# Patient Record
Sex: Male | Born: 1955 | Race: White | Hispanic: No | Marital: Married | Smoking: Former smoker
Health system: Southern US, Community
[De-identification: ages and names within clinical notes are randomized; demographics above are authoritative.]

## PROBLEM LIST (undated history)

## (undated) DIAGNOSIS — B9681 Helicobacter pylori [H. pylori] as the cause of diseases classified elsewhere: Secondary | ICD-10-CM

## (undated) DIAGNOSIS — T8859XA Other complications of anesthesia, initial encounter: Secondary | ICD-10-CM

## (undated) DIAGNOSIS — E785 Hyperlipidemia, unspecified: Secondary | ICD-10-CM

## (undated) DIAGNOSIS — M5417 Radiculopathy, lumbosacral region: Secondary | ICD-10-CM

## (undated) DIAGNOSIS — K260 Acute duodenal ulcer with hemorrhage: Secondary | ICD-10-CM

## (undated) DIAGNOSIS — J189 Pneumonia, unspecified organism: Secondary | ICD-10-CM

## (undated) DIAGNOSIS — D62 Acute posthemorrhagic anemia: Secondary | ICD-10-CM

## (undated) DIAGNOSIS — Z79891 Long term (current) use of opiate analgesic: Secondary | ICD-10-CM

## (undated) DIAGNOSIS — F1190 Opioid use, unspecified, uncomplicated: Secondary | ICD-10-CM

## (undated) DIAGNOSIS — B9689 Other specified bacterial agents as the cause of diseases classified elsewhere: Secondary | ICD-10-CM

## (undated) DIAGNOSIS — J45909 Unspecified asthma, uncomplicated: Secondary | ICD-10-CM

## (undated) DIAGNOSIS — Z1211 Encounter for screening for malignant neoplasm of colon: Secondary | ICD-10-CM

## (undated) DIAGNOSIS — I10 Essential (primary) hypertension: Secondary | ICD-10-CM

## (undated) DIAGNOSIS — K269 Duodenal ulcer, unspecified as acute or chronic, without hemorrhage or perforation: Secondary | ICD-10-CM

## (undated) DIAGNOSIS — G4730 Sleep apnea, unspecified: Secondary | ICD-10-CM

## (undated) DIAGNOSIS — M1990 Unspecified osteoarthritis, unspecified site: Secondary | ICD-10-CM

## (undated) DIAGNOSIS — N6489 Other specified disorders of breast: Secondary | ICD-10-CM

## (undated) DIAGNOSIS — A048 Other specified bacterial intestinal infections: Secondary | ICD-10-CM

## (undated) DIAGNOSIS — K2970 Gastritis, unspecified, without bleeding: Secondary | ICD-10-CM

## (undated) HISTORY — DX: Duodenal ulcer, unspecified as acute or chronic, without hemorrhage or perforation: K26.9

## (undated) HISTORY — DX: Acute duodenal ulcer with hemorrhage: K26.0

## (undated) HISTORY — DX: Essential (primary) hypertension: I10

## (undated) HISTORY — DX: Other specified bacterial agents as the cause of diseases classified elsewhere: B96.89

## (undated) HISTORY — PX: COLONOSCOPY: SHX174

## (undated) HISTORY — DX: Hyperlipidemia, unspecified: E78.5

## (undated) HISTORY — DX: Radiculopathy, lumbosacral region: M54.17

## (undated) HISTORY — DX: Other specified bacterial intestinal infections: A04.8

## (undated) HISTORY — DX: Opioid use, unspecified, uncomplicated: F11.90

## (undated) HISTORY — DX: Encounter for screening for malignant neoplasm of colon: Z12.11

## (undated) HISTORY — DX: Unspecified osteoarthritis, unspecified site: M19.90

## (undated) HISTORY — DX: Helicobacter pylori (H. pylori) as the cause of diseases classified elsewhere: K29.70

## (undated) HISTORY — DX: Helicobacter pylori (H. pylori) as the cause of diseases classified elsewhere: B96.81

## (undated) HISTORY — DX: Long term (current) use of opiate analgesic: Z79.891

---

## 2005-08-08 ENCOUNTER — Emergency Department: Payer: Self-pay | Admitting: Emergency Medicine

## 2005-08-10 ENCOUNTER — Emergency Department: Payer: Self-pay | Admitting: Emergency Medicine

## 2008-05-12 ENCOUNTER — Ambulatory Visit: Payer: Self-pay | Admitting: Orthopedic Surgery

## 2008-05-19 ENCOUNTER — Ambulatory Visit: Payer: Self-pay | Admitting: Orthopedic Surgery

## 2008-09-23 ENCOUNTER — Ambulatory Visit: Payer: Self-pay | Admitting: Neurology

## 2008-09-30 ENCOUNTER — Ambulatory Visit: Payer: Self-pay | Admitting: Pain Medicine

## 2008-10-21 ENCOUNTER — Ambulatory Visit: Payer: Self-pay | Admitting: Pain Medicine

## 2008-10-29 ENCOUNTER — Ambulatory Visit: Payer: Self-pay | Admitting: Pain Medicine

## 2008-11-11 ENCOUNTER — Ambulatory Visit: Payer: Self-pay | Admitting: Pain Medicine

## 2008-11-26 ENCOUNTER — Ambulatory Visit: Payer: Self-pay | Admitting: Pain Medicine

## 2009-04-08 ENCOUNTER — Ambulatory Visit: Payer: Self-pay | Admitting: Physician Assistant

## 2009-12-12 HISTORY — PX: ROTATOR CUFF REPAIR: SHX139

## 2010-04-13 ENCOUNTER — Ambulatory Visit: Payer: Self-pay | Admitting: Family Medicine

## 2010-04-18 ENCOUNTER — Ambulatory Visit: Payer: Self-pay | Admitting: Neurology

## 2010-05-12 ENCOUNTER — Ambulatory Visit: Payer: Self-pay | Admitting: Gastroenterology

## 2010-08-11 ENCOUNTER — Emergency Department: Payer: Self-pay | Admitting: Emergency Medicine

## 2011-12-13 HISTORY — PX: ROTATOR CUFF REPAIR: SHX139

## 2013-07-19 ENCOUNTER — Ambulatory Visit: Payer: Self-pay | Admitting: Orthopedic Surgery

## 2014-01-15 ENCOUNTER — Ambulatory Visit: Payer: Self-pay | Admitting: Orthopedic Surgery

## 2014-01-16 ENCOUNTER — Ambulatory Visit: Payer: Self-pay | Admitting: Orthopedic Surgery

## 2015-07-01 ENCOUNTER — Other Ambulatory Visit: Payer: Self-pay | Admitting: Family Medicine

## 2015-08-29 ENCOUNTER — Other Ambulatory Visit: Payer: Self-pay | Admitting: Family Medicine

## 2015-09-07 ENCOUNTER — Other Ambulatory Visit: Payer: Self-pay | Admitting: Family Medicine

## 2015-12-02 ENCOUNTER — Other Ambulatory Visit: Payer: Self-pay | Admitting: Family Medicine

## 2016-02-03 ENCOUNTER — Other Ambulatory Visit: Payer: Self-pay | Admitting: Orthopedic Surgery

## 2016-02-03 DIAGNOSIS — M5412 Radiculopathy, cervical region: Secondary | ICD-10-CM

## 2016-04-12 ENCOUNTER — Ambulatory Visit: Payer: 59 | Attending: Pain Medicine | Admitting: Pain Medicine

## 2016-04-12 ENCOUNTER — Encounter: Payer: Self-pay | Admitting: Pain Medicine

## 2016-04-12 VITALS — BP 131/71 | HR 60 | Temp 98.1°F | Resp 16 | Ht 72.0 in | Wt 240.0 lb

## 2016-04-12 DIAGNOSIS — M5382 Other specified dorsopathies, cervical region: Secondary | ICD-10-CM

## 2016-04-12 DIAGNOSIS — M48061 Spinal stenosis, lumbar region without neurogenic claudication: Secondary | ICD-10-CM

## 2016-04-12 DIAGNOSIS — I10 Essential (primary) hypertension: Secondary | ICD-10-CM | POA: Diagnosis not present

## 2016-04-12 DIAGNOSIS — M542 Cervicalgia: Secondary | ICD-10-CM | POA: Diagnosis present

## 2016-04-12 DIAGNOSIS — Q762 Congenital spondylolisthesis: Secondary | ICD-10-CM

## 2016-04-12 DIAGNOSIS — F17210 Nicotine dependence, cigarettes, uncomplicated: Secondary | ICD-10-CM | POA: Diagnosis not present

## 2016-04-12 DIAGNOSIS — M47812 Spondylosis without myelopathy or radiculopathy, cervical region: Secondary | ICD-10-CM | POA: Diagnosis not present

## 2016-04-12 DIAGNOSIS — M4802 Spinal stenosis, cervical region: Secondary | ICD-10-CM

## 2016-04-12 DIAGNOSIS — Z79899 Other long term (current) drug therapy: Secondary | ICD-10-CM | POA: Diagnosis not present

## 2016-04-12 DIAGNOSIS — Z5181 Encounter for therapeutic drug level monitoring: Secondary | ICD-10-CM

## 2016-04-12 DIAGNOSIS — R1030 Lower abdominal pain, unspecified: Secondary | ICD-10-CM

## 2016-04-12 DIAGNOSIS — M25552 Pain in left hip: Secondary | ICD-10-CM | POA: Diagnosis not present

## 2016-04-12 DIAGNOSIS — M5416 Radiculopathy, lumbar region: Secondary | ICD-10-CM

## 2016-04-12 DIAGNOSIS — R531 Weakness: Secondary | ICD-10-CM | POA: Insufficient documentation

## 2016-04-12 DIAGNOSIS — M25559 Pain in unspecified hip: Secondary | ICD-10-CM

## 2016-04-12 DIAGNOSIS — M47816 Spondylosis without myelopathy or radiculopathy, lumbar region: Secondary | ICD-10-CM

## 2016-04-12 DIAGNOSIS — M5481 Occipital neuralgia: Secondary | ICD-10-CM

## 2016-04-12 DIAGNOSIS — Z79891 Long term (current) use of opiate analgesic: Secondary | ICD-10-CM

## 2016-04-12 DIAGNOSIS — Z0189 Encounter for other specified special examinations: Secondary | ICD-10-CM

## 2016-04-12 DIAGNOSIS — M79606 Pain in leg, unspecified: Secondary | ICD-10-CM

## 2016-04-12 DIAGNOSIS — M4807 Spinal stenosis, lumbosacral region: Secondary | ICD-10-CM | POA: Insufficient documentation

## 2016-04-12 DIAGNOSIS — M549 Dorsalgia, unspecified: Secondary | ICD-10-CM | POA: Diagnosis present

## 2016-04-12 DIAGNOSIS — M62830 Muscle spasm of back: Secondary | ICD-10-CM | POA: Diagnosis not present

## 2016-04-12 DIAGNOSIS — M5417 Radiculopathy, lumbosacral region: Secondary | ICD-10-CM

## 2016-04-12 DIAGNOSIS — M79602 Pain in left arm: Secondary | ICD-10-CM

## 2016-04-12 DIAGNOSIS — E785 Hyperlipidemia, unspecified: Secondary | ICD-10-CM

## 2016-04-12 DIAGNOSIS — M25551 Pain in right hip: Secondary | ICD-10-CM | POA: Insufficient documentation

## 2016-04-12 DIAGNOSIS — R1032 Left lower quadrant pain: Secondary | ICD-10-CM | POA: Insufficient documentation

## 2016-04-12 DIAGNOSIS — R1031 Right lower quadrant pain: Secondary | ICD-10-CM | POA: Insufficient documentation

## 2016-04-12 DIAGNOSIS — M47896 Other spondylosis, lumbar region: Secondary | ICD-10-CM | POA: Insufficient documentation

## 2016-04-12 DIAGNOSIS — M533 Sacrococcygeal disorders, not elsewhere classified: Secondary | ICD-10-CM

## 2016-04-12 DIAGNOSIS — M546 Pain in thoracic spine: Secondary | ICD-10-CM

## 2016-04-12 DIAGNOSIS — M4316 Spondylolisthesis, lumbar region: Secondary | ICD-10-CM | POA: Diagnosis not present

## 2016-04-12 DIAGNOSIS — M7918 Myalgia, other site: Secondary | ICD-10-CM

## 2016-04-12 DIAGNOSIS — M4310 Spondylolisthesis, site unspecified: Secondary | ICD-10-CM

## 2016-04-12 DIAGNOSIS — F1190 Opioid use, unspecified, uncomplicated: Secondary | ICD-10-CM | POA: Diagnosis not present

## 2016-04-12 DIAGNOSIS — M79605 Pain in left leg: Secondary | ICD-10-CM | POA: Insufficient documentation

## 2016-04-12 DIAGNOSIS — M79604 Pain in right leg: Secondary | ICD-10-CM | POA: Diagnosis not present

## 2016-04-12 DIAGNOSIS — E669 Obesity, unspecified: Secondary | ICD-10-CM | POA: Diagnosis not present

## 2016-04-12 DIAGNOSIS — R200 Anesthesia of skin: Secondary | ICD-10-CM | POA: Diagnosis not present

## 2016-04-12 DIAGNOSIS — M791 Myalgia: Secondary | ICD-10-CM | POA: Diagnosis not present

## 2016-04-12 DIAGNOSIS — M545 Low back pain: Secondary | ICD-10-CM

## 2016-04-12 DIAGNOSIS — M160 Bilateral primary osteoarthritis of hip: Secondary | ICD-10-CM

## 2016-04-12 DIAGNOSIS — M4806 Spinal stenosis, lumbar region: Secondary | ICD-10-CM

## 2016-04-12 DIAGNOSIS — G4486 Cervicogenic headache: Secondary | ICD-10-CM

## 2016-04-12 DIAGNOSIS — T50905S Adverse effect of unspecified drugs, medicaments and biological substances, sequela: Secondary | ICD-10-CM

## 2016-04-12 DIAGNOSIS — R51 Headache: Secondary | ICD-10-CM

## 2016-04-12 DIAGNOSIS — M1288 Other specific arthropathies, not elsewhere classified, other specified site: Secondary | ICD-10-CM | POA: Diagnosis not present

## 2016-04-12 DIAGNOSIS — G8929 Other chronic pain: Secondary | ICD-10-CM

## 2016-04-12 DIAGNOSIS — M25511 Pain in right shoulder: Secondary | ICD-10-CM | POA: Diagnosis not present

## 2016-04-12 DIAGNOSIS — M792 Neuralgia and neuritis, unspecified: Secondary | ICD-10-CM

## 2016-04-12 DIAGNOSIS — R937 Abnormal findings on diagnostic imaging of other parts of musculoskeletal system: Secondary | ICD-10-CM

## 2016-04-12 HISTORY — DX: Radiculopathy, lumbosacral region: M54.17

## 2016-04-12 HISTORY — DX: Long term (current) use of opiate analgesic: Z79.891

## 2016-04-12 HISTORY — DX: Opioid use, unspecified, uncomplicated: F11.90

## 2016-04-12 NOTE — Assessment & Plan Note (Signed)
This is currently being treated by Dr. Normajean Glasgow, we'll apparently has done some diagnostic cervical facet blocks. The patient is clear as to what the next step is, but he indicated to me that he has been told that they would "burned the nerve". This makes sense and it would suggest that they are thinking about the possibility of radiofrequency ablation. The patient was asked if he had obtained good relief of the pain for the duration of the local anesthetics when the diagnostic cervical facet blocks were done and to myself apprise he said that he didn't. It is my guess that he simply doesn't remember since it is not likely that they would approve for any radiofrequency ablation if there is no evidence of benefit to her in the diagnostic procedure. In any case, this is being handled by Dr. Normajean Glasgow at [place].

## 2016-04-12 NOTE — Progress Notes (Signed)
Patient is here for medication management, patient states he has plans for surgery to be able to stop pain medicines.  [facility] has released him from their practice because he was seeking other medical advice for pain relief.  Safety precautions to be maintained throughout the outpatient stay will include: orient to surroundings, keep bed in low position, maintain call bell within reach at all times, provide assistance with transfer out of bed and ambulation.

## 2016-04-12 NOTE — Assessment & Plan Note (Signed)
The patient has been taking opioids since before 2011. Back then he was using fentanyl patches 12 mcg/h every 72 hours. This is the equivalent of 30 MME/Day. Now the patient is using fentanyl patches 100 mcg/h + oxycodone/APAP 10/325 one every 6 hours (40 mg/day of oxycodone). This is the equivalent of 300 MME/Day. Over the past 6 years his opioid requirements have increased tenfold. Over the years they have used the concept of "Opioid Rotation" in attempt to control his tolerance. Clearly this is an ineffective way of managing opioid tolerance. I subscribe to the concept of " Drug Holidays" to manage tolerance. Unfortunately, even though I have explained to the patient the mechanism, he disagrees with the concept and therefore has indicated that he is not about to proceed with any type of drug holidays. I have explained to the patient that this is not any negotiation and therefore I'm simply not willing to take over his medication management if we disagree with this basic concept.

## 2016-04-12 NOTE — Assessment & Plan Note (Signed)
Today I went over all of his chronic pain problems and what he is currently doing for them. The patient indicates that he was referred to [place] by Dr. Phylliss Bob ([place]) for the purpose of taking over the patient's chronic opioid medication management while he continues to have his cervical facet blocks and possibly radiofrequency ablation done by Dr. Normajean Glasgow. The patient indicates that they have also planned on doing some lumbar epidural steroid injections and possibly some sacroiliac joint injections for his lower back. At this point, I stopped the patient and I informed him that there had been a regrettable misunderstanding and that perhaps he needed to go back to Dr. Normajean Glasgow for his medication management. I explained to the patient that my specialty is interventional pain management and as a board-certified interventional pain Specialist, I was not about to manage any pain medication while somebody else is doing what I actually specialize in. I went on to complete my full evaluation and to explained to him what I would recommend for each one of the problems that he has. I informed him that Dr. Normajean Glasgow is on the right track and that he should continue with him for the management not only of the cervical spine but also the lower back. One of the things that this patient had complained about when he came into the practice today was that his care was spreaded at all over the area and he was rather difficult to get all the things that he needed done. At the end of today's appointment he indicated that he wanted me to manage his lower back and I informed him that I rather not do that precisely because he needed to keep all of his care in one place. He indicated to me that he was not sure whether or not he would continue with Dr. Normajean Glasgow since it was taking quite a bit of time to getting to have his neck treated. Once again I reminded him that because of our disagreement with regards to the "Drug  Holidays", I was not about to take over any of his medications. He indicated to me that he would be okay with that but that he still wanted to see how son I could bring him back to treat his lower back. I informed him that I had some opening cyst next Thursday and depending on whether or not his insurance company would approve it, we could end up bringing him on Thursday for a diagnostic bilateral lumbar facet block and right-sided SI joint injection.

## 2016-04-12 NOTE — Progress Notes (Signed)
Patient's Name: Carl Burke  Patient type: New patient  MRN: [id number]  Service setting: Ambulatory outpatient  DOB: Nov 09, 1956  Location: [facility]  DOS: 04/12/2016  Primary Care Physician: Ashok Norris, MD  Note by: Kathlen Brunswick. Dossie Arbour, M.D, DABA, DABAPM, DABPM, DABIPP, FIPP  Referring Physician: Phylliss Bob, MD  Specialty: Board-Certified Interventional Pain Management     Primary Reason(s) for Visit: Initial Patient Evaluation CC: Back Pain; Neck Pain; and Leg Pain   HPI  Carl Burke is a 60 y.o. year old, male patient, who comes today for an initial evaluation. 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); Lumbar facet syndrome (Bilateral) (L>R); Lumbar spondylosis; Grade 1 Anterolisthesis of L4 over L5; Lumbar facet arthropathy; Chronic sacroiliac joint pain (Right); Chronic hip pain (Bilateral); Muscle spasm of back; Cervicogenic headache (Location of Primary Source of Pain) (Bilateral) (R>L); Chronic occipital neuralgia (Location of Primary Source of Pain) (Bilateral) (R>L); Chronic upper back pain (Location of Secondary source of pain) (Bilateral) (midline); Chronic neck pain (Location of Tertiary source of pain) (Bilateral) (R>L); Cervical facet syndrome (Location of Tertiary source of pain) (Bilateral) (R>L); Chronic low back pain (Bilateral) (L>R); Chronic groin pain (Bilateral) (L>R); 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 His primarily concern today is the Back Pain; Neck Pain; and Leg Pain   Pain Assessment: Self-Reported Pain Score: 5  Reported level of pain is compatible with clinical observations Pain Type: Chronic pain Pain Location: Back (neck, legs) Pain Orientation: Lower, Upper (right and bilateral) Pain Descriptors / Indicators: Constant, Burning, Numbness, Sharp Pain Frequency: Constant  Onset and Duration: Gradual, Date of onset: More than 7 years ago and Present longer than 3 months Cause of pain: Fell from the ladder Severity: Getting worse, NAS-11 at its worse: 9/10, NAS-11 at its best: 4/10, NAS-11 now: 6-7/10 and NAS-11 on the average: 4-5/10 Timing: Not influenced by the time of the day and During activity or exercise Aggravating Factors: Bending, Climbing, Kneeling, Lifiting, Motion, Prolonged sitting, Prolonged standing, Squatting, Stooping , Twisting, Walking, Walking uphill and Working Alleviating Factors: Cold packs, Hot packs and Medications Associated Problems: Inability to concentrate, Numbness, Personality changes, Spasms, Sweating, Swelling, Temperature changes, Tingling, Weakness, Pain that wakes patient up and Pain that does not allow patient to sleep Quality of Pain: Aching, Agonizing, Annoying, Burning, Cramping, Dull, Exhausting, Pulsating, Sharp, Shooting, Sickening, Splitting, Stabbing, Tender, Toothache-like and Uncomfortable Previous Examinations or Tests: EMG/PNCV, MRI scan, Nerve conduction test, Neurological evaluation and Orthoperdic evaluation Previous Treatments: Epidural steroid injections, Facet blocks, Narcotic medications, Physical Therapy, Pool exercises, Stretching exercises, TENS, Traction and Trigger point injections  The patient comes to  the clinics today for the first time referred to [place] in consultation by Dr. Phylliss Bob. He comes in accompanied by his wife. The patient indicates his primary source of pain to be that of the headaches primarily in the right side and occipital region followed by the upper back pain in the midline, between the  shoulder blades. Next he describes the neck pain to be the worst, also bilateral but with the right being worse than the left. This is followed by the low back pain which is also bilateral and worse on the left side. Next is the bilateral groin pain with the left being worse than the right. This is followed by his bilateral lower extremity pain with the left being worst on the right. In terms of the leg pain this goes all the way down to his feet and following what appears to be a dermatomal distribution. The pain goes all the way down to the top and lateral aspect of the foot in what seems to be an L5/S1 dermatomal distribution. The distribution on the right leg is a identical to the one on the left but to a lesser degree. Next is his left arm pain which he describes as intermittent and it stops before he gets into the wrist. The patient indicates that the management of his chronic pain has been compartmentalized and he is not very happy with this. He indicates that he was initially getting his management at [facility], but because nothing was happening in a timely manner, he went over to the [facility] where he met Dr. Lynann Bologna and Dr. Mina Marble. At this point he appears to be getting his pain medications that one practice and his interventional therapy at another. He described this as "a mess".  Pharmacological pain management: The patient's pain medications are currently being managed by Dr. Cannon Kettle (a board certified DO in [place], working for [employer], [place] in [place], [place]). The patient is currently  managing his pain with Fentanyl patch 100 g per hour every 72 hours + oxycodone/APAP 10/325 one every 6 hours (40 mg/day). This represents a 300 MME/Day equivalent. Review of the patient's PMP reveals that he has been on opioids since before 2011. His opioid use has increased tenfold since then. This would suggest the development of significant opioid tolerance. During today's appointment I went over this issue with the patient and informed him that I use the concept of "Drug Holidays" to treat tolerance. He understands how this works and has clearly expressed his disagreement with this type of management. I informed the patient that this was not an association and that I was simply informing him on what I would be doing if he decided to come on board are medication management program. He disagreed with this and therefore we have mutually decided not to include this option in his possible treatment plan. To state this in a more clear manner, I will not be taking over this patient's medication management.  Interventional pain management evaluation: The patient indicates that in the past he has had some SI joint injections done by the orthopedic group in [place], [facility] [employer] [facility]). In addition he has revealed that he is currently receiving cervical facet injection treatments by Dr. Normajean Glasgow, at the [facility]. Since Dr. Mina Marble works with Dr. Lynann Bologna, I thought it would be strange that he will be referring the patient to me so I asked the patient what he had been told when he was sent over to see me. The patient indicated that Dr. Lynann Bologna wanted me to take over the medication management while they continued to do the injection therapy since they did not write for chronic pain medications. I was a little surprised about this and so I explained to the patient that  this was not going to happen. I informed the patient that I am a board-certified interventional pain Specialist and that  under no circumstances I would be writing for pain medications and taking up this liability while somebody else was doing what I actually specialize in. At this point I offered to terminate the consult but they indicated that they were interested in having a full evaluation and getting my opinion as to what his alternatives were. I reviewed all of the available data and I recommended that he continue with Dr. Mina Marble with the previously specified treatment plan. I also evaluated the patient's lower back problems and I explained to him that his problems are multifactorial. I informed him that his radicular symptoms are likely to be secondary to constriction within the spinal canal and/or neural foramina. Based on my evaluation I informed him that his low back pain was likely to be secondary to a combination of lumbar facet and sacroiliac joint arthropathy. I also informed him that based on the Patrick's maneuver, he also had some strong evidence of having problems with both of his hip joints. For the low back pain I suggested a similar approach to that of the cervical facets with diagnostic lumbar facet medial branch blocks, which if effective, may lead to radiofrequency ablation. In addition I recommended a possible series of lumbar epidural steroid injections and/or transforaminal epidural steroid injections for the lower extremity pain. Finally, I suggested the possibility of some x-rays of the hip joints followed by possible diagnostic intra-articular hip joint injections.  Medical management: Because the patient has a BMI of 32.54, I suggested that he start working with loosing weight and to use a BMI of 29, as his target goal. Currently he is a class I obese patient with a 68% higher incidence of low back pain secondary to his weight alone.  To my surprise and despite the fact that I had informed the patient that I would not be taking over his medication management, he has requested that I start treating his  lumbar region. I informed him that I did not think that this was a good idea. I told him that the interventional management should be conducted by only 1 physician and since he is already seen Dr. Mina Marble, he should stay with him. He was very assertive in indicating that he wanted me to take care of his lower back. I again explained in more detail why it was not a good idea to have more than one interventional pain physician since it was likely that multiple interventions with steroids could lead to an adrenal suppression if done too close together or with too high doses. Despite the fact that he indicated having understood, he insisted that I scale him for the lumbar diagnostic injections here and that he would talk to Dr. Mina Marble about his cervical choices and he would then decide who he wanted to stay with. I do not particularly like doing things this way, but when he asked me how someone I could schedule him I couldn't just ignore him or light to him. I informed him that the soonest I could get him in would be this next Thursday and at this point he asked me to proceed with scheduling him for that date.  Historic Controlled Substance Pharmacotherapy Review  Previously Prescribed Opioids: Fentanyl patches, Nucynta, oxycodone, OxyContin, and hydromorphone. He has been on opioids on a regular basis since before 04/09/2010. This is as far back as we get with the PMP. Currently Prescribed  Analgesic: Fentanyl patch 100 g per hour every 72 hours + oxycodone/APAP 10/325 one every 6 hours (40 mg/day). Medications: Patient brought medications to be checked, as requested MME/day: 300 mg/day Pharmacodynamics: Analgesic Effect: More than 50% Activity Facilitation: Medication(s) allow patient to sit, stand, walk, and do the basic ADLs Perceived Effectiveness: Described as relatively effective, allowing for increase in activities of daily living (ADL) Side-effects or Adverse reactions: None reported Historical  Background Evaluation: [place] PDMP: Five (5) year initial data search conducted. No abnormal patterns identified [place] [employer] Offender Public Information: Non-contributory Historical Hospital-associated UDS Results:  No results found for: THCU, COCAINSCRNUR, PCPSCRNUR, MDMA, AMPHETMU, METHADONE, ETOH UDS Results: No UDS results available at this time UDS Interpretation: N/A Medication Assessment Form: Not applicable. Initial evaluation. The patient has not received any medications from our practice Treatment compliance: Not applicable. Initial evaluation Risk Assessment: Aberrant Behavior: inability to consider abstinence Opioid Fatal Overdose Risk Factors: Male gender, Caucasian and High daily dosage Non-fatal overdose hazard ratio (HR): 2.88 for doses equal to, or higher than 200 MME/day Fatal overdose hazard ratio (HR): 2.04 for doses equal to, or higher than 100 MME/day Substance Use Disorder (SUD) Risk Level: We will not be ordering of medical psychology elevation for substance use disorder since we have agreed that we will not be taking over this patient's medication regimen. Opioid Risk Tool (ORT) Score: Total Score: 4 Moderate Risk for SUD (Score between 4-7) Depression Scale Score: PHQ-2: PHQ-2 Total Score: 0 No depression (0) PHQ-9: PHQ-9 Total Score: 0 No depression (0-4)  Pharmacologic Plan: The patient has indicated not agreeing with my management of tolerance and therefore we have mutually decided not to participate in the pharmacological management of his condition.  Meds  The patient has a current medication list which includes the following prescription(s): amlodipine-benazepril, fenofibrate, fentanyl, oxycodone-acetaminophen, simvastatin, tizanidine, and zolpidem.  ROS  Cardiovascular History: Hypertension and Chest pain Pulmonary or Respiratory History: Snoring  Neurological History: Negative for epilepsy, stroke, urinary or fecal inontinence, spina bifida  or tethered cord syndrome Review of Past Neurological Studies:  No results found for this or any previous visit. Psychological-Psychiatric History: Negative for anxiety, depression, schizophrenia, bipolar disorders or suicidal ideations or attempts Gastrointestinal History: Negative for peptic ulcer disease, hiatal hernia, GERD, IBS, hepatitis, cirrhosis or pancreatitis Genitourinary History: Negative for nephrolithiasis, hematuria, renal failure or chronic kidney disease Hematological History: Negative for anticoagulant therapy, anemia, bruising or bleeding easily, hemophilia, sickle cell disease or trait, thrombocytopenia or coagulupathies Endocrine History: Negative for diabetes or thyroid disease Rheumatologic History: Negative for lupus, osteoarthritis, rheumatoid arthritis, myositis, polymyositis or fibromyagia Musculoskeletal History: Negative for myasthenia gravis, muscular dystrophy, multiple sclerosis or malignant hyperthermia Work History: Disabled for the past 5 years.  Allergies  Carl Burke is allergic to naproxen sodium.  [place]  Medical:  Carl Burke  has a past medical history of Arthritis; Hyperlipidemia; Hypertension; Opiate use (04/12/2016); and Lumbosacral radiculopathy at S1 (Left) (04/12/2016). Family: family history includes Cancer in his sister and sister; Heart disease in his mother. Surgical:  has past surgical history that includes Rotator cuff repair (Left, 2011) and Rotator cuff repair (Right, 2013). Tobacco:  reports that he has been smoking Cigarettes.  He has been smoking about 0.20 packs per day. He does not have any smokeless tobacco history on file. Alcohol:  reports that he drinks alcohol. Drug:  reports that he does not use illicit drugs. Active Ambulatory Problems    Diagnosis Date Noted  . Chronic pain 04/12/2016  .  Long term current use of opiate analgesic 04/12/2016  . Long term prescription opiate use 04/12/2016  . Opiate use (300 MME/Day) 04/12/2016   . Encounter for therapeutic drug level monitoring 04/12/2016  . Encounter for pain management planning 04/12/2016  . Neurogenic pain 04/12/2016  . Musculoskeletal pain 04/12/2016  . Chronic shoulder pain (Right) 04/12/2016  . Cervical spondylosis 04/12/2016  . Cervical foraminal stenosis (C6-7) (Right) 04/12/2016  . 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, L3-4 and L5-S1) (Left) 04/12/2016  . Lumbar facet syndrome (Bilateral) (L>R) 04/12/2016  . Lumbar spondylosis 04/12/2016  . Grade 1 Anterolisthesis of L4 over L5 04/12/2016  . Lumbar facet arthropathy 04/12/2016  . Chronic sacroiliac joint pain (Right) 04/12/2016  . Chronic hip pain (Bilateral) 04/12/2016  . Muscle spasm of back 04/12/2016  . Cervicogenic headache (Location of Primary Source of Pain) (Bilateral) (R>L) 04/12/2016  . Chronic occipital neuralgia (Location of Primary Source of Pain) (Bilateral) (R>L) 04/12/2016  . Chronic upper back pain (Location of Secondary source of pain) (Bilateral) (midline) 04/12/2016  . Chronic neck pain (Location of Tertiary source of pain) (Bilateral) (R>L) 04/12/2016  . Cervical facet syndrome (Location of Tertiary source of pain) (Bilateral) (R>L) 04/12/2016  . Chronic low back pain (Bilateral) (L>R) 04/12/2016  . Chronic groin pain (Bilateral) (L>R) 04/12/2016  . 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 Chronic lower extremity pain (Bilateral) (L>R) 04/12/2016  . Chronic lumbar radicular pain (L5/S1 dermatomal distribution) (Bilateral) (L>R) 04/12/2016  . Chronic upper extremity pain (intermittent) (Left) 04/12/2016  . Essential hypertension 04/12/2016  . Hyperlipidemia 04/12/2016  . High opioid Drug tolerance 04/12/2016  . Abnormal MRI, cervical spine (02/05/2016) 04/12/2016  . Abnormal MRI, lumbar spine (01/15/2016) 04/12/2016  . Lumbosacral radiculopathy at S1 (Left) 04/12/2016   Resolved Ambulatory Problems    Diagnosis Date Noted  . No Resolved Ambulatory Problems   Past  Medical History  Diagnosis Date  . Arthritis   . Hypertension     Constitutional Exam  Vitals: Blood pressure 131/71, pulse 60, temperature 98.1 F (36.7 C), temperature source Oral, resp. rate 16, height 6' (1.829 m), weight 240 lb (108.863 kg), SpO2 97 %. General appearance: Well nourished, well developed, and well hydrated. In no acute distress Calculated BMI/Body habitus: Body mass index is 32.54 kg/(m^2). (30-34.9 kg/m2) Obese (Class I) - 68% higher incidence of chronic pain Psych/Mental status: Alert and oriented x 3 (person, place, & time) Eyes: PERLA Respiratory: No evidence of acute respiratory distress  Cervical Spine Exam  Inspection: No masses, redness, or swelling Alignment: Symmetrical ROM: Functional: Limited ROM Active: Decreased ROM Stability: No instability detected Muscle strength & Tone: Functionally intact Sensory: Unimpaired Palpation: Tender  Upper Extremity (UE) Exam    Side: Right upper extremity  Side: Left upper extremity  Inspection: No masses, redness, swelling, or asymmetry  Inspection: No masses, redness, swelling, or asymmetry  ROM:  ROM:  Functional: Limited ROM for the shoulder   Functional: Limited ROM for the shoulder   Active: Decreased ROM for the shoulder   Active: Decreased ROM for the shoulder   Muscle strength & Tone: Functionally intact  Muscle strength & Tone: Functionally intact  Sensory: Unimpaired  Sensory: Unimpaired  Palpation: Non-contributory  Palpation: Tender   Thoracic Spine Exam  Inspection: No masses, redness, or swelling Alignment: Symmetrical ROM: Functional: Unrestricted ROM Active: Adequate ROM Stability: No instability detected Sensory: Unimpaired Muscle strength & Tone: Functionally intact Palpation: Tender between the shoulder blades  Lumbar Spine Exam  Inspection: No masses, redness, or swelling  Alignment: Symmetrical ROM: Functional: Limited ROM Active: Decreased ROM Stability: No instability  detected Muscle strength & Tone: Functionally intact but with increased tone Sensory: Unimpaired Palpation: Tender Provocative Tests: Lumbar Hyperextension and rotation test: Positive bilaterally for pain coming from the facet joints. Patrick's Maneuver: Positive bilaterally for hip joint pain and positive on the right side for sacroiliac joint pain.  Gait & Posture Assessment  Gait: Antalgic gait (limping) Posture: Antalgic  Lower Extremity Exam    Side: Right lower extremity  Side: Left lower extremity  Inspection: No masses, redness, swelling, or asymmetry ROM:  Inspection: No masses, redness, swelling, or asymmetry ROM:  Functional: Limited ROM for the hip joint   Functional: Limited ROM for the hip joint   Active: Decreased ROM for the hip joint   Active: Decreased ROM for the hip joint   Muscle strength & Tone: Functionally intact  Muscle strength & Tone: Functionally intact  Sensory: Unimpaired  Sensory: Unimpaired  Palpation: Non-contributory  The patient was able to heel walk and toe walk without any problems on the right side.   Palpation: Non-contributory  The patient was unable to toe walk with the left leg suggesting an S1 radiculopathy.    Assessment  Primary Diagnosis & Pertinent Problem List: The primary encounter diagnosis was Chronic pain. 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) (Left), Lumbar facet syndrome (Bilateral) (L>R), Lumbar spondylosis, unspecified spinal osteoarthritis, Grade 1 Anterolisthesis of L4 over L5, Lumbar facet arthropathy, Chronic sacroiliac joint pain (Right), Chronic hip pain, unspecified laterality, Muscle spasm of  back, Cervicogenic headache (Location of Primary Source of Pain) (Bilateral) (R>L), Chronic occipital neuralgia (Location of Primary Source of Pain) (Bilateral) (R>L), Chronic upper back pain (Location of Secondary source of pain) (Bilateral) (midline), Chronic neck pain (Location of Tertiary source of pain) (Bilateral) (R>L), Cervical facet syndrome (Location of Tertiary source of pain) (Bilateral) (R>L), Chronic low back pain (Bilateral) (L>R), Chronic groin pain, unspecified laterality, Primary osteoarthritis of both hips, Chronic pain of lower extremity, unspecified laterality, Chronic lumbar radicular pain (L5/S1 dermatomal distribution) (Bilateral) (L>R), Chronic upper extremity pain (intermittent) (Left), Essential hypertension, Hyperlipidemia, Drug tolerance, sequela, Abnormal MRI, cervical spine (02/05/2016), Abnormal MRI, lumbar spine (01/15/2016), and Lumbosacral radiculopathy at S1 (Left) were also pertinent to this visit.  Visit Diagnosis: 1. Chronic pain   2. Long term current use of opiate analgesic   3. Long term prescription opiate use   4. Opiate use   5. Encounter for therapeutic drug level monitoring   6. Encounter for pain management planning   7. Neurogenic pain   8. Musculoskeletal pain   9. Chronic shoulder pain (Right)   10. Cervical spondylosis   11. Cervical foraminal stenosis (C6-7) (Right)   12. 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, L3-4 and L5-S1) (Left)   13. Lumbar facet syndrome (Bilateral) (L>R)   14. Lumbar spondylosis, unspecified spinal osteoarthritis   15. Grade 1 Anterolisthesis of L4 over L5   16. Lumbar facet arthropathy   17. Chronic sacroiliac joint pain (Right)   18. Chronic hip pain, unspecified laterality   19. Muscle spasm of back   20. Cervicogenic headache (Location of Primary Source of Pain) (Bilateral) (R>L)   21. Chronic occipital neuralgia (Location of Primary Source of Pain) (Bilateral) (R>L)  22. Chronic upper back pain (Location of Secondary  source of pain) (Bilateral) (midline)   23. Chronic neck pain (Location of Tertiary source of pain) (Bilateral) (R>L)   24. Cervical facet syndrome (Location of Tertiary source of pain) (Bilateral) (R>L)   25. Chronic low back pain (Bilateral) (L>R)   26. Chronic groin pain, unspecified laterality   27. Primary osteoarthritis of both hips   28. Chronic pain of lower extremity, unspecified laterality   29. Chronic lumbar radicular pain (L5/S1 dermatomal distribution) (Bilateral) (L>R)   30. Chronic upper extremity pain (intermittent) (Left)   31. Essential hypertension   32. Hyperlipidemia   33. Drug tolerance, sequela   34. Abnormal MRI, cervical spine (02/05/2016)   35. Abnormal MRI, lumbar spine (01/15/2016)   36. Lumbosacral radiculopathy at S1 (Left)     Assessment: High opioid Drug tolerance The patient has been taking opioids since before 2011. Back then he was using fentanyl patches 12 mcg/h every 72 hours. This is the equivalent of 30 MME/Day. Now the patient is using fentanyl patches 100 mcg/h + oxycodone/APAP 10/325 one every 6 hours (40 mg/day of oxycodone). This is the equivalent of 300 MME/Day. Over the past 6 years his opioid requirements have increased tenfold. Over the years they have used the concept of "Opioid Rotation" in attempt to control his tolerance. Clearly this is an ineffective way of managing opioid tolerance. I subscribe to the concept of " Drug Holidays" to manage tolerance. Unfortunately, even though I have explained to the patient the mechanism, he disagrees with the concept and therefore has indicated that he is not about to proceed with any type of drug holidays. I have explained to the patient that this is not any negotiation and therefore I'm simply not willing to take over his medication management if we disagree with this basic concept.  Encounter for pain management planning Today I went over all of his chronic pain problems and what he is currently doing  for them. The patient indicates that he was referred to [place] by Dr. Phylliss Bob ([place]) for the purpose of taking over the patient's chronic opioid medication management while he continues to have his cervical facet blocks and possibly radiofrequency ablation done by Dr. Normajean Glasgow. The patient indicates that they have also planned on doing some lumbar epidural steroid injections and possibly some sacroiliac joint injections for his lower back. At this point, I stopped the patient and I informed him that there had been a regrettable misunderstanding and that perhaps he needed to go back to Dr. Normajean Glasgow for his medication management. I explained to the patient that my specialty is interventional pain management and as a board-certified interventional pain Specialist, I was not about to manage any pain medication while somebody else is doing what I actually specialize in. I went on to complete my full evaluation and to explained to him what I would recommend for each one of the problems that he has. I informed him that Dr. Normajean Glasgow is on the right track and that he should continue with him for the management not only of the cervical spine but also the lower back. One of the things that this patient had complained about when he came into the practice today was that his care was spreaded at all over the area and he was rather difficult to get all the things that he needed done. At the end of today's appointment he indicated that he wanted me to manage his lower back  and I informed him that I rather not do that precisely because he needed to keep all of his care in one place. He indicated to me that he was not sure whether or not he would continue with Dr. Normajean Glasgow since it was taking quite a bit of time to getting to have his neck treated. Once again I reminded him that because of our disagreement with regards to the "Drug Holidays", I was not about to take over any of his medications. He indicated to me  that he would be okay with that but that he still wanted to see how son I could bring him back to treat his lower back. I informed him that I had some opening cyst next Thursday and depending on whether or not his insurance company would approve it, we could end up bringing him on Thursday for a diagnostic bilateral lumbar facet block and right-sided SI joint injection.  Cervical facet syndrome (Location of Tertiary source of pain) (Bilateral) (R>L) This is currently being treated by Dr. Normajean Glasgow, we'll apparently has done some diagnostic cervical facet blocks. The patient is clear as to what the next step is, but he indicated to me that he has been told that they would "burned the nerve". This makes sense and it would suggest that they are thinking about the possibility of radiofrequency ablation. The patient was asked if he had obtained good relief of the pain for the duration of the local anesthetics when the diagnostic cervical facet blocks were done and to myself apprise he said that he didn't. It is my guess that he simply doesn't remember since it is not likely that they would approve for any radiofrequency ablation if there is no evidence of benefit to her in the diagnostic procedure. In any case, this is being handled by Dr. Normajean Glasgow at [place].  Abnormal MRI, cervical spine (02/05/2016) MRI cervical spine without contrast: FINDINGS: Vertebral bodies: Normal height and alignment.  Marrow signal: No significant abnormality. Cervical spinal cord: No mass or abnormal signal intensity.  No spinal cord compression. Craniovertebral junction: Normal.  C2-3: Mild facet joint arthritis on the left. C3-4: Moderate facet joint arthritis on the left. C4-5: Mild degenerative disc disease. Mild facet joint arthritis on the left. C5-6: Mild degenerative disc disease. C6-7: Moderate degenerative disc disease with posterior spurring. Moderate bilateral foraminal  stenosis. C7-T1: Moderate facet joint arthritis on the right. 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  Abnormal MRI, lumbar spine (01/15/2016) EXAM: MR-Lumbar Spine without contrast FINDINGS: Segmentation: Numbering in this report assumes five standard vertebral segments. Conus Medullaris: Grossly normal size and contour of the conus. Vertebral Column: No marrow abnormality of acute significance. Disc desiccation primarily at the L1-L2 and L5-S1 levels. Mild Modic I discogenic marrow changes subjacent to the L2 superior endplate anteriorly. Modic II discogenic marrow changes on either side of the L5-S1 disc space to the right of midline. Modic II discogenic marrow changes adjacent to the superior endplate of L3. Right-sided L2 vertebral body hemangioma is incidentally noted. There may be trace anterolisthesis at L4-5. L1-2: Mild circumferential disc bulge with negligible impression on the thecal sac. No central canal or foraminal stenosis. L2-3: No significant disc bulge or focal disc herniation. No central canal or foraminal stenosis. L3-4: No significant disc bulge or focal disc herniation. No central canal or foraminal stenosis. L4-5: Minimal disc bulge without significant mass effect on the thecal sac. Chronic facet arthropathy  produces dorsal encroachment on the medial aspect of each L4 foramen, possibly with mild compression of the L4 nerve roots as they enter the foramina. No central canal stenosis. L5-S1: Chronic circumferential disc bulge and osteophyte without significant mass effect on the thecal sac. Chronic facet DJD is also present. However, there is no central canal or foraminal stenosis at this level. 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 There is narrowing of the medial aspect of each L4 foramen, primarily related to facet arthropathy.    Plan of Care  Initial Treatment Plan:  Please  be advised that as per protocol, today's visit has been an evaluation only. We have not taken over the patient's controlled substance management. In addition, due to our disagreement with regards to the management of opiate tolerance, the patient has opted not to participate in our medication management program. We will assume that he will continue getting his medications from the same physician that has been writing them up to now.  Problem List Items Addressed This Visit      High   Abnormal MRI, cervical spine (02/05/2016)    MRI cervical spine without contrast: FINDINGS: Vertebral bodies: Normal height and alignment.  Marrow signal: No significant abnormality. Cervical spinal cord: No mass or abnormal signal intensity.  No spinal cord compression. Craniovertebral junction: Normal.  C2-3: Mild facet joint arthritis on the left. C3-4: Moderate facet joint arthritis on the left. C4-5: Mild degenerative disc disease. Mild facet joint arthritis on the left. C5-6: Mild degenerative disc disease. C6-7: Moderate degenerative disc disease with posterior spurring. Moderate bilateral foraminal stenosis. C7-T1: Moderate facet joint arthritis on the right. 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      Abnormal MRI, lumbar spine (01/15/2016)    EXAM: MR-Lumbar Spine without contrast FINDINGS: Segmentation: Numbering in this report assumes five standard vertebral segments. Conus Medullaris: Grossly normal size and contour of the conus. Vertebral Column: No marrow abnormality of acute significance. Disc desiccation primarily at the L1-L2 and L5-S1 levels. Mild Modic I discogenic marrow changes subjacent to the L2 superior endplate anteriorly. Modic II discogenic marrow changes on either side of the L5-S1 disc space to the right of midline. Modic II discogenic marrow changes adjacent to the superior endplate of L3. Right-sided L2 vertebral body  hemangioma is incidentally noted. There may be trace anterolisthesis at L4-5. L1-2: Mild circumferential disc bulge with negligible impression on the thecal sac. No central canal or foraminal stenosis. L2-3: No significant disc bulge or focal disc herniation. No central canal or foraminal stenosis. L3-4: No significant disc bulge or focal disc herniation. No central canal or foraminal stenosis. L4-5: Minimal disc bulge without significant mass effect on the thecal sac. Chronic facet arthropathy produces dorsal encroachment on the medial aspect of each L4 foramen, possibly with mild compression of the L4 nerve roots as they enter the foramina. No central canal stenosis. L5-S1: Chronic circumferential disc bulge and osteophyte without significant mass effect on the thecal sac. Chronic facet DJD is also present. However, there is no central canal or foraminal stenosis at this level. 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 There is narrowing of the medial aspect of each L4 foramen, primarily related to facet arthropathy.      Cervical facet syndrome (Location of Tertiary source of pain) (Bilateral) (R>L) (Chronic)    This is currently being treated by Dr. Normajean Glasgow, we'll apparently has done some diagnostic cervical facet  blocks. The patient is clear as to what the next step is, but he indicated to me that he has been told that they would "burned the nerve". This makes sense and it would suggest that they are thinking about the possibility of radiofrequency ablation. The patient was asked if he had obtained good relief of the pain for the duration of the local anesthetics when the diagnostic cervical facet blocks were done and to myself apprise he said that he didn't. It is my guess that he simply doesn't remember since it is not likely that they would approve for any radiofrequency ablation if there is no evidence of benefit to her in the diagnostic procedure. In any  case, this is being handled by Dr. Normajean Glasgow at [place].      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)   Cervical spondylosis (Chronic)   Relevant Medications   tiZANidine (ZANAFLEX) 4 MG tablet   oxyCODONE-acetaminophen (PERCOCET) 10-325 MG tablet   fentaNYL (DURAGESIC - DOSED MCG/HR) 100 MCG/HR   Cervicogenic headache (Location of Primary Source of Pain) (Bilateral) (R>L) (Chronic)   Relevant Medications   tiZANidine (ZANAFLEX) 4 MG tablet   oxyCODONE-acetaminophen (PERCOCET) 10-325 MG tablet   fentaNYL (DURAGESIC - DOSED MCG/HR) 100 MCG/HR   Chronic groin pain (Bilateral) (L>R) (Chronic)   Relevant Medications   tiZANidine (ZANAFLEX) 4 MG tablet   oxyCODONE-acetaminophen (PERCOCET) 10-325 MG tablet   fentaNYL (DURAGESIC - DOSED MCG/HR) 100 MCG/HR   Chronic hip pain (Bilateral) (Chronic)   Relevant Medications   tiZANidine (ZANAFLEX) 4 MG tablet   oxyCODONE-acetaminophen (PERCOCET) 10-325 MG tablet   fentaNYL (DURAGESIC - DOSED MCG/HR) 100 MCG/HR   Chronic low back pain (Bilateral) (L>R) (Chronic)   Relevant Medications   tiZANidine (ZANAFLEX) 4 MG tablet   oxyCODONE-acetaminophen (PERCOCET) 10-325 MG tablet   fentaNYL (DURAGESIC - DOSED MCG/HR) 100 MCG/HR   Chronic lower extremity pain (Bilateral) (L>R) (Chronic)   Chronic lumbar radicular pain (L5/S1 dermatomal distribution) (Bilateral) (L>R) (Chronic)   Chronic neck pain (Location of Tertiary source of pain) (Bilateral) (R>L) (Chronic)   Relevant Medications   tiZANidine (ZANAFLEX) 4 MG tablet   oxyCODONE-acetaminophen (PERCOCET) 10-325 MG tablet   fentaNYL (DURAGESIC - DOSED MCG/HR) 100 MCG/HR   Chronic occipital neuralgia (Location of Primary Source of Pain) (Bilateral) (R>L) (Chronic)   Relevant Medications   tiZANidine (ZANAFLEX) 4 MG tablet   oxyCODONE-acetaminophen (PERCOCET) 10-325 MG tablet   fentaNYL (DURAGESIC - DOSED MCG/HR) 100 MCG/HR   Chronic pain - Primary  (Chronic)   Relevant Medications   tiZANidine (ZANAFLEX) 4 MG tablet   oxyCODONE-acetaminophen (PERCOCET) 10-325 MG tablet   fentaNYL (DURAGESIC - DOSED MCG/HR) 100 MCG/HR   Other Relevant Orders   Comprehensive metabolic panel   C-reactive protein   Magnesium   Sedimentation rate   Vitamin B12   Vitamin D 1,25 dihydroxy   Chronic sacroiliac joint pain (Right) (Chronic)   Relevant Medications   tiZANidine (ZANAFLEX) 4 MG tablet   oxyCODONE-acetaminophen (PERCOCET) 10-325 MG tablet   fentaNYL (DURAGESIC - DOSED MCG/HR) 100 MCG/HR   Other Relevant Orders   SACROILIAC JOINT INJECTINS   Chronic shoulder pain (Right) (Chronic)   Chronic upper back pain (Location of Secondary source of pain) (Bilateral) (midline) (Chronic)   Relevant Medications   tiZANidine (ZANAFLEX) 4 MG tablet   oxyCODONE-acetaminophen (PERCOCET) 10-325 MG tablet   fentaNYL (DURAGESIC - DOSED MCG/HR) 100 MCG/HR   Chronic upper extremity pain (intermittent) (Left) (Chronic)   Relevant Medications  tiZANidine (ZANAFLEX) 4 MG tablet   oxyCODONE-acetaminophen (PERCOCET) 10-325 MG tablet   fentaNYL (DURAGESIC - DOSED MCG/HR) 100 MCG/HR   Grade 1 Anterolisthesis of L4 over L5 (Chronic)   Lumbar facet arthropathy (Chronic)   Relevant Orders   LUMBAR FACET(MEDIAL BRANCH NERVE BLOCK) MBNB   Lumbar facet syndrome (Bilateral) (L>R) (Chronic)   Relevant Medications   tiZANidine (ZANAFLEX) 4 MG tablet   oxyCODONE-acetaminophen (PERCOCET) 10-325 MG tablet   fentaNYL (DURAGESIC - DOSED MCG/HR) 100 MCG/HR   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, 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)   Lumbar spondylosis (Chronic)   Relevant Medications   tiZANidine (ZANAFLEX) 4 MG tablet   oxyCODONE-acetaminophen (PERCOCET) 10-325 MG tablet   fentaNYL (DURAGESIC - DOSED MCG/HR) 100 MCG/HR   Lumbosacral radiculopathy at S1 (Left) (Chronic)   Relevant Medications   tiZANidine (ZANAFLEX) 4 MG tablet   zolpidem (AMBIEN CR) 6.25 MG CR tablet   Muscle  spasm of back (Chronic)   Musculoskeletal pain (Chronic)   Neurogenic pain (Chronic)   Osteoarthritis of hips (Bilateral) (L>R) (Chronic)   Relevant Medications   tiZANidine (ZANAFLEX) 4 MG tablet   oxyCODONE-acetaminophen (PERCOCET) 10-325 MG tablet   fentaNYL (DURAGESIC - DOSED MCG/HR) 100 MCG/HR     Medium   Encounter for pain management planning    Today I went over all of his chronic pain problems and what he is currently doing for them. The patient indicates that he was referred to [place] by Dr. Phylliss Bob ([place]) for the purpose of taking over the patient's chronic opioid medication management while he continues to have his cervical facet blocks and possibly radiofrequency ablation done by Dr. Normajean Glasgow. The patient indicates that they have also planned on doing some lumbar epidural steroid injections and possibly some sacroiliac joint injections for his lower back. At this point, I stopped the patient and I informed him that there had been a regrettable misunderstanding and that perhaps he needed to go back to Dr. Normajean Glasgow for his medication management. I explained to the patient that my specialty is interventional pain management and as a board-certified interventional pain Specialist, I was not about to manage any pain medication while somebody else is doing what I actually specialize in. I went on to complete my full evaluation and to explained to him what I would recommend for each one of the problems that he has. I informed him that Dr. Normajean Glasgow is on the right track and that he should continue with him for the management not only of the cervical spine but also the lower back. One of the things that this patient had complained about when he came into the practice today was that his care was spreaded at all over the area and he was rather difficult to get all the things that he needed done. At the end of today's appointment he indicated that he wanted me to manage his lower back  and I informed him that I rather not do that precisely because he needed to keep all of his care in one place. He indicated to me that he was not sure whether or not he would continue with Dr. Normajean Glasgow since it was taking quite a bit of time to getting to have his neck treated. Once again I reminded him that because of our disagreement with regards to the "Drug Holidays", I was not about to take over any of his medications. He indicated to me that he would be okay with that but that  he still wanted to see how son I could bring him back to treat his lower back. I informed him that I had some opening cyst next Thursday and depending on whether or not his insurance company would approve it, we could end up bringing him on Thursday for a diagnostic bilateral lumbar facet block and right-sided SI joint injection.      Encounter for therapeutic drug level monitoring   High opioid Drug tolerance    The patient has been taking opioids since before 2011. Back then he was using fentanyl patches 12 mcg/h every 72 hours. This is the equivalent of 30 MME/Day. Now the patient is using fentanyl patches 100 mcg/h + oxycodone/APAP 10/325 one every 6 hours (40 mg/day of oxycodone). This is the equivalent of 300 MME/Day. Over the past 6 years his opioid requirements have increased tenfold. Over the years they have used the concept of "Opioid Rotation" in attempt to control his tolerance. Clearly this is an ineffective way of managing opioid tolerance. I subscribe to the concept of " Drug Holidays" to manage tolerance. Unfortunately, even though I have explained to the patient the mechanism, he disagrees with the concept and therefore has indicated that he is not about to proceed with any type of drug holidays. I have explained to the patient that this is not any negotiation and therefore I'm simply not willing to take over his medication management if we disagree with this basic concept.      Long term current use of opiate  analgesic (Chronic)   Relevant Orders   ToxASSURE Select 13 (MW), Urine   Long term prescription opiate use (Chronic)   Opiate use (300 MME/Day) (Chronic)     Low   Essential hypertension (Chronic)   Hyperlipidemia      Pharmacotherapy (Medications Ordered): No orders of the defined types were placed in this encounter.    Lab-work & Procedure Ordered: Orders Placed This Encounter  Procedures  . LUMBAR FACET(MEDIAL BRANCH NERVE BLOCK) MBNB  . SACROILIAC JOINT INJECTINS  . ToxASSURE Select 13 (MW), Urine  . Comprehensive metabolic panel  . C-reactive protein  . Magnesium  . Sedimentation rate  . Vitamin B12  . Vitamin D 1,25 dihydroxy    Imaging Ordered: None  Interventional Therapies: Scheduled: Diagnostic bilateral lumbar facet block + diagnostic right sacroiliac joint block under fluoroscopic guidance and IV sedation. Considering: Diagnostic L4-5 interlaminar lumbar epidural steroid injections versus transforaminal epidural steroid injections at the L4-5 level, bilaterally. PRN Procedures: None at this time.   Referral(s) or Consult(s): No medical psychology evaluation ordered since we will not be taking over this patient's medication management.  Medications administered during this visit: Mr. Requena does not currently have medications on file.  Prescriptions ordered during this visit: New Prescriptions   No medications on file    Requested PM Follow-up: Return for Procedure (Scheduled).  Future Appointments Date Time Provider [place]  04/13/2016 2:00 PM Roselee Nova, MD [place] None  04/14/2016 7:45 AM Milinda Pointer, MD [facility] None     Primary Care Physician: Ashok Norris, MD Location: [facility] Note by: Kathlen Brunswick. Dossie Arbour, M.D, DABA, DABAPM, DABPM, DABIPP, FIPP  Pain Score Disclaimer: We use the NRS-11 scale. This is a self-reported, subjective measurement of pain severity with only modest  accuracy. It is used primarily to identify changes within a particular patient. It must be understood that outpatient pain scales are significantly less accurate that those used for research, where they can  be applied under ideal controlled circumstances with minimal exposure to variables. In reality, the score is likely to be a combination of pain intensity and pain affect, where pain affect describes the degree of emotional arousal or changes in action readiness caused by the sensory experience of pain. Factors such as social and work situation, setting, emotional state, anxiety levels, expectation, and prior pain experience may influence pain perception and show large inter-individual differences that may also be affected by time variables.

## 2016-04-12 NOTE — Patient Instructions (Signed)
Facet Blocks Patient Information  Description: The facets are joints in the spine between the vertebrae.  Like any joints in the body, facets can become irritated and painful.  Arthritis can also effect the facets.  By injecting steroids and local anesthetic in and around these joints, we can temporarily block the nerve supply to them.  Steroids act directly on irritated nerves and tissues to reduce selling and inflammation which often leads to decreased pain.  Facet blocks may be done anywhere along the spine from the neck to the low back depending upon the location of your pain.   After numbing the skin with local anesthetic (like Novocaine), a small needle is passed onto the facet joints under x-ray guidance.  You may experience a sensation of pressure while this is being done.  The entire block usually lasts about 15-25 minutes.   Conditions which may be treated by facet blocks:   Low back/buttock pain  Neck/shoulder pain  Certain types of headaches  Preparation for the injection:  1. Do not eat any solid food or dairy products within 8 hours of your appointment. 2. You may drink clear liquid up to 3 hours before appointment.  Clear liquids include water, black coffee, juice or soda.  No milk or cream please. 3. You may take your regular medication, including pain medications, with a sip of water before your appointment.  Diabetics should hold regular insulin (if taken separately) and take 1/2 normal NPH dose the morning of the procedure.  Carry some sugar containing items with you to your appointment. 4. A driver must accompany you and be prepared to drive you home after your procedure. 5. Bring all your current medications with you. 6. An IV may be inserted and sedation may be given at the discretion of the physician. 7. A blood pressure cuff, EKG and other monitors will often be applied during the procedure.  Some patients may need to have extra oxygen administered for a short  period. 8. You will be asked to provide medical information, including your allergies and medications, prior to the procedure.  We must know immediately if you are taking blood thinners (like Coumadin/Warfarin) or if you are allergic to IV iodine contrast (dye).  We must know if you could possible be pregnant.  Possible side-effects:   Bleeding from needle site  Infection (rare, may require surgery)  Nerve injury (rare)  Numbness & tingling (temporary)  Difficulty urinating (rare, temporary)  Spinal headache (a headache worse with upright posture)  Light-headedness (temporary)  Pain at injection site (serveral days)  Decreased blood pressure (rare, temporary)  Weakness in arm/leg (temporary)  Pressure sensation in back/neck (temporary)   Call if you experience:   Fever/chills associated with headache or increased back/neck pain  Headache worsened by an upright position  New onset, weakness or numbness of an extremity below the injection site  Hives or difficulty breathing (go to the emergency room)  Inflammation or drainage at the injection site(s)  Severe back/neck pain greater than usual  New symptoms which are concerning to you  Please note:  Although the local anesthetic injected can often make your back or neck feel good for several hours after the injection, the pain will likely return. It takes 3-7 days for steroids to work.  You may not notice any pain relief for at least one week.  If effective, we will often do a series of 2-3 injections spaced 3-6 weeks apart to maximally decrease your pain.  After the initial   series, you may be a candidate for a more permanent nerve block of the facets.  If you have any questions, please [phone] [facility] 

## 2016-04-12 NOTE — Assessment & Plan Note (Addendum)
EXAM: MR-Lumbar Spine without contrast FINDINGS: Segmentation: Numbering in this report assumes five standard vertebral segments. Conus Medullaris: Grossly normal size and contour of the conus. Vertebral Column: No marrow abnormality of acute significance. Disc desiccation primarily at the L1-L2 and L5-S1 levels. Mild Modic I discogenic marrow changes subjacent to the L2 superior endplate anteriorly. Modic II discogenic marrow changes on either side of the L5-S1 disc space to the right of midline. Modic II discogenic marrow changes adjacent to the superior endplate of L3. Right-sided L2 vertebral body hemangioma is incidentally noted. There may be trace anterolisthesis at L4-5. L1-2: Mild circumferential disc bulge with negligible impression on the thecal sac. No central canal or foraminal stenosis. L2-3: No significant disc bulge or focal disc herniation. No central canal or foraminal stenosis. L3-4: No significant disc bulge or focal disc herniation. No central canal or foraminal stenosis. L4-5: Minimal disc bulge without significant mass effect on the thecal sac. Chronic facet arthropathy produces dorsal encroachment on the medial aspect of each L4 foramen, possibly with mild compression of the L4 nerve roots as they enter the foramina. No central canal stenosis. L5-S1: Chronic circumferential disc bulge and osteophyte without significant mass effect on the thecal sac. Chronic facet DJD is also present. However, there is no central canal or foraminal stenosis at this level. 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 There is narrowing of the medial aspect of each L4 foramen, primarily related to facet arthropathy.

## 2016-04-12 NOTE — Assessment & Plan Note (Addendum)
MRI cervical spine without contrast: FINDINGS: Vertebral bodies: Normal height and alignment.  Marrow signal: No significant abnormality. Cervical spinal cord: No mass or abnormal signal intensity.  No spinal cord compression. Craniovertebral junction: Normal.  C2-3: Mild facet joint arthritis on the left. C3-4: Moderate facet joint arthritis on the left. C4-5: Mild degenerative disc disease. Mild facet joint arthritis on the left. C5-6: Mild degenerative disc disease. C6-7: Moderate degenerative disc disease with posterior spurring. Moderate bilateral foraminal stenosis. C7-T1: Moderate facet joint arthritis on the right. 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

## 2016-04-13 ENCOUNTER — Encounter: Payer: Self-pay | Admitting: Family Medicine

## 2016-04-13 ENCOUNTER — Ambulatory Visit (INDEPENDENT_AMBULATORY_CARE_PROVIDER_SITE_OTHER): Payer: 59 | Admitting: Family Medicine

## 2016-04-13 VITALS — BP 140/80 | HR 75 | Temp 98.3°F | Resp 20 | Ht 71.0 in | Wt 242.1 lb

## 2016-04-13 DIAGNOSIS — Z0000 Encounter for general adult medical examination without abnormal findings: Secondary | ICD-10-CM | POA: Diagnosis not present

## 2016-04-13 MED ORDER — SIMVASTATIN 40 MG PO TABS: ORAL_TABLET | ORAL | Status: DC

## 2016-04-13 MED ORDER — ZOLPIDEM TARTRATE ER 6.25 MG PO TBCR: 6.2500 mg | EXTENDED_RELEASE_TABLET | Freq: Every evening | ORAL | Status: DC | PRN

## 2016-04-13 MED ORDER — FENOFIBRATE 145 MG PO TABS: ORAL_TABLET | ORAL | Status: DC

## 2016-04-13 MED ORDER — AMLODIPINE BESY-BENAZEPRIL HCL 5-20 MG PO CAPS: ORAL_CAPSULE | ORAL | Status: DC

## 2016-04-13 NOTE — Progress Notes (Signed)
Name: Carl Burke   MRN: [id number]    DOB: 1956-01-05   Date:04/13/2016       Progress Note  Subjective  Chief Complaint  Chief Complaint  Patient presents with  . Annual Exam    HPI  Pt. Is here for a Complete Physical Exam. He is doing well.    Past Medical History  Diagnosis Date  . Arthritis   . Hyperlipidemia   . Hypertension   . Opiate use 04/12/2016  . Lumbosacral radiculopathy at S1 (Left) 04/12/2016    Past Surgical History  Procedure Laterality Date  . Rotator cuff repair Left 2011  . Rotator cuff repair Right 2013    Family History  Problem Relation Age of Onset  . Heart disease Mother   . Cancer Sister   . Cancer Sister     Social History   Social History  . Marital Status: Married    Spouse Name: N/A  . Number of Children: N/A  . Years of Education: N/A   Occupational History  . Not on file.   Social History Main Topics  . Smoking status: Current Some Day Smoker -- 0.20 packs/day    Types: Cigarettes  . Smokeless tobacco: Not on file  . Alcohol Use: 0.0 oz/week    0 Standard drinks or equivalent per week  . Drug Use: No  . Sexual Activity: Not on file   Other Topics Concern  . Not on file   Social History Narrative     Current outpatient prescriptions:  .  amLODipine-benazepril (LOTREL) 5-20 MG capsule, Take 1 capsule by mouth  every day, Disp: 90 capsule, Rfl: 1 .  fenofibrate (TRICOR) 145 MG tablet, Take 1 tablet by mouth  daily, Disp: 90 tablet, Rfl: 3 .  fentaNYL (DURAGESIC - DOSED MCG/HR) 100 MCG/HR, Place 100 mcg onto the skin every 3 (three) days., Disp: , Rfl:  .  oxyCODONE-acetaminophen (PERCOCET) 10-325 MG tablet, Take 1 tablet by mouth every 6 (six) hours as needed for pain., Disp: , Rfl:  .  simvastatin (ZOCOR) 40 MG tablet, Take 1 tablet by mouth  every day at bedtime, Disp: 90 tablet, Rfl: 1 .  tiZANidine (ZANAFLEX) 4 MG tablet, Take 4 mg by mouth 3 (three) times daily., Disp: , Rfl:  .  zolpidem (AMBIEN CR) 6.25 MG CR  tablet, Take 6.25 mg by mouth at bedtime as needed for sleep., Disp: , Rfl:   Allergies  Allergen Reactions  . Naproxen Sodium Rash    800 mg tablets that the MD prescribed for pain.     Review of Systems  Constitutional: Negative for fever, chills, weight loss and malaise/fatigue.  HENT: Negative for congestion and sore throat.   Eyes: Negative for blurred vision. Double vision: occasional blurred/double vision, transient and accompanied by Headaches.  Respiratory: Negative for cough, shortness of breath and wheezing.   Cardiovascular: Negative for chest pain.  Gastrointestinal: Negative for abdominal pain and blood in stool.  Genitourinary: Negative for dysuria and frequency.  Musculoskeletal: Positive for back pain and neck pain.  Neurological: Positive for headaches. Negative for dizziness.  Psychiatric/Behavioral: Negative for depression. The patient is not nervous/anxious and does not have insomnia.     Objective  Filed Vitals:   04/13/16 1356  BP: 140/80  Pulse: 75  Temp: 98.3 F (36.8 C)  Resp: 20  Height: 5\' 11"  (1.803 m)  Weight: 242 lb 2 oz (109.827 kg)  SpO2: 97%    Physical Exam  Constitutional: He is oriented to  person, place, and time and well-developed, well-nourished, and in no distress.  HENT:  Head: Normocephalic and atraumatic.  Right Ear: Tympanic membrane and ear canal normal.  Left Ear: Tympanic membrane and ear canal normal.  Mouth/Throat: Posterior oropharyngeal erythema present.  Cardiovascular: Normal rate and regular rhythm.   Pulmonary/Chest: Effort normal and breath sounds normal. He has no wheezes.  Genitourinary:  Deferred  Neurological: He is alert and oriented to person, place, and time.  Skin: Skin is warm and dry.  Psychiatric: Memory, affect and judgment normal.  Nursing note and vitals reviewed.     Assessment & Plan  1. Annual physical exam Age appropriate laboratory and screening studies will be obtained. Patient  advised to contact gastroenterologist who performed colonoscopy to determine screening colonoscopy schedule. - Lipid Profile - CBC with Differential - Comprehensive Metabolic Panel (CMET) - TSH - Vitamin D (25 hydroxy) - PSA   Derran Sear [facility] 04/13/2016 2:12 PM

## 2016-04-14 ENCOUNTER — Encounter: Payer: Self-pay | Admitting: Pain Medicine

## 2016-04-14 ENCOUNTER — Ambulatory Visit: Payer: 59 | Attending: Pain Medicine | Admitting: Pain Medicine

## 2016-04-14 ENCOUNTER — Other Ambulatory Visit: Payer: Self-pay

## 2016-04-14 VITALS — BP 127/62 | HR 46 | Temp 98.0°F | Resp 16 | Ht 72.0 in | Wt 242.0 lb

## 2016-04-14 DIAGNOSIS — M533 Sacrococcygeal disorders, not elsewhere classified: Secondary | ICD-10-CM | POA: Insufficient documentation

## 2016-04-14 DIAGNOSIS — I10 Essential (primary) hypertension: Secondary | ICD-10-CM | POA: Insufficient documentation

## 2016-04-14 DIAGNOSIS — M791 Myalgia: Secondary | ICD-10-CM | POA: Diagnosis not present

## 2016-04-14 DIAGNOSIS — R51 Headache: Secondary | ICD-10-CM | POA: Diagnosis not present

## 2016-04-14 DIAGNOSIS — M545 Low back pain: Secondary | ICD-10-CM | POA: Diagnosis not present

## 2016-04-14 DIAGNOSIS — M1288 Other specific arthropathies, not elsewhere classified, other specified site: Secondary | ICD-10-CM | POA: Insufficient documentation

## 2016-04-14 DIAGNOSIS — R1030 Lower abdominal pain, unspecified: Secondary | ICD-10-CM | POA: Diagnosis not present

## 2016-04-14 DIAGNOSIS — M5417 Radiculopathy, lumbosacral region: Secondary | ICD-10-CM | POA: Insufficient documentation

## 2016-04-14 DIAGNOSIS — M4802 Spinal stenosis, cervical region: Secondary | ICD-10-CM | POA: Diagnosis not present

## 2016-04-14 DIAGNOSIS — Z79891 Long term (current) use of opiate analgesic: Secondary | ICD-10-CM | POA: Insufficient documentation

## 2016-04-14 DIAGNOSIS — M25552 Pain in left hip: Secondary | ICD-10-CM | POA: Diagnosis not present

## 2016-04-14 DIAGNOSIS — M160 Bilateral primary osteoarthritis of hip: Secondary | ICD-10-CM | POA: Insufficient documentation

## 2016-04-14 DIAGNOSIS — M546 Pain in thoracic spine: Secondary | ICD-10-CM | POA: Diagnosis not present

## 2016-04-14 DIAGNOSIS — M4726 Other spondylosis with radiculopathy, lumbar region: Secondary | ICD-10-CM | POA: Diagnosis not present

## 2016-04-14 DIAGNOSIS — M79606 Pain in leg, unspecified: Secondary | ICD-10-CM | POA: Diagnosis present

## 2016-04-14 DIAGNOSIS — M25511 Pain in right shoulder: Secondary | ICD-10-CM | POA: Insufficient documentation

## 2016-04-14 DIAGNOSIS — M79605 Pain in left leg: Secondary | ICD-10-CM | POA: Diagnosis not present

## 2016-04-14 DIAGNOSIS — M4806 Spinal stenosis, lumbar region: Secondary | ICD-10-CM | POA: Diagnosis not present

## 2016-04-14 DIAGNOSIS — M4316 Spondylolisthesis, lumbar region: Secondary | ICD-10-CM | POA: Diagnosis not present

## 2016-04-14 DIAGNOSIS — M25551 Pain in right hip: Secondary | ICD-10-CM | POA: Diagnosis not present

## 2016-04-14 DIAGNOSIS — M62830 Muscle spasm of back: Secondary | ICD-10-CM | POA: Diagnosis not present

## 2016-04-14 DIAGNOSIS — M79604 Pain in right leg: Secondary | ICD-10-CM | POA: Diagnosis not present

## 2016-04-14 DIAGNOSIS — R200 Anesthesia of skin: Secondary | ICD-10-CM | POA: Diagnosis not present

## 2016-04-14 DIAGNOSIS — M47812 Spondylosis without myelopathy or radiculopathy, cervical region: Secondary | ICD-10-CM | POA: Diagnosis not present

## 2016-04-14 DIAGNOSIS — E785 Hyperlipidemia, unspecified: Secondary | ICD-10-CM | POA: Insufficient documentation

## 2016-04-14 DIAGNOSIS — G8929 Other chronic pain: Secondary | ICD-10-CM | POA: Insufficient documentation

## 2016-04-14 DIAGNOSIS — M5481 Occipital neuralgia: Secondary | ICD-10-CM | POA: Diagnosis not present

## 2016-04-14 DIAGNOSIS — M549 Dorsalgia, unspecified: Secondary | ICD-10-CM | POA: Diagnosis present

## 2016-04-14 DIAGNOSIS — M47816 Spondylosis without myelopathy or radiculopathy, lumbar region: Secondary | ICD-10-CM

## 2016-04-14 DIAGNOSIS — M542 Cervicalgia: Secondary | ICD-10-CM | POA: Diagnosis not present

## 2016-04-14 LAB — CBC WITH DIFFERENTIAL/PLATELET
BASOS: 0 %
Basophils Absolute: 0 10*3/uL (ref 0.0–0.2)
EOS (ABSOLUTE): 0.2 10*3/uL (ref 0.0–0.4)
EOS: 1 %
HEMATOCRIT: 47.3 % (ref 37.5–51.0)
Hemoglobin: 15.7 g/dL (ref 12.6–17.7)
IMMATURE GRANS (ABS): 0 10*3/uL (ref 0.0–0.1)
IMMATURE GRANULOCYTES: 0 %
LYMPHS: 27 %
Lymphocytes Absolute: 3.7 10*3/uL — ABNORMAL HIGH (ref 0.7–3.1)
MCH: 29.8 pg (ref 26.6–33.0)
MCHC: 33.2 g/dL (ref 31.5–35.7)
MCV: 90 fL (ref 79–97)
MONOS ABS: 0.6 10*3/uL (ref 0.1–0.9)
Monocytes: 5 %
NEUTROS PCT: 67 %
Neutrophils Absolute: 8.8 10*3/uL — ABNORMAL HIGH (ref 1.4–7.0)
PLATELETS: 316 10*3/uL (ref 150–379)
RBC: 5.26 x10E6/uL (ref 4.14–5.80)
RDW: 15.3 % (ref 12.3–15.4)
WBC: 13.3 10*3/uL — ABNORMAL HIGH (ref 3.4–10.8)

## 2016-04-14 LAB — COMPREHENSIVE METABOLIC PANEL
ALT: 30 IU/L (ref 0–44)
AST: 22 IU/L (ref 0–40)
Albumin/Globulin Ratio: 2.1 (ref 1.2–2.2)
Albumin: 4.6 g/dL (ref 3.5–5.5)
Alkaline Phosphatase: 45 IU/L (ref 39–117)
BUN/Creatinine Ratio: 15 (ref 9–20)
BUN: 13 mg/dL (ref 6–24)
Bilirubin Total: 0.3 mg/dL (ref 0.0–1.2)
CALCIUM: 9.5 mg/dL (ref 8.7–10.2)
CHLORIDE: 101 mmol/L (ref 96–106)
CO2: 21 mmol/L (ref 18–29)
Creatinine, Ser: 0.86 mg/dL (ref 0.76–1.27)
GFR, EST AFRICAN AMERICAN: 111 mL/min/{1.73_m2} (ref >59)
GFR, EST NON AFRICAN AMERICAN: 96 mL/min/{1.73_m2} (ref >59)
GLUCOSE: 98 mg/dL (ref 65–99)
Globulin, Total: 2.2 g/dL (ref 1.5–4.5)
Potassium: 4.6 mmol/L (ref 3.5–5.2)
Sodium: 140 mmol/L (ref 134–144)
TOTAL PROTEIN: 6.8 g/dL (ref 6.0–8.5)

## 2016-04-14 LAB — LIPID PANEL
CHOLESTEROL TOTAL: 166 mg/dL (ref 100–199)
Chol/HDL Ratio: 4 ratio units (ref 0.0–5.0)
HDL: 42 mg/dL (ref >39)
LDL Calculated: 102 mg/dL — ABNORMAL HIGH (ref 0–99)
TRIGLYCERIDES: 110 mg/dL (ref 0–149)
VLDL CHOLESTEROL CAL: 22 mg/dL (ref 5–40)

## 2016-04-14 LAB — VITAMIN D 25 HYDROXY (VIT D DEFICIENCY, FRACTURES): Vit D, 25-Hydroxy: 23.8 ng/mL — ABNORMAL LOW (ref 30.0–100.0)

## 2016-04-14 LAB — TSH: TSH: 1.22 u[IU]/mL (ref 0.450–4.500)

## 2016-04-14 LAB — PSA: Prostate Specific Ag, Serum: 1 ng/mL (ref 0.0–4.0)

## 2016-04-14 MED ORDER — MIDAZOLAM HCL 5 MG/5ML IJ SOLN: 1.0000 mg | INTRAMUSCULAR | Status: DC | PRN

## 2016-04-14 MED ORDER — ROPIVACAINE HCL 2 MG/ML IJ SOLN
INTRAMUSCULAR | Status: AC
  Administered 2016-04-14: 08:00:00
  Filled 2016-04-14: qty 20

## 2016-04-14 MED ORDER — TRIAMCINOLONE ACETONIDE 40 MG/ML IJ SUSP: 40.0000 mg | Freq: Once | INTRAMUSCULAR | Status: DC

## 2016-04-14 MED ORDER — METHYLPREDNISOLONE ACETATE 80 MG/ML IJ SUSP: 80.0000 mg | Freq: Once | INTRAMUSCULAR | Status: DC

## 2016-04-14 MED ORDER — FENTANYL CITRATE (PF) 100 MCG/2ML IJ SOLN: 25.0000 ug | INTRAMUSCULAR | Status: DC | PRN

## 2016-04-14 MED ORDER — LIDOCAINE HCL (PF) 1 % IJ SOLN
INTRAMUSCULAR | Status: AC
Start: 2016-04-14 — End: 2016-04-14
  Administered 2016-04-14: 09:00:00
  Filled 2016-04-14: qty 5

## 2016-04-14 MED ORDER — METHYLPREDNISOLONE ACETATE 40 MG/ML IJ SUSP
INTRAMUSCULAR | Status: AC
  Filled 2016-04-14: qty 1

## 2016-04-14 MED ORDER — ROPIVACAINE HCL 2 MG/ML IJ SOLN
INTRAMUSCULAR | Status: AC
  Filled 2016-04-14: qty 10

## 2016-04-14 MED ORDER — MIDAZOLAM HCL 5 MG/5ML IJ SOLN
INTRAMUSCULAR | Status: AC
  Administered 2016-04-14: 3 mg via INTRAVENOUS
  Filled 2016-04-14: qty 5

## 2016-04-14 MED ORDER — FENTANYL CITRATE (PF) 100 MCG/2ML IJ SOLN
INTRAMUSCULAR | Status: AC
  Administered 2016-04-14: 50 ug via INTRAVENOUS
  Filled 2016-04-14: qty 2

## 2016-04-14 MED ORDER — METHYLPREDNISOLONE ACETATE 80 MG/ML IJ SUSP
INTRAMUSCULAR | Status: AC
  Filled 2016-04-14: qty 1

## 2016-04-14 MED ORDER — ROPIVACAINE HCL 2 MG/ML IJ SOLN: 9.0000 mL | Freq: Once | INTRAMUSCULAR | Status: DC

## 2016-04-14 MED ORDER — LACTATED RINGERS IV SOLN: 1000.0000 mL | Freq: Once | INTRAVENOUS | Status: DC

## 2016-04-14 MED ORDER — TRIAMCINOLONE ACETONIDE 40 MG/ML IJ SUSP
INTRAMUSCULAR | Status: AC
  Administered 2016-04-14: 08:00:00
  Filled 2016-04-14: qty 2

## 2016-04-14 MED ORDER — LIDOCAINE HCL (PF) 1 % IJ SOLN: 10.0000 mL | Freq: Once | INTRAMUSCULAR | Status: DC

## 2016-04-14 MED ORDER — ROPIVACAINE HCL 2 MG/ML IJ SOLN: 4.0000 mL | Freq: Once | INTRAMUSCULAR | Status: DC

## 2016-04-14 NOTE — Patient Instructions (Addendum)
Smoking Cessation, Tips for Success If you are ready to quit smoking, congratulations! You have chosen to help yourself be healthier. Cigarettes bring nicotine, tar, carbon monoxide, and other irritants into your body. Your lungs, heart, and blood vessels will be able to work better without these poisons. There are many different ways to quit smoking. Nicotine gum, nicotine patches, a nicotine inhaler, or nicotine nasal spray can help with physical craving. Hypnosis, support groups, and medicines help break the habit of smoking. WHAT THINGS CAN I DO TO MAKE QUITTING EASIER?  Here are some tips to help you quit for good: 1. Pick a date when you will quit smoking completely. Tell all of your friends and family about your plan to quit on that date. 2. Do not try to slowly cut down on the number of cigarettes you are smoking. Pick a quit date and quit smoking completely starting on that day. 3. Throw away all cigarettes.  4. Clean and remove all ashtrays from your home, work, and car. 5. On a card, write down your reasons for quitting. Carry the card with you and read it when you get the urge to smoke. 6. Cleanse your body of nicotine. Drink enough water and fluids to keep your urine clear or pale yellow. Do this after quitting to flush the nicotine from your body. 7. Learn to predict your moods. Do not let a bad situation be your excuse to have a cigarette. Some situations in your life might tempt you into wanting a cigarette. 8. Never have "just one" cigarette. It leads to wanting another and another. Remind yourself of your decision to quit. 9. Change habits associated with smoking. If you smoked while driving or when feeling stressed, try other activities to replace smoking. Stand up when drinking your coffee. Brush your teeth after eating. Sit in a different chair when you read the paper. Avoid alcohol while trying to quit, and try to drink fewer caffeinated beverages. Alcohol and caffeine may urge you  to smoke. 10. Avoid foods and drinks that can trigger a desire to smoke, such as sugary or spicy foods and alcohol. 11. Ask people who smoke not to smoke around you. 12. Have something planned to do right after eating or having a cup of coffee. For example, plan to take a walk or exercise. 13. Try a relaxation exercise to calm you down and decrease your stress. Remember, you may be tense and nervous for the first 2 weeks after you quit, but this will pass. 14. Find new activities to keep your hands busy. Play with a pen, coin, or rubber band. Doodle or draw things on paper. 15. Brush your teeth right after eating. This will help cut down on the craving for the taste of tobacco after meals. You can also try mouthwash.  16. Use oral substitutes in place of cigarettes. Try using lemon drops, carrots, cinnamon sticks, or chewing gum. Keep them handy so they are available when you have the urge to smoke. 17. When you have the urge to smoke, try deep breathing. 18. Designate your home as a nonsmoking area. 19. If you are a heavy smoker, ask your health care provider about a prescription for nicotine chewing gum. It can ease your withdrawal from nicotine. 20. Reward yourself. Set aside the cigarette money you save and buy yourself something nice. 21. Look for support from others. Join a support group or smoking cessation program. Ask someone at home or at work to help you with your plan   to quit smoking. 22. Always ask yourself, "Do I need this cigarette or is this just a reflex?" Tell yourself, "Today, I choose not to smoke," or "I do not want to smoke." You are reminding yourself of your decision to quit. 23. Do not replace cigarette smoking with electronic cigarettes (commonly called e-cigarettes). The safety of e-cigarettes is unknown, and some may contain harmful chemicals. 24. If you relapse, do not give up! Plan ahead and think about what you will do the next time you get the urge to smoke. HOW WILL  I FEEL WHEN I QUIT SMOKING? You may have symptoms of withdrawal because your body is used to nicotine (the addictive substance in cigarettes). You may crave cigarettes, be irritable, feel very hungry, cough often, get headaches, or have difficulty concentrating. The withdrawal symptoms are only temporary. They are strongest when you first quit but will go away within 10-14 days. When withdrawal symptoms occur, stay in control. Think about your reasons for quitting. Remind yourself that these are signs that your body is healing and getting used to being without cigarettes. Remember that withdrawal symptoms are easier to treat than the major diseases that smoking can cause.  Even after the withdrawal is over, expect periodic urges to smoke. However, these cravings are generally short lived and will go away whether you smoke or not. Do not smoke! WHAT RESOURCES ARE AVAILABLE TO HELP ME QUIT SMOKING? Your health care provider can direct you to community resources or hospitals for support, which may include: 1. Group support. 2. Education. 3. Hypnosis. 4. Therapy.   This information is not intended to replace advice given to you by your health care provider. Make sure you discuss any questions you have with your health care provider.   Document Released: 08/26/2004 Document Revised: 12/19/2014 Document Reviewed: 05/16/2013 Elsevier Interactive Patient Education 2016 [employer] Sacroiliac (SI) Joint Injection Patient Information  Description: The sacroiliac joint connects the scrum (very low back and tailbone) to the ilium (a pelvic bone which also forms half of the hip joint).  Normally this joint experiences very little motion.  When this joint becomes inflamed or unstable low back and or hip and pelvis pain may result.  Injection of this joint with local anesthetics (numbing medicines) and steroids can provide diagnostic information and reduce pain.  This injection is performed with the aid of  x-ray guidance into the tailbone area while you are lying on your stomach.   You may experience an electrical sensation down the leg while this is being done.  You may also experience numbness.  We also may ask if we are reproducing your normal pain during the injection.  Conditions which may be treated SI injection:   Low back, buttock, hip or leg pain  Preparation for the Injection:  5. Do not eat any solid food or dairy products within 8 hours of your appointment.  6. You may drink clear liquids up to 3 hours before appointment.  Clear liquids include water, black coffee, juice or soda.  No milk or cream please. 7. You may take your regular medications, including pain medications with a sip of water before your appointment.  Diabetics should hold regular insulin (if take separately) and take 1/2 normal NPH dose the morning of the procedure.  Carry some sugar containing items with you to your appointment. 8. A driver must accompany you and be prepared to drive you home after your procedure. 9. Bring all of your current medications with you. 10. An  IV may be inserted and sedation may be given at the discretion of the physician. 11. A blood pressure cuff, EKG and other monitors will often be applied during the procedure.  Some patients may need to have extra oxygen administered for a short period.  12. You will be asked to provide medical information, including your allergies, prior to the procedure.  We must know immediately if you are taking blood thinners (like Coumadin/Warfarin) or if you are allergic to IV iodine contrast (dye).  We must know if you could possible be pregnant.  Possible side effects:   Bleeding from needle site  Infection (rare, may require surgery)  Nerve injury (rare)  Numbness & tingling (temporary)  A brief convulsion or seizure  Light-headedness (temporary)  Pain at injection site (several days)  Decreased blood pressure (temporary)  Weakness in the  leg (temporary)   Call if you experience:   New onset weakness or numbness of an extremity below the injection site that last more than 8 hours.  Hives or difficulty breathing ( go to the emergency room)  Inflammation or drainage at the injection site  Any new symptoms which are concerning to you  Please note:  Although the local anesthetic injected can often make your back/ hip/ buttock/ leg feel good for several hours after the injections, the pain will likely return.  It takes 3-7 days for steroids to work in the sacroiliac area.  You may not notice any pain relief for at least that one week.  If effective, we will often do a series of three injections spaced 3-6 weeks apart to maximally decrease your pain.  After the initial series, we generally will wait some months before a repeat injection of the same type.  If you have any questions, please call [phone] [facility]  Facet Joint Block The facet joints connect the bones of the spine (vertebrae). They make it possible for you to bend, twist, and make other movements with your spine. They also prevent you from overbending, overtwisting, and making other excessive movements.  A facet joint block is a procedure where a numbing medicine (anesthetic) is injected into a facet joint. Often, a type of anti-inflammatory medicine called a steroid is also injected. A facet joint block may be done for two reasons:  2. Diagnosis. A facet joint block may be done as a test to see whether neck or back pain is caused by a worn-down or infected facet joint. If the pain gets better after a facet joint block, it means the pain is probably coming from the facet joint. If the pain does not get better, it means the pain is probably not coming from the facet joint.  3. Therapy. A facet joint block may be done to relieve neck or back pain caused by a facet joint. A facet joint block is only done as a therapy if the  pain does not improve with medicine, exercise programs, physical therapy, and other forms of pain management. LET [facility] CARE PROVIDER KNOW ABOUT:  13. Any allergies you have.  14. All medicines you are taking, including vitamins, herbs, eyedrops, and over-the-counter medicines and creams.  15. Previous problems you or members of your family have had with the use of anesthetics.  16. Any blood disorders you have had.  17. Other health problems you have. RISKS AND COMPLICATIONS Generally, having a facet joint block is safe. However, as with any procedure, complications can occur. Possible complications associated  with having a facet joint block include:  10. Bleeding.  11. Injury to a nerve near the injection site.  12. Pain at the injection site.  13. Weakness or numbness in areas controlled by nerves near the injection site.  14. Infection.  15. Temporary fluid retention.  16. Allergic reaction to anesthetics or medicines used during the procedure. BEFORE THE PROCEDURE  5. Follow your health care provider's instructions if you are taking dietary supplements or medicines. You may need to stop taking them or reduce your dosage.  6. Do not take any new dietary supplements or medicines without asking your health care provider first.  7. Follow your health care provider's instructions about eating and drinking before the procedure. You may need to stop eating and drinking several hours before the procedure.  8. Arrange to have an adult drive you home after the procedure. PROCEDURE  You may need to remove your clothing and dress in an open-back gown so that your health care provider can access your spine.   The procedure will be done while you are lying on an X-ray table. Most of the time you will be asked to lie on your stomach, but you may be asked to lie in a different position if an injection will be made in your neck.   Special machines will be used to monitor your  oxygen levels, heart rate, and blood pressure.   If an injection will be made in your neck, an intravenous (IV) tube will be inserted into one of your veins. Fluids and medicine will flow directly into your body through the IV tube.   The area over the facet joint where the injection will be made will be cleaned with an antiseptic soap. The surrounding skin will be covered with sterile drapes.   An anesthetic will be applied to your skin to make the injection area numb. You may feel a temporary stinging or burning sensation.   A video X-ray machine will be used to locate the joint. A contrast dye may be injected into the facet joint area to help with locating the joint.   When the joint is located, an anesthetic medicine will be injected into the joint through the needle.   Your health care provider will ask you whether you feel pain relief. If you do feel relief, a steroid may be injected to provide pain relief for a longer period of time. If you do not feel relief or feel only partial relief, additional injections of an anesthetic may be made in other facet joints.   The needle will be removed, the skin will be cleansed, and bandages will be applied.  AFTER THE PROCEDURE   You will be observed for 15-30 minutes before being allowed to go home. Do not drive. Have an adult drive you or take a taxi or public transportation instead.   If you feel pain relief, the pain will return in several hours or days when the anesthetic wears off.   You may feel pain relief 2-14 days after the procedure. The amount of time this relief lasts varies from person to person.   It is normal to feel some tenderness over the injected area(s) for 2 days following the procedure.   If you have diabetes, you may have a temporary increase in blood sugar.   This information is not intended to replace advice given to you by your health care provider. Make sure you discuss any questions you have with your  health care  provider.   Document Released: 04/19/2007 Document Revised: 12/19/2014 Document Reviewed: 09/17/2012 Elsevier Interactive Patient Education [employer].

## 2016-04-14 NOTE — Progress Notes (Signed)
Safety precautions to be maintained throughout the outpatient stay will include: orient to surroundings, keep bed in low position, maintain call bell within reach at all times, provide assistance with transfer out of bed and ambulation.  

## 2016-04-14 NOTE — Progress Notes (Signed)
Patient's Name: Carl Burke  Patient type: Established  MRN: [id number]  Service setting: Ambulatory outpatient  DOB: 1956-05-26  Location: [facility]  DOS: 04/14/2016  Primary Care Physician: Carl Norris, MD  Note by: Kathlen Brunswick. Dossie Arbour, M.D, DABA, DABAPM, DABPM, DABIPP, FIPP  Referring Physician: Ashok Norris, MD  Specialty: Board-Certified Interventional Pain Management  Last Visit to Pain Management: 04/12/2016   Primary Reason(s) for Visit: Interventional Pain Management Treatment. CC: Back Pain; Leg Pain; and Headache  Primary Diagnosis: Facet syndrome, lumbar [M54.5]   Procedure:  Anesthesia, Analgesia, Anxiolysis:  Procedure #1: Type: Diagnostic Medial Branch Facet Block Region: Lumbar Level: L2, L3, L4, L5, & S1 Medial Branch Level(s) Laterality: Bilateral  Procedure #2: Type: Diagnostic Sacroiliac Joint Block Region: Posterior Lumbosacral Level: PSIS (Posterior Superior Iliac Spine) Sacroiliac Joint Laterality: Right  Indications: 1. Lumbar facet syndrome (Bilateral) (L>R)   2. Chronic low back pain (Bilateral) (L>R)   3. Chronic sacroiliac joint pain (Right)   4. Lumbar spondylosis, unspecified spinal osteoarthritis     Pre-procedure Pain Score: 5/10 Reported level of pain is compatible with clinical observations Post-procedure Pain Score: 4   Type: Moderate (Conscious) Sedation & Local Anesthesia Local Anesthetic: Lidocaine 1% Route: Intravenous (IV) IV Access: Secured Sedation: Meaningful verbal contact was maintained at all times during the procedure  Indication(s): Analgesia & Anxiolysis   Pre-Procedure Assessment  Carl Burke is a 59 y.o. year old, male patient, seen today for interventional treatment. 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); Lumbar facet syndrome (Bilateral) (L>R); Lumbar spondylosis; Grade 1 Anterolisthesis of L4 over L5; Lumbar facet arthropathy; Chronic sacroiliac joint pain (Right); Chronic hip pain (Bilateral); Muscle spasm of back; Cervicogenic headache (Location of Primary Source of Pain) (Bilateral) (R>L); Chronic occipital neuralgia (Location of Primary Source of Pain) (Bilateral) (R>L); Chronic upper back pain (Location of Secondary source of pain) (Bilateral) (midline); Chronic neck pain (Location of Tertiary source of pain) (Bilateral) (R>L); Cervical facet syndrome (Location of Tertiary source of pain) (Bilateral) (R>L); Chronic low back pain (Bilateral) (L>R); Chronic groin pain (Bilateral) (L>R); 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 His primarily concern today is the Back Pain; Leg Pain; and Headache   Pain Type: Chronic pain Pain Location: Back Pain Orientation: Lower Pain Descriptors / Indicators: Constant, Burning, Numbness, Sharp Pain Frequency: Constant  Date of Last Visit: 04/12/16 Service Provided on Last Visit: Evaluation (new patient)  Verification of the correct person, correct site (including marking of site), and correct procedure were performed and confirmed by the patient.  Consent: Secured. Under the influence of no sedatives a written informed consent was obtained, after having  provided information on the risks and possible complications. To fulfill our ethical and legal obligations, as recommended by the American Medical Association's Code of Ethics, we have provided information to the patient about our clinical impression; the nature and purpose of the treatment or procedure; the risks, benefits, and possible complications of  the intervention; alternatives; the risk(s) and benefit(s) of the alternative treatment(s) or procedure(s); and the risk(s) and benefit(s) of doing nothing. The patient was provided information about the risks and possible complications associated with the procedure. These include, but are not limited to, failure to achieve desired goals, infection, bleeding, organ or nerve damage, allergic reactions, paralysis, and death. In the case of spinal procedures these may include, but are not limited to, failure to achieve desired goals, infection, bleeding, organ or nerve damage, allergic reactions, paralysis, and death. In the case of intra- or periarticular procedures these may include, but are not limited to, failure to achieve desired goals, infection, bleeding (hemarthrosis), organ or nerve damage, allergic reactions, and death. In addition, the patient was informed that Medicine is not an exact science; therefore, there is also the possibility of unforeseen risks and possible complications that may result in a catastrophic outcome. The patient indicated having understood very clearly. We have given the patient no guarantees and we have made no promises. Enough time was given to the patient to ask questions, all of which were answered to the patient's satisfaction.  Consent Attestation: I, the ordering provider, attest that I have discussed with the patient the benefits, risks, side-effects, alternatives, likelihood of achieving goals, and potential problems during recovery for the procedure that I have provided informed consent.  Pre-Procedure  Preparation: Safety Precautions: Allergies reviewed. Appropriate site, procedure, and patient were confirmed by following the Joint Commission's Universal Protocol (UP.01.01.01), in the form of a "Time Out". The patient was asked to confirm marked site and procedure, before commencing. The patient was asked about blood thinners, or active infections, both of which were denied. Patient was assessed for positional comfort and all pressure points were checked before starting procedure. Allergies: He is allergic to naproxen sodium.. Infection Control Precautions: Sterile technique used. Standard Universal Precautions were taken as recommended by the Department of [facility] for Disease Control and Prevention (CDC). Standard pre-surgical skin prep was conducted. Respiratory hygiene and cough etiquette was practiced. Hand hygiene observed. Safe injection practices and needle disposal techniques followed. SDV (single dose vial) medications used. Medications properly checked for expiration dates and contaminants. Personal protective equipment (PPE) used: Sterile Radiation-resistant gloves. Monitoring:  As per clinic protocol. Filed Vitals:   04/14/16 0854 04/14/16 0900 04/14/16 0910 04/14/16 0920  BP: 129/78 154/71 153/68 127/62  Pulse:      Temp:      TempSrc:    Temporal  Resp: '18 16 16 16  ' Height:      Weight:      SpO2: 95% 99% 98% 99%  Calculated BMI: Body mass index is 32.81 kg/(m^2).  Description of Procedure #1 Process:   Time-out: "Time-out" completed before starting procedure, as per protocol. Position: Prone Target Area: For Lumbar Facet blocks, the target is the groove formed by the junction of the transverse process and superior articular process. For the L5 dorsal ramus, the target is the notch between superior articular process and sacral ala. For the S1 dorsal ramus, the target is the superior and lateral edge of the posterior S1 Sacral foramen. Approach: Paramedial  approach. Area Prepped: Entire Posterior Lumbosacral Region Prepping solution: ChloraPrep (2% chlorhexidine gluconate and 70% isopropyl alcohol) Safety Precautions: Aspiration looking for blood return was conducted prior to all injections. At no point did we inject any substances, as a needle was being advanced. No attempts were made at seeking any paresthesias. Safe injection practices and needle disposal techniques used. Medications properly checked  for expiration dates. SDV (single dose vial) medications used.     Description of the Procedure: Protocol guidelines were followed. The patient was placed in position over the fluoroscopy table. The target area was identified and the area prepped in the usual manner. Skin desensitized using vapocoolant spray. Skin & deeper tissues infiltrated with local anesthetic. Appropriate amount of time allowed to pass for local anesthetics to take effect. The procedure needle was introduced through the skin, ipsilateral to the reported pain, and advanced to the target area. Employing the "Medial Branch Technique", the needles were advanced to the angle made by the superior and medial portion of the transverse process, and the lateral and inferior portion of the superior articulating process of the targeted vertebral bodies. This area is known as "Bur[place]'s Eye" or the "Eye of the [place] Dog". A procedure needle was introduced through the skin, and this time advanced to the angle made by the superior and medial border of the sacral ala, and the lateral border of the S1 vertebral body. This last needle was later repositioned at the superior and lateral border of the posterior S1 foramen. Negative aspiration confirmed. Solution injected in intermittent fashion, asking for systemic symptoms every 0.5cc of injectate. The needles were then removed and the area cleansed, making sure to leave some of the prepping solution back to take advantage of its long term bactericidal  properties. Materials & Medications Used:  Needle(s) Used: 22g - 3.5" Spinal Needle(s)  Description of Procedure # 2 Process:   Time-out: "Time-out" completed before starting procedure, as per protocol. Position: Prone Target Area: For upper sacroiliac joint block(s), the target is the superior and posterior margin of the sacroiliac joint. Approach: Ipsilateral approach. Area Prepped: Entire Posterior Lumbosacral Region Prepping solution: Duraprep (Iodine Povacrylex [0.7% available Iodine] and Isopropyl Alcohol, 74% w/w) Safety Precautions: Aspiration looking for blood return was conducted prior to all injections. At no point did we inject any substances, as a needle was being advanced. No attempts were made at seeking any paresthesias. Safe injection practices and needle disposal techniques used. Medications properly checked for expiration dates. SDV (single dose vial) medications used. Description of the Procedure: Protocol guidelines were followed. The patient was placed in position over the fluoroscopy table. The target area was identified and the area prepped in the usual manner. Skin desensitized using vapocoolant spray. Skin & deeper tissues infiltrated with local anesthetic. Appropriate amount of time allowed to pass for local anesthetics to take effect. The procedure needle was advanced under fluoroscopic guidance into the sacroiliac joint until a firm endpoint was obtained. Proper needle placement secured. Negative aspiration confirmed. Solution injected in intermittent fashion, asking for systemic symptoms every 0.5cc of injectate. The needles were then removed and the area cleansed, making sure to leave some of the prepping solution back to take advantage of its long term bactericidal properties. Materials & Medications Used:  Needle(s) Used: 22g - 3.5" Spinal Needle(s)  EBL: None Medications Administered today: We administered ropivacaine (PF) 2 mg/ml (0.2%), triamcinolone acetonide,  fentaNYL, midazolam, and lidocaine (PF).Please see chart orders for dosing details.  Imaging Guidance:   Type of Imaging Technique: Fluoroscopy Guidance (Spinal) Indication(s): Assistance in needle guidance and placement for procedures requiring needle placement in or near specific anatomical locations not easily accessible without such assistance. Exposure Time: Please see nurses notes. Contrast: None required. Fluoroscopic Guidance: I was personally present in the fluoroscopy suite, where the patient was placed in position for the procedure, over the fluoroscopy-compatible table. Fluoroscopy was  manipulated, using "Tunnel Vision Technique", to obtain the best possible view of the target area, on the affected side. Parallax error was corrected before commencing the procedure. A "direction-depth-direction" technique was used to introduce the needle under continuous pulsed fluoroscopic guidance. Once the target was reached, antero-posterior, oblique, and lateral fluoroscopic projection views were taken to confirm needle placement in all planes. Permanently recorded images stored by scanning into EMR. Interpretation: Intraoperative imaging interpretation by performing Physician. Adequate needle placement confirmed. Adequate needle placement confirmed in AP, lateral, & Oblique Views. No contrast injected.  Antibiotic Prophylaxis:  Indication(s): No indications identified. Type:  Antibiotics Given (last 72 hours)    None       Post-operative Assessment:   Complications: No immediate post-treatment complications were observed. Disposition: Return to clinic for follow-up evaluation. The patient tolerated the entire procedure well. A repeat set of vitals were taken after the procedure and the patient was kept under observation following institutional policy, for this procedure. Post-procedural neurological assessment was performed, showing return to baseline, prior to discharge. The patient was  discharged home, once institutional criteria were met. The patient was provided with post-procedure discharge instructions, including a section on how to identify potential problems. Should any problems arise concerning this procedure, the patient was given instructions to immediately contact us, at any time, without hesitation. In any case, we plan to contact the patient by telephone for a follow-up status report regarding this interventional procedure. Comments:  No additional relevant information.  Medications administered during this visit: We administered ropivacaine (PF) 2 mg/ml (0.2%), triamcinolone acetonide, fentaNYL, midazolam, and lidocaine (PF).  Prescriptions ordered during this visit: New Prescriptions   No medications on file    Future Appointments Date Time Provider Petronila  05/02/2016 8:40 AM Milinda Pointer, MD [facility] None    Primary Care Physician: Carl Norris, MD Location: [facility] Note by: Kathlen Brunswick. Dossie Arbour, M.D, DABA, DABAPM, DABPM, DABIPP, FIPP  Disclaimer:  Medicine is not an exact science. The only guarantee in medicine is that nothing is guaranteed. It is important to note that the decision to proceed with this intervention was based on the information collected from the patient. The Data and conclusions were drawn from the patient's questionnaire, the interview, and the physical examination. Because the information was provided in large part by the patient, it cannot be guaranteed that it has not been purposely or unconsciously manipulated. Every effort has been made to obtain as much relevant data as possible for this evaluation. It is important to note that the conclusions that lead to this procedure are derived in large part from the available data. Always take into account that the treatment will also be dependent on availability of resources and existing treatment guidelines, considered by other Pain Management  Practitioners as being common knowledge and practice, at the time of the intervention. For Medico-Legal purposes, it is also important to point out that variation in procedural techniques and pharmacological choices are the acceptable norm. The indications, contraindications, technique, and results of the above procedure should only be interpreted and judged by a Board-Certified Interventional Pain Specialist with extensive familiarity and expertise in the same exact procedure and technique. Attempts at providing opinions without similar or greater experience and expertise than that of the treating physician will be considered as inappropriate and unethical, and shall result in a formal complaint to the state medical board and applicable specialty societies.

## 2016-04-15 ENCOUNTER — Telehealth: Payer: Self-pay

## 2016-04-15 ENCOUNTER — Encounter: Payer: Self-pay | Admitting: Family Medicine

## 2016-04-15 NOTE — Telephone Encounter (Signed)
Post procedure phone call.  Patient states he is doing fine.  

## 2016-04-20 LAB — TOXASSURE SELECT 13 (MW), URINE

## 2016-04-27 ENCOUNTER — Telehealth: Payer: Self-pay | Admitting: *Deleted

## 2016-04-27 NOTE — Telephone Encounter (Signed)
Returned patient call.  States he did not get any relief from injection.  Patient became irate on the phone and states that he would not be our [place] pig any more and we had taken him off all of his medicine.  Informed patient that he had an appointment on Monday and if he felt like he needed to, he could cancel.  Dr Dossie Arbour notified.

## 2016-04-27 NOTE — Telephone Encounter (Signed)
Pt called requesting to speak with Dr. Dossie Arbour or his assistant due to the shot that was given to him did not work. He stated he has no more pain meds and he's in pain. Pt is requesting a injection and knows that he has an appt next week but he's in too much pain to wait for that office visit. Pt would love for someone to return his call...thanks

## 2016-05-02 ENCOUNTER — Telehealth: Payer: Self-pay | Admitting: Pain Medicine

## 2016-05-02 ENCOUNTER — Ambulatory Visit: Payer: 59 | Attending: Pain Medicine | Admitting: Pain Medicine

## 2016-05-02 ENCOUNTER — Encounter: Payer: Self-pay | Admitting: Pain Medicine

## 2016-05-02 VITALS — BP 156/78 | HR 96 | Temp 97.9°F | Resp 16 | Ht 72.0 in | Wt 242.0 lb

## 2016-05-02 DIAGNOSIS — M48061 Spinal stenosis, lumbar region without neurogenic claudication: Secondary | ICD-10-CM

## 2016-05-02 DIAGNOSIS — M533 Sacrococcygeal disorders, not elsewhere classified: Secondary | ICD-10-CM

## 2016-05-02 DIAGNOSIS — G8929 Other chronic pain: Secondary | ICD-10-CM | POA: Insufficient documentation

## 2016-05-02 DIAGNOSIS — M4316 Spondylolisthesis, lumbar region: Secondary | ICD-10-CM | POA: Diagnosis not present

## 2016-05-02 DIAGNOSIS — M549 Dorsalgia, unspecified: Secondary | ICD-10-CM | POA: Diagnosis present

## 2016-05-02 DIAGNOSIS — M545 Low back pain, unspecified: Secondary | ICD-10-CM

## 2016-05-02 DIAGNOSIS — M4806 Spinal stenosis, lumbar region: Secondary | ICD-10-CM | POA: Diagnosis not present

## 2016-05-02 DIAGNOSIS — I10 Essential (primary) hypertension: Secondary | ICD-10-CM | POA: Diagnosis not present

## 2016-05-02 DIAGNOSIS — F17210 Nicotine dependence, cigarettes, uncomplicated: Secondary | ICD-10-CM | POA: Insufficient documentation

## 2016-05-02 DIAGNOSIS — M47816 Spondylosis without myelopathy or radiculopathy, lumbar region: Secondary | ICD-10-CM

## 2016-05-02 DIAGNOSIS — Z6830 Body mass index (BMI) 30.0-30.9, adult: Secondary | ICD-10-CM | POA: Insufficient documentation

## 2016-05-02 DIAGNOSIS — M5416 Radiculopathy, lumbar region: Secondary | ICD-10-CM | POA: Insufficient documentation

## 2016-05-02 DIAGNOSIS — M79606 Pain in leg, unspecified: Secondary | ICD-10-CM | POA: Diagnosis not present

## 2016-05-02 DIAGNOSIS — Q762 Congenital spondylolisthesis: Secondary | ICD-10-CM | POA: Diagnosis not present

## 2016-05-02 DIAGNOSIS — M4310 Spondylolisthesis, site unspecified: Secondary | ICD-10-CM

## 2016-05-02 DIAGNOSIS — E785 Hyperlipidemia, unspecified: Secondary | ICD-10-CM | POA: Insufficient documentation

## 2016-05-02 MED ORDER — OXYCODONE-ACETAMINOPHEN 10-325 MG PO TABS: 1.0000 | ORAL_TABLET | Freq: Four times a day (QID) | ORAL | Status: DC | PRN

## 2016-05-02 NOTE — Progress Notes (Signed)
Patient's Name: Carl Burke  Patient type: Established  MRN: [id number]  Service setting: Ambulatory outpatient  DOB: 1956-03-25  Location: [facility]  DOS: 05/02/2016  Primary Care Physician: Ashok Norris, MD  Note by: Kathlen Brunswick. Dossie Arbour, M.D, DABA, DABAPM, DABPM, Milagros Evener, FIPP  Referring Physician: Phylliss Bob, MD  Specialty: Board-Certified Interventional Pain Management  Last Visit to Pain Management: 05/02/2016   Primary Reason(s) for Visit: Encounter for post-procedure evaluation of chronic illness with mild to moderate exacerbation CC: Back Pain   HPI  Carl Burke is a 60 y.o. year old, male patient, who returns today as an established patient. 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); Lumbar facet syndrome (Bilateral) (L>R); Lumbar spondylosis; Grade 1 Anterolisthesis of L4 over L5; Lumbar facet arthropathy; Chronic sacroiliac joint pain (Right); Chronic hip pain (Bilateral); Muscle spasm of back; Cervicogenic headache (Location of Primary Source of Pain) (Bilateral) (R>L); Chronic occipital neuralgia (Location of Primary Source of Pain) (Bilateral) (R>L); Chronic upper back pain (Location of Secondary source of pain) (Bilateral) (midline); Chronic neck pain (Location of Tertiary source of pain) (Bilateral) (R>L); Cervical facet syndrome (Location of Tertiary source of pain) (Bilateral) (R>L); Chronic low back pain (Bilateral) (L>R); Chronic groin pain (Bilateral) (L>R); 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 His primarily concern today is the Back Pain   Pain Assessment: Self-Reported Pain Score: 4  Reported level is compatible with observation Pain Type: Chronic pain Pain Location: Back Pain Orientation: Lower Pain Descriptors / Indicators: Aching, Burning, Constant, Radiating, Shooting, Sharp Pain Frequency: Rarely  The patient comes into the clinics today for post-procedure evaluation on the interventional treatment done on 05/02/2016. The patient returns today after having had a diagnostic bilateral lumbar facet block #1, as well as a right sided intra-articular sacroiliac joint block #1 under fluoroscopic guidance and IV sedation.  The patient's main question as to whether or not further interventional therapies in the lumbar region would be effective in controlling his pain has been answered with the results of this diagnostic injection.  The patient is having quite a bit of difficulty managing his pain in the sense that it apparently triggers him to have anger issues. Our involvement with this case has comfortable and and therefore we will let him go. I have provided him with a single prescription for pain medication to assist until he can continue his care elsewhere.  Date of Last Visit: 04/14/16 Service Provided on Last Visit: Procedure (bilateral lumbar facet, right SI joint injection)  Post-Procedure Assessment  Procedure done on last visit: Diagnostic bilateral lumbar facet block #1 + right sided sacroiliac joint block #1, under fluoroscopic guidance and IV sedation. Side-effects or Adverse reactions: None reported Sedation: Sedation given   Results: Ultra-Short Term Relief (First 1 hour after procedure): 50 %  Analgesia during this period is likely to be Local Anesthetic and/or IV Sedative (Analgesic/Anxiolitic) related Short Term Relief (Initial 4-6 hrs after procedure): 60 %  Complete relief confirms area to be the source of pain Long Term Relief : 0 % (at 7 in the evening, the pain came back and pain score 1-2/10) No benefit could suggest etiology to be non-inflammatory, possibly compressive   Current Relief (Now): 0%  No persistent benefit would suggest no long-term anti-inflammatory benefit from intervention and/or persistent or recurrent aggravating factors Interpretation of Results: These results would suggest that up to a maximum of 50% of his pain may be coming from the lumbar facets and SI joint. This means that there is another 50% but it's unaccounted for. Most likely this is coming from the intra-spinal and/or foraminal area. In view of this and the patient's description of the lower extremity pain, it is very likely that he may need a series of lumbar epidural steroid injections/transforaminal epidural steroid injections under fluoroscopic guidance.   Laboratory Chemistry  Inflammation Markers No results found for: ESRSEDRATE, CRP  Renal Function Lab Results  Component Value Date   BUN 13 04/13/2016   CREATININE 0.86 04/13/2016   GFRAA 111 04/13/2016   GFRNONAA 96 04/13/2016    Hepatic Function Lab Results  Component Value Date   AST 22 04/13/2016   ALT 30 04/13/2016   ALBUMIN 4.6 04/13/2016    Electrolytes Lab Results  Component Value Date   NA 140 04/13/2016   K 4.6 04/13/2016   CL 101 04/13/2016   CALCIUM 9.5 04/13/2016    Pain Modulating Vitamins Lab Results  Component Value Date   VD25OH 23.8* 04/13/2016    Coagulation Parameters No results found for: INR, LABPROT   Recent Diagnostic Imaging  Mr Shoulder Right Wo Contrast  01/16/2014  * PRIOR REPORT IMPORTED FROM AN EXTERNAL SYSTEM  * CLINICAL DATA:  Right shoulder pain and decreased range of motion. EXAM: MRI OF THE RIGHT SHOULDER WITHOUT CONTRAST TECHNIQUE: Multiplanar, multisequence MR imaging of the shoulder was performed. No intravenous contrast was administered. COMPARISON:  MRI dated 07/19/2013 FINDINGS: Rotator cuff: Large full-thickness retracted rotator cuff tear demonstrated on the prior exam has been repaired. The rotator cuff is intact. The distal supraspinatus tendon is quite degenerated and thin with irregularity of the bursal surface but there is no discrete tear. Muscles: Edema in the infraspinatus muscle seen on the prior study has resolved. The supraspinatus muscle is larger than on the prior exam and appears normal. Biceps long head: The patient has undergone biceps tenodesis. The reattached tendon appears intact distally. Acromioclavicular Joint: There is prominent hypertrophy of the distal clavicle and acromion at the [facility] joint. Slight type 3 acromion could predispose to impingement. There is a small amount of fluid in the subacromial/subdeltoid bursa. Glenohumeral Joint: Normal. Labrum: Intact except for the resected origin of the long head of the biceps tendon. Bones: No acute abnormality. Multiple anchors in the humeral head from previous rotator cuff repair and biceps tenodesis. IMPRESSION: 1. Intact repaired rotator cuff. The distal supraspinatus tendon is quite degenerated and plan with the bursal surface irregularity. 2. Hypertrophied AC joint with a type 3 acromion could predispose to impingement. There is slight subacromial/subdeltoid bursitis. Electronically Signed   By: Rozetta Nunnery M.D.   On: 01/16/2014 09:42     Meds  The patient has a current medication list which includes the following prescription(s): amlodipine-benazepril, fenofibrate, simvastatin, tizanidine, zolpidem, fentanyl, and oxycodone-acetaminophen.  Current Outpatient Prescriptions on File Prior to Visit  Medication Sig  .  amLODipine-benazepril (LOTREL) 5-20 MG capsule Take 1 capsule by mouth  every day  . fenofibrate (TRICOR)  145 MG tablet Take 1 tablet by mouth  daily  . simvastatin (ZOCOR) 40 MG tablet Take 1 tablet by mouth  every day at bedtime  . tiZANidine (ZANAFLEX) 4 MG tablet Take 4 mg by mouth 3 (three) times daily.  Marland Kitchen zolpidem (AMBIEN CR) 6.25 MG CR tablet Take 1 tablet (6.25 mg total) by mouth at bedtime as needed for sleep.  . fentaNYL (DURAGESIC - DOSED MCG/HR) 100 MCG/HR Place 100 mcg onto the skin every 3 (three) days. Reported on 05/02/2016   No current facility-administered medications on file prior to visit.   Allergies  Mr. Alvira is allergic to naproxen sodium.  [facility]  Medical:  Mr. Rouch  has a past medical history of Arthritis; Hyperlipidemia; Hypertension; Opiate use (04/12/2016); and Lumbosacral radiculopathy at S1 (Left) (04/12/2016). Family: family history includes Cancer in his sister and sister; Heart disease in his mother. Surgical:  has past surgical history that includes Rotator cuff repair (Left, 2011) and Rotator cuff repair (Right, 2013). Tobacco:  reports that he has been smoking Cigarettes.  He has been smoking about 0.20 packs per day. He does not have any smokeless tobacco history on file. Alcohol:  reports that he drinks alcohol. Drug:  reports that he does not use illicit drugs.  Constitutional Exam  Vitals: Blood pressure 156/78, pulse 96, temperature 97.9 F (36.6 C), temperature source Oral, resp. rate 16, height 6' (1.829 m), weight 242 lb (109.77 kg), SpO2 97 %. General appearance: Well nourished, well developed, and well hydrated. In no acute distress Calculated BMI/Body habitus: Body mass index is 32.81 kg/(m^2). (30-34.9 kg/m2) Obese (Class I) - 68% higher incidence of chronic pain Psych/Mental status: Alert and oriented x 3 (person, place, & time) Eyes: PERLA Respiratory: No evidence of acute respiratory distress   Gait & Posture Assessment  Ambulation:  Unassisted Gait: Antalgic Posture: Antalgic  Lower Extremity Exam    Side: Right lower extremity  Side: Left lower extremity  Inspection: No masses, redness, swelling, or asymmetry ROM:  Inspection: No masses, redness, swelling, or asymmetry ROM:  Functional: ROM is within functional limits [facility])  Functional: ROM is within functional limits [facility])  Muscle strength & Tone: Functionally intact  Muscle strength & Tone: Functionally intact  Sensory: Unimpaired  Sensory: Unimpaired  Palpation: Non-contributory  Palpation: Non-contributory   Assessment & Plan  Primary Diagnosis & Pertinent Problem List: The primary encounter diagnosis was Chronic low back pain (Bilateral) (L>R). 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  Visit Diagnosis: 1. Chronic low back pain (Bilateral) (L>R)   2. Chronic sacroiliac joint pain (Right)   3. Grade 1 Anterolisthesis of L4 over L5   4. Lumbar facet syndrome (Bilateral) (L>R)   5. Lumbar facet arthropathy   6. 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 Chronic pain of lower extremity, unspecified laterality   8. Chronic lumbar radicular pain (L5/S1 dermatomal distribution) (Bilateral) (L>R)   9. Chronic pain     Problem-specific Plan(s): No problem-specific assessment & plan notes found for this encounter.   Plan of Care   Problem List Items Addressed This Visit      High   Chronic low back pain (Bilateral) (L>R) - Primary (Chronic)  Relevant Medications   oxyCODONE-acetaminophen (PERCOCET) 10-325 MG tablet   Chronic lower extremity pain (Bilateral) (L>R) (Chronic)    Chronic lumbar radicular pain (L5/S1 dermatomal distribution) (Bilateral) (L>R) (Chronic)   Chronic pain (Chronic)   Relevant Medications   oxyCODONE-acetaminophen (PERCOCET) 10-325 MG tablet   Chronic sacroiliac joint pain (Right) (Chronic)   Relevant Medications   oxyCODONE-acetaminophen (PERCOCET) 10-325 MG tablet   Grade 1 Anterolisthesis of L4 over L5 (Chronic)   Lumbar facet arthropathy (Chronic)   Lumbar facet syndrome (Bilateral) (L>R) (Chronic)   Relevant Medications   oxyCODONE-acetaminophen (PERCOCET) 10-325 MG tablet   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 oxyCODONE-acetaminophen (PERCOCET) 10-325 MG tablet    Sig: Take 1 tablet by mouth every 6 (six) hours as needed for pain.    Dispense:  120 tablet    Refill:  0    Do not add this medication to the electronic "Automatic Refill" notification system. Patient may have prescription filled one day early if pharmacy is closed on scheduled refill date. Do not fill until: 05/02/16. Not to be repeated. To last until: 06/01/16    [facility] & Procedure Ordered: No orders of the defined types were placed in this encounter.    Imaging Ordered: None  Interventional Therapies: Scheduled:  None.   Considering:  None.   PRN Procedures:  None.   Referral(s) or Consult(s): Back to referring physician, Dr. Phylliss Bob.  Medications administered during this visit: Mr. Eady had no medications administered during this visit.  Requested PM Follow-up: Return for No return appointment. D/C from service..  No future appointments.  Primary Care Physician: Ashok Norris, MD Location: [facility] Note by: Kathlen Brunswick. Dossie Arbour, M.D, DABA, DABAPM, DABPM, DABIPP, FIPP  Pain Score Disclaimer: We use the NRS-11 scale. This is a self-reported, subjective measurement of pain  severity with only modest accuracy. It is used primarily to identify changes within a particular patient. It must be understood that outpatient pain scales are significantly less accurate that those used for research, where they can be applied under ideal controlled circumstances with minimal exposure to variables. In reality, the score is likely to be a combination of pain intensity and pain affect, where pain affect describes the degree of emotional arousal or changes in action readiness caused by the sensory experience of pain. Factors such as social and work situation, setting, emotional state, anxiety levels, expectation, and prior pain experience may influence pain perception and show large inter-individual differences that may also be affected by time variables.  Patient instructions provided during this appointment: There are no Patient Instructions on file for this visit.

## 2016-05-02 NOTE — Progress Notes (Signed)
Safety precautions to be maintained throughout the outpatient stay will include: orient to surroundings, keep bed in low position, maintain call bell within reach at all times, provide assistance with transfer out of bed and ambulation.  

## 2016-06-02 NOTE — Telephone Encounter (Signed)
Encounter open by mistake

## 2016-07-12 ENCOUNTER — Telehealth: Payer: Self-pay | Admitting: Pain Medicine

## 2016-07-12 NOTE — Telephone Encounter (Signed)
Error/ patient has been dismissed from [facility].

## 2016-07-17 ENCOUNTER — Encounter (HOSPITAL_COMMUNITY): Payer: Self-pay | Admitting: Family Medicine

## 2016-07-17 ENCOUNTER — Encounter: Payer: Self-pay | Admitting: Emergency Medicine

## 2016-07-17 ENCOUNTER — Observation Stay (HOSPITAL_COMMUNITY)
Admission: AD | Admit: 2016-07-17 | Discharge: 2016-07-19 | Disposition: A | Discharge status: Home / Self Care | Payer: 59 | Source: Other Acute Inpatient Hospital | Attending: Internal Medicine | Admitting: Internal Medicine

## 2016-07-17 ENCOUNTER — Emergency Department
Admission: EM | Admit: 2016-07-17 | Discharge: 2016-07-17 | Payer: 59 | Attending: Emergency Medicine | Admitting: Emergency Medicine

## 2016-07-17 DIAGNOSIS — E785 Hyperlipidemia, unspecified: Secondary | ICD-10-CM | POA: Diagnosis not present

## 2016-07-17 DIAGNOSIS — D72829 Elevated white blood cell count, unspecified: Secondary | ICD-10-CM | POA: Diagnosis not present

## 2016-07-17 DIAGNOSIS — B9681 Helicobacter pylori [H. pylori] as the cause of diseases classified elsewhere: Secondary | ICD-10-CM | POA: Diagnosis not present

## 2016-07-17 DIAGNOSIS — K921 Melena: Secondary | ICD-10-CM | POA: Diagnosis not present

## 2016-07-17 DIAGNOSIS — I10 Essential (primary) hypertension: Secondary | ICD-10-CM | POA: Diagnosis present

## 2016-07-17 DIAGNOSIS — K260 Acute duodenal ulcer with hemorrhage: Secondary | ICD-10-CM

## 2016-07-17 DIAGNOSIS — M545 Low back pain: Secondary | ICD-10-CM | POA: Diagnosis not present

## 2016-07-17 DIAGNOSIS — K269 Duodenal ulcer, unspecified as acute or chronic, without hemorrhage or perforation: Secondary | ICD-10-CM | POA: Insufficient documentation

## 2016-07-17 DIAGNOSIS — K2970 Gastritis, unspecified, without bleeding: Secondary | ICD-10-CM | POA: Diagnosis not present

## 2016-07-17 DIAGNOSIS — Z79899 Other long term (current) drug therapy: Secondary | ICD-10-CM | POA: Insufficient documentation

## 2016-07-17 DIAGNOSIS — M1990 Unspecified osteoarthritis, unspecified site: Secondary | ICD-10-CM | POA: Insufficient documentation

## 2016-07-17 DIAGNOSIS — D62 Acute posthemorrhagic anemia: Secondary | ICD-10-CM | POA: Diagnosis not present

## 2016-07-17 DIAGNOSIS — F17210 Nicotine dependence, cigarettes, uncomplicated: Secondary | ICD-10-CM | POA: Diagnosis not present

## 2016-07-17 DIAGNOSIS — K922 Gastrointestinal hemorrhage, unspecified: Secondary | ICD-10-CM | POA: Diagnosis not present

## 2016-07-17 DIAGNOSIS — K625 Hemorrhage of anus and rectum: Secondary | ICD-10-CM | POA: Diagnosis present

## 2016-07-17 DIAGNOSIS — G8929 Other chronic pain: Secondary | ICD-10-CM | POA: Diagnosis not present

## 2016-07-17 HISTORY — DX: Acute posthemorrhagic anemia: D62

## 2016-07-17 HISTORY — DX: Acute duodenal ulcer with hemorrhage: K26.0

## 2016-07-17 LAB — CBC WITH DIFFERENTIAL/PLATELET
BASOS ABS: 0.1 10*3/uL (ref 0–0.1)
BASOS PCT: 1 %
Eosinophils Absolute: 0.1 10*3/uL (ref 0–0.7)
Eosinophils Relative: 1 %
HEMATOCRIT: 34.6 % — AB (ref 40.0–52.0)
Hemoglobin: 12.2 g/dL — ABNORMAL LOW (ref 13.0–18.0)
LYMPHS PCT: 25 %
Lymphs Abs: 5.2 10*3/uL — ABNORMAL HIGH (ref 1.0–3.6)
MCH: 31.3 pg (ref 26.0–34.0)
MCHC: 35.3 g/dL (ref 32.0–36.0)
MCV: 88.5 fL (ref 80.0–100.0)
MONO ABS: 1.4 10*3/uL — AB (ref 0.2–1.0)
Monocytes Relative: 6 %
NEUTROS ABS: 14.1 10*3/uL — AB (ref 1.4–6.5)
Neutrophils Relative %: 67 %
PLATELETS: 285 10*3/uL (ref 150–440)
RBC: 3.91 MIL/uL — AB (ref 4.40–5.90)
RDW: 14.4 % (ref 11.5–14.5)
WBC: 21 10*3/uL — AB (ref 3.8–10.6)

## 2016-07-17 LAB — COMPREHENSIVE METABOLIC PANEL
ALBUMIN: 3.5 g/dL (ref 3.5–5.0)
ALK PHOS: 33 U/L — AB (ref 38–126)
ALT: 18 U/L (ref 17–63)
ANION GAP: 8 (ref 5–15)
AST: 19 U/L (ref 15–41)
BILIRUBIN TOTAL: 0.3 mg/dL (ref 0.3–1.2)
BUN: 30 mg/dL — ABNORMAL HIGH (ref 6–20)
CALCIUM: 8.4 mg/dL — AB (ref 8.9–10.3)
CO2: 21 mmol/L — ABNORMAL LOW (ref 22–32)
Chloride: 108 mmol/L (ref 101–111)
Creatinine, Ser: 0.82 mg/dL (ref 0.61–1.24)
GLUCOSE: 108 mg/dL — AB (ref 65–99)
POTASSIUM: 3.6 mmol/L (ref 3.5–5.1)
Sodium: 137 mmol/L (ref 135–145)
TOTAL PROTEIN: 5.8 g/dL — AB (ref 6.5–8.1)

## 2016-07-17 LAB — TYPE AND SCREEN
ABO/RH(D): A POS
ANTIBODY SCREEN: NEGATIVE

## 2016-07-17 MED ORDER — ONDANSETRON HCL 4 MG/2ML IJ SOLN
4.0000 mg | Freq: Four times a day (QID) | INTRAMUSCULAR | Status: DC | PRN
Start: 2016-07-17 — End: 2016-07-19

## 2016-07-17 MED ORDER — ONDANSETRON HCL 4 MG PO TABS: 4.0000 mg | ORAL_TABLET | Freq: Four times a day (QID) | ORAL | Status: DC | PRN

## 2016-07-17 MED ORDER — ONDANSETRON HCL 4 MG/2ML IJ SOLN
4.0000 mg | Freq: Once | INTRAMUSCULAR | Status: AC
  Administered 2016-07-17: 4 mg via INTRAVENOUS
  Filled 2016-07-17: qty 2

## 2016-07-17 MED ORDER — FAMOTIDINE IN NACL 20-0.9 MG/50ML-% IV SOLN
20.0000 mg | Freq: Two times a day (BID) | INTRAVENOUS | Status: DC
  Filled 2016-07-17: qty 50

## 2016-07-17 MED ORDER — AMLODIPINE BESYLATE 5 MG PO TABS: 5.0000 mg | ORAL_TABLET | Freq: Every day | ORAL | Status: DC

## 2016-07-17 MED ORDER — MORPHINE SULFATE (PF) 4 MG/ML IV SOLN
4.0000 mg | Freq: Once | INTRAVENOUS | Status: AC
  Administered 2016-07-17: 4 mg via INTRAVENOUS
  Filled 2016-07-17: qty 1

## 2016-07-17 MED ORDER — ACETAMINOPHEN 325 MG PO TABS: 650.0000 mg | ORAL_TABLET | Freq: Four times a day (QID) | ORAL | Status: DC | PRN

## 2016-07-17 MED ORDER — SODIUM CHLORIDE 0.9 % IV SOLN
Freq: Once | INTRAVENOUS | Status: AC
  Administered 2016-07-17: 18:00:00 via INTRAVENOUS

## 2016-07-17 MED ORDER — ZOLPIDEM TARTRATE 5 MG PO TABS: 5.0000 mg | ORAL_TABLET | Freq: Every evening | ORAL | Status: DC | PRN

## 2016-07-17 MED ORDER — BENAZEPRIL HCL 10 MG PO TABS: 20.0000 mg | ORAL_TABLET | Freq: Every day | ORAL | Status: DC

## 2016-07-17 MED ORDER — TIZANIDINE HCL 2 MG PO TABS
2.0000 mg | ORAL_TABLET | Freq: Four times a day (QID) | ORAL | Status: DC | PRN
  Administered 2016-07-18 (×2): 2 mg via ORAL
  Filled 2016-07-17 (×2): qty 1

## 2016-07-17 MED ORDER — MORPHINE SULFATE (PF) 2 MG/ML IV SOLN
2.0000 mg | Freq: Once | INTRAVENOUS | Status: AC
  Administered 2016-07-17: 2 mg via INTRAVENOUS
  Filled 2016-07-17: qty 1

## 2016-07-17 MED ORDER — SODIUM CHLORIDE 0.9% FLUSH
3.0000 mL | Freq: Two times a day (BID) | INTRAVENOUS | Status: DC
  Administered 2016-07-18 (×2): 3 mL via INTRAVENOUS

## 2016-07-17 MED ORDER — SODIUM CHLORIDE 0.9 % IV SOLN
INTRAVENOUS | Status: DC
  Administered 2016-07-18: 01:00:00 via INTRAVENOUS
  Administered 2016-07-18: 150 mL/h via INTRAVENOUS
  Administered 2016-07-19 (×2): via INTRAVENOUS

## 2016-07-17 MED ORDER — FAMOTIDINE IN NACL 20-0.9 MG/50ML-% IV SOLN
20.0000 mg | Freq: Once | INTRAVENOUS | Status: AC
  Administered 2016-07-17: 20 mg via INTRAVENOUS

## 2016-07-17 MED ORDER — TRAMADOL HCL 50 MG PO TABS
50.0000 mg | ORAL_TABLET | Freq: Four times a day (QID) | ORAL | Status: DC | PRN
  Administered 2016-07-18: 50 mg via ORAL
  Filled 2016-07-17: qty 1

## 2016-07-17 NOTE — ED Notes (Signed)
Pt has recent hx of NSAID use and GOODY powder use. C/o back pain with abdominal cramping. Pt has hx of chronic back pain.

## 2016-07-17 NOTE — ED Triage Notes (Signed)
Pt c/o abdominal cramping and black stool since Friday. Stools turned to liquid on Saturday and pt states he doubles over in pain at times. Pt admits to dry heaving now, but states he threw up x1 after breakfast.

## 2016-07-17 NOTE — Progress Notes (Signed)
The patient has been accepted for admission to [facility], Stepdown unit, Observation, for diagnosis of Upper GI Bleed with symptomatic, acute blood loss anemia.  Dr. Loletha Carrow of [facility] is willing to consult on this patient.  Anticipate he will need upper endoscopy in the AM.  History of excessive Goody Powder and NSAIDs use.  Also active tobacco and EtOH use.  Will likely need CIWA protocol.  Has received IV fluids, IV morphine, and protonix at [place]. WBC count of 21 noted.  He has not had any imaging.

## 2016-07-17 NOTE — ED Provider Notes (Signed)
[facility] Emergency Department Provider Note   ____________________________________________   None    (approximate)  I have reviewed the triage vital signs and the nursing notes.   HISTORY  Chief Complaint Rectal Bleeding    HPI Carl Burke is a 60 y.o. male this been having neck and back pain. He wanted to get off his narcotics so he began taking Goody powders and Aleve. He's been taking Goody powders for at least a couple months. He switch to Aleve couple days ago. He's been having some crampy abdominal pain and black tarry stools since Friday. That's 3 days ago. He's become lightheaded. His stools are now occurring approximately every hour. They aren't black and not read. He is still lightheaded. Nothing seems to make his problems better or worse. He is having crampy abdominal pain which at times is severe.  Past Medical History:  Diagnosis Date  . Acute blood loss anemia 07/17/2016  . Arthritis   . Hyperlipidemia   . Hypertension   . Lumbosacral radiculopathy at S1 (Left) 04/12/2016  . Opiate use 04/12/2016    Patient Active Problem List   Diagnosis Date Noted  . Acute GI bleeding 07/17/2016  . Acute blood loss anemia 07/17/2016  . Leukocytosis 07/17/2016  . Annual physical exam 04/13/2016  . Chronic pain 04/12/2016  . Long term current use of opiate analgesic 04/12/2016  . Long term prescription opiate use 04/12/2016  . Opiate use (300 MME/Day) 04/12/2016  . Encounter for therapeutic drug level monitoring 04/12/2016  . Encounter for pain management planning 04/12/2016  . Neurogenic pain 04/12/2016  . Musculoskeletal pain 04/12/2016  . Chronic shoulder pain (Right) 04/12/2016  . Cervical spondylosis 04/12/2016  . Cervical foraminal stenosis (C6-7) (Right) 04/12/2016  . 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, L3-4 and L5-S1) (Left) 04/12/2016  . Lumbar facet syndrome (Bilateral) (L>R) 04/12/2016  . Lumbar spondylosis 04/12/2016  . Grade 1  Anterolisthesis of L4 over L5 04/12/2016  . Lumbar facet arthropathy 04/12/2016  . Chronic sacroiliac joint pain (Right) 04/12/2016  . Chronic hip pain (Bilateral) 04/12/2016  . Muscle spasm of back 04/12/2016  . Cervicogenic headache (Location of Primary Source of Pain) (Bilateral) (R>L) 04/12/2016  . Chronic occipital neuralgia (Location of Primary Source of Pain) (Bilateral) (R>L) 04/12/2016  . Chronic upper back pain (Location of Secondary source of pain) (Bilateral) (midline) 04/12/2016  . Chronic neck pain (Location of Tertiary source of pain) (Bilateral) (R>L) 04/12/2016  . Cervical facet syndrome (Location of Tertiary source of pain) (Bilateral) (R>L) 04/12/2016  . Chronic low back pain (Bilateral) (L>R) 04/12/2016  . Chronic groin pain (Bilateral) (L>R) 04/12/2016  . 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 Chronic lower extremity pain (Bilateral) (L>R) 04/12/2016  . Chronic lumbar radicular pain (L5/S1 dermatomal distribution) (Bilateral) (L>R) 04/12/2016  . Chronic upper extremity pain (intermittent) (Left) 04/12/2016  . Essential hypertension 04/12/2016  . Hyperlipidemia 04/12/2016  . High opioid Drug tolerance 04/12/2016  . Abnormal MRI, cervical spine (02/05/2016) 04/12/2016  . Abnormal MRI, lumbar spine (01/15/2016) 04/12/2016  . Lumbosacral radiculopathy at S1 (Left) 04/12/2016    Past Surgical History:  Procedure Laterality Date  . ROTATOR CUFF REPAIR Left 2011  . ROTATOR CUFF REPAIR Right 2013    Prior to Admission medications   Medication Sig Start Date End Date Taking? Authorizing Provider  amLODipine-benazepril (LOTREL) 5-20 MG capsule Take 1 capsule by mouth  every day Patient taking differently: Take 1 capsule by mouth every morning. Take 1 capsule by mouth  every  day 04/13/16  Yes Ashok Norris, MD  Cholecalciferol (VITAMIN D) 400 UNIT/ML LIQD Take 5 drops by mouth every morning.   Yes Historical Provider, MD  fenofibrate (TRICOR) 145 MG tablet  Take 1 tablet by mouth  daily Patient taking differently: Take 145 mg by mouth daily. Take at lunch. 04/13/16  Yes Ashok Norris, MD  L-Glutamine 500 MG CAPS Take 500 mg by mouth 2 (two) times daily.   Yes Historical Provider, MD  Mag Oxide-Vit D3-Turmeric [id number] MG-UNIT-MG CAPS Take 1 capsule by mouth daily.   Yes Historical Provider, MD  omega-3 acid ethyl esters (LOVAZA) 1 g capsule Take 1 g by mouth 2 (two) times daily.   Yes Historical Provider, MD  simvastatin (ZOCOR) 40 MG tablet Take 1 tablet by mouth  every day at bedtime 04/13/16  Yes Ashok Norris, MD  tiZANidine (ZANAFLEX) 4 MG tablet Take 4 mg by mouth 3 (three) times daily as needed for muscle spasms.    Yes Historical Provider, MD  zolpidem (AMBIEN CR) 6.25 MG CR tablet Take 1 tablet (6.25 mg total) by mouth at bedtime as needed for sleep. 04/13/16  Yes Roselee Nova, MD  oxyCODONE-acetaminophen (PERCOCET) 10-325 MG tablet Take 1 tablet by mouth every 6 (six) hours as needed for pain. 05/02/16   Milinda Pointer, MD    Allergies Naproxen sodium  Family History  Problem Relation Age of Onset  . Heart disease Mother   . Cancer Sister   . Cancer Sister     Social History Social History  Substance Use Topics  . Smoking status: Current Some Day Smoker    Packs/day: 0.20    Types: Cigarettes  . Smokeless tobacco: Never Used  . Alcohol use 0.0 oz/week     Comment: Rare     Review of Systems Constitutional: No fever/chills Eyes: No visual changes. ENT: No sore throat. Cardiovascular: Denies chest pain. Respiratory: Denies shortness of breath. Gastrointestinal: See H&P Genitourinary: Negative for dysuria. Skin: Negative for rash. Neurological: Negative for headaches, focal weakness or numbness.  10-point ROS otherwise negative.  ____________________________________________   PHYSICAL EXAM:  VITAL SIGNS: ED Triage Vitals  Enc Vitals Group     BP 07/17/16 1655 (!) 98/53     Pulse Rate 07/17/16 1655 (!)  110     Resp 07/17/16 1655 18     Temp 07/17/16 1655 97.9 F (36.6 C)     Temp Source 07/17/16 1655 Oral     SpO2 07/17/16 1655 99 %     Weight 07/17/16 1655 240 lb (108.9 kg)     Height 07/17/16 1655 6' (1.829 m)     Head Circumference --      Peak Flow --      Pain Score 07/17/16 1657 8     Pain Loc --      Pain Edu? --      Excl. in [place]? --     Constitutional: Alert and oriented. Well appearing and in no acute distress. Eyes: Conjunctivae are normal. PERRL. EOMI. Head: Atraumatic. Nose: No congestion/rhinnorhea. Mouth/Throat: Mucous membranes are moist.  Oropharynx non-erythematous. Neck: No stridor.   Cardiovascular: Normal rate, regular rhythm. Grossly normal heart sounds.  Good peripheral circulation. Respiratory: Normal respiratory effort.  No retractions. Lungs CTAB. Gastrointestinal: Soft and nontender. No distention. No abdominal bruits. No CVA tenderness. Genitourinary: Rectal exam Black stool markedly Hemoccult positive Musculoskeletal: No lower extremity tenderness nor edema.  No joint effusions. Neurologic:  Normal speech and language. No gross focal neurologic  deficits are appreciated. No gait instability. Skin:  Skin is warm, dry and intact. No rash noted. Psychiatric: Mood and affect are normal. Speech and behavior are normal.  ____________________________________________   LABS (all labs ordered are listed, but only abnormal results are displayed)  Labs Reviewed  COMPREHENSIVE METABOLIC PANEL - Abnormal; Notable for the following:       Result Value   CO2 21 (*)    Glucose, Bld 108 (*)    BUN 30 (*)    Calcium 8.4 (*)    Total Protein 5.8 (*)    Alkaline Phosphatase 33 (*)    All other components within normal limits  CBC WITH DIFFERENTIAL/PLATELET - Abnormal; Notable for the following:    WBC 21.0 (*)    RBC 3.91 (*)    Hemoglobin 12.2 (*)    HCT 34.6 (*)    Neutro Abs 14.1 (*)    Lymphs Abs 5.2 (*)    Monocytes Absolute 1.4 (*)    All other  components within normal limits  TYPE AND SCREEN   ____________________________________________  EKG  EKG read and interpreted by me shows normal sinus rhythm at 96 normal axis nonspecific ST-T wave changes ____________________________________________  RADIOLOGY  \ ____________________________________________   PROCEDURES  Procedure(s) performed:   Procedures  Critical Care performed: Critical care time 45 minutes including discussing the patient with gastroenterology and getting him over to [facility] as well as reviewing the lab work and talking with the patient and his family  ____________________________________________   INITIAL IMPRESSION / [place] / ED COURSE  Pertinent labs & imaging results that were available during my care of the patient were reviewed by me and considered in my medical decision making (see chart for details).    Clinical Course     ____________________________________________   FINAL CLINICAL IMPRESSION(S) / ED DIAGNOSES  Final diagnoses:  Gastrointestinal hemorrhage with melena      NEW MEDICATIONS STARTED DURING THIS VISIT:  Discharge Medication List as of 07/17/2016  9:46 PM       Note:  This document was prepared using Dragon voice recognition software and may include unintentional dictation errors.    Nena Polio, MD 07/18/16 [phone]

## 2016-07-17 NOTE — Progress Notes (Signed)
Pt has arrived from [facility] ED via [place] to 3S13.  Pt A&Ox4, and denies pain, but still claims to have frequent black tarry stools.  MD notified of arrival.  Will continue to monitor.

## 2016-07-17 NOTE — H&P (Signed)
History and Physical  Patient Name: Carl Burke     [id number]    DOB: 15-Dec-1955    DOA: 07/17/2016 PCP: Ashok Norris, MD   Patient coming from: Home --[place] [facility]  Chief Complaint: Melena  HPI: Carl Burke is a 60 y.o. male with a past medical history significant for chronic low back pain and HTN who presents with melena for 2 days.  The patient has chronic degenerative disk disease and chronic back pain syndrome and at one time was on >>100 OMEs of opioid analgesics daily.  About six weeks ago he stopped these medicines because he was alarmed at the doses he was taking without substantial relief, with the goal of being off opioids and getting orthopedic evaluation.  He has been told unfortunately that he is not an operative candidate, but in the meantime he was taking 6-8 Goodies/BC Powders per day for the last month.  About three days ago he started to have diarrhea, and yesterday he noted the diarrhea was black and he was also having a bloating feeling, "gurgling" and nausea/dry heaves.  At no point did he have hematemesis or hematochezia.  Today, he was having a black watery or sticky BM every hour, so he came to the ER.  ED course: -Afebrile, pulse 80s, RR 20s, BP 107/63, and O2 saturation normal on room air -Na 137, K 3.6, Cr 0.8 (BUN elevated at 30), WBC 21K, Hgb 12.2 from 15.7 in May -He was administered IV famotidine, the case was discussed with Dr. Loletha Carrow from GI and [place] were asked to admit the patient given lack of GI coverage at [facility]  No previous GI bleed.  Does not take anticoagulants.  Minimal alcohol use and does not have a history of liver disease.    Review of Systems:  Review of Systems  Constitutional: Negative for chills, diaphoresis, fever and malaise/fatigue.  HENT: Negative.   Eyes: Negative.   Respiratory: Negative.   Cardiovascular: Negative.   Gastrointestinal: Positive for diarrhea, melena, nausea and vomiting. Negative for abdominal  pain, blood in stool, constipation and heartburn.       Bloating and gurgling  Genitourinary: Negative.   Musculoskeletal: Positive for back pain, myalgias and neck pain.       Chronic pain  Skin: Negative.   Neurological: Negative.  Negative for weakness.  Endo/Heme/Allergies: Negative.   Psychiatric/Behavioral: Negative.      Past Medical History:  Diagnosis Date  . Arthritis   . Hyperlipidemia   . Hypertension   . Lumbosacral radiculopathy at S1 (Left) 04/12/2016  . Opiate use 04/12/2016    Past Surgical History:  Procedure Laterality Date  . ROTATOR CUFF REPAIR Left 2011  . ROTATOR CUFF REPAIR Right 2013    Social History: Patient lives with his wife.  He is a disabled Games developer.  Patient walks unassisted.  He smokes occasionally.  Minimal alcohol use.  From [place] originally.  Allergies  Allergen Reactions  . Naproxen Sodium Rash    800 mg tablets that the MD prescribed for pain.    Family history: family history includes Cancer in his sister and sister; Heart disease in his mother.  Prior to Admission medications   Medication Sig Start Date End Date Taking? Authorizing Provider  amLODipine-benazepril (LOTREL) 5-20 MG capsule Take 1 capsule by mouth  every day Patient taking differently: Take 1 capsule by mouth every morning. Take 1 capsule by mouth  every day 04/13/16   Ashok Norris, MD  Cholecalciferol (VITAMIN D) 400 UNIT/ML  LIQD Take 5 drops by mouth every morning.    Historical Provider, MD  fenofibrate (TRICOR) 145 MG tablet Take 1 tablet by mouth  daily Patient taking differently: Take 145 mg by mouth daily. Take at lunch. 04/13/16   Ashok Norris, MD  L-Glutamine 500 MG CAPS Take 500 mg by mouth 2 (two) times daily.    Historical Provider, MD  Mag Oxide-Vit D3-Turmeric [id number] MG-UNIT-MG CAPS Take 1 capsule by mouth daily.    Historical Provider, MD  omega-3 acid ethyl esters (LOVAZA) 1 g capsule Take 1 g by mouth 2 (two) times daily.    Historical  Provider, MD  oxyCODONE-acetaminophen (PERCOCET) 10-325 MG tablet Take 1 tablet by mouth every 6 (six) hours as needed for pain. 05/02/16   Milinda Pointer, MD  simvastatin (ZOCOR) 40 MG tablet Take 1 tablet by mouth  every day at bedtime 04/13/16   Ashok Norris, MD  tiZANidine (ZANAFLEX) 4 MG tablet Take 4 mg by mouth 3 (three) times daily as needed for muscle spasms.     Historical Provider, MD  zolpidem (AMBIEN CR) 6.25 MG CR tablet Take 1 tablet (6.25 mg total) by mouth at bedtime as needed for sleep. 04/13/16   Roselee Nova, MD       Physical Exam: Temp 98.2 F (36.8 C) (Oral)  General appearance: Well-developed, adult male, alert and in no acute distress.   Eyes: Anicteric, conjunctiva pink, lids and lashes normal.     ENT: No nasal deformity, discharge, or epistaxis.  OP moist without lesions.   Lymph: No cervical or supraclavicular lymphadenopathy. Skin: Warm and dry.  No jaundice.  No suspicious rashes or lesions.  No stigmata of chronic liver disease. Cardiac: RRR, nl S1-S2, no murmurs appreciated.  Capillary refill is brisk.  JVP normal.  No LE edema.  Radial and DP pulses 2+ and symmetric. Respiratory: Normal respiratory rate and rhythm.  CTAB without rales or wheezes. Abdomen: Abdomen soft without rigidity.  No TTP. No ascites, distension.   MSK: No deformities or effusions. Neuro: Sensorium intact and responding to questions, attention normal.  Speech is fluent.  Moves all extremities equally and with normal coordination.    Psych: Behavior appropriate.  Affect normal.  No evidence of aural or visual hallucinations or delusions.       Labs on Admission:  I have personally reviewed the following studies: The metabolic panel shows elevated BUN, slightly low bicarbonate. Transaminases and bilirubin are normal. The complete blood count shows leukocytosis, mild anemia.   EKG: Independently reviewed. Rate 96, QTc normal, nonspecific T wave changes without ST segment  deviations.    Assessment/Plan 1. Acute GI bleed:  Suspect NSAID induced. -Protonix bolus dose -Will monitor hemodynamics on telemetry and trend CBC overnight -Consult to GI, appreciate cares -NPO and MIVF -No NSAIDs    2. Acute blood loss anemia:  Mild at present -Two peripheral IVs are in place -Type and screen ordered  3. HTN:  -Hold amlodipine and benazepril for now while monitoring hemodynamics overnight  4. Leukocytosis:  Low suspicion for infection.  Suspect reactive.  5. Chronic pain:  -May continue tizanidine -Hold NSAIDs obviously -Tramadol and acetaminophen were discussed with patient, will trial overnight      DVT prophylaxis: SCDs  Code Status: FULL  Family Communication: Wife at bedside  Disposition Plan: Anticipate trend CBC overnight, monitor hemodynamics, protonix, possible EGD tomorrow. Consults called: GI Admission status: OBS, stepdown   Medical decision making: Patient seen at 11:26 PM on 07/17/2016.  What exists of the patient's chart was reviewed in depth.  Clinical condition: stable appearing at present, but potentially unstable if re-bleed.          Edwin Dada [facility] Pager [phone]

## 2016-07-18 DIAGNOSIS — I10 Essential (primary) hypertension: Secondary | ICD-10-CM

## 2016-07-18 DIAGNOSIS — D62 Acute posthemorrhagic anemia: Secondary | ICD-10-CM

## 2016-07-18 DIAGNOSIS — G8929 Other chronic pain: Secondary | ICD-10-CM

## 2016-07-18 DIAGNOSIS — D72829 Elevated white blood cell count, unspecified: Secondary | ICD-10-CM

## 2016-07-18 DIAGNOSIS — M545 Low back pain: Secondary | ICD-10-CM

## 2016-07-18 DIAGNOSIS — K260 Acute duodenal ulcer with hemorrhage: Secondary | ICD-10-CM | POA: Diagnosis not present

## 2016-07-18 DIAGNOSIS — K921 Melena: Secondary | ICD-10-CM | POA: Diagnosis not present

## 2016-07-18 DIAGNOSIS — K922 Gastrointestinal hemorrhage, unspecified: Secondary | ICD-10-CM | POA: Diagnosis not present

## 2016-07-18 DIAGNOSIS — K2970 Gastritis, unspecified, without bleeding: Secondary | ICD-10-CM | POA: Diagnosis not present

## 2016-07-18 LAB — CBC
HCT: 28.8 % — ABNORMAL LOW (ref 39.0–52.0)
HCT: 29 % — ABNORMAL LOW (ref 39.0–52.0)
HCT: 30.9 % — ABNORMAL LOW (ref 39.0–52.0)
HEMOGLOBIN: 10.3 g/dL — AB (ref 13.0–17.0)
Hemoglobin: 9.7 g/dL — ABNORMAL LOW (ref 13.0–17.0)
Hemoglobin: 9.6 g/dL — ABNORMAL LOW (ref 13.0–17.0)
MCH: 29.6 pg (ref 26.0–34.0)
MCH: 30.1 pg (ref 26.0–34.0)
MCH: 30.1 pg (ref 26.0–34.0)
MCHC: 33.1 g/dL (ref 30.0–36.0)
MCHC: 33.3 g/dL (ref 30.0–36.0)
MCHC: 33.7 g/dL (ref 30.0–36.0)
MCV: 89.4 fL (ref 78.0–100.0)
MCV: 89.5 fL (ref 78.0–100.0)
MCV: 90.4 fL (ref 78.0–100.0)
PLATELETS: 236 10*3/uL (ref 150–400)
Platelets: 238 10*3/uL (ref 150–400)
Platelets: 246 10*3/uL (ref 150–400)
RBC: 3.22 MIL/uL — AB (ref 4.22–5.81)
RBC: 3.24 MIL/uL — AB (ref 4.22–5.81)
RBC: 3.42 MIL/uL — AB (ref 4.22–5.81)
RDW: 14.3 % (ref 11.5–15.5)
RDW: 14.5 % (ref 11.5–15.5)
RDW: 14.5 % (ref 11.5–15.5)
WBC: 10.5 10*3/uL (ref 4.0–10.5)
WBC: 12.4 10*3/uL — AB (ref 4.0–10.5)
WBC: 16.4 10*3/uL — ABNORMAL HIGH (ref 4.0–10.5)

## 2016-07-18 LAB — ABO/RH: ABO/RH(D): A POS

## 2016-07-18 LAB — TYPE AND SCREEN
ABO/RH(D): A POS
ANTIBODY SCREEN: NEGATIVE

## 2016-07-18 LAB — MRSA PCR SCREENING: MRSA BY PCR: NEGATIVE

## 2016-07-18 MED ORDER — MORPHINE SULFATE (PF) 2 MG/ML IV SOLN
1.0000 mg | INTRAVENOUS | Status: DC | PRN
  Administered 2016-07-18 – 2016-07-19 (×8): 1 mg via INTRAVENOUS
  Filled 2016-07-18 (×8): qty 1

## 2016-07-18 MED ORDER — PANTOPRAZOLE SODIUM 40 MG IV SOLR
40.0000 mg | Freq: Two times a day (BID) | INTRAVENOUS | Status: DC
  Administered 2016-07-18 – 2016-07-19 (×4): 40 mg via INTRAVENOUS
  Filled 2016-07-18 (×4): qty 40

## 2016-07-18 NOTE — Consult Note (Addendum)
[facility] Consult: 8:44 AM 07/18/2016  LOS: 1 day    Referring Provider: Dr Charlies Silvers  Primary Care Physician:  Ashok Norris, MD  Dr Keith Rake at [facility].  Primary Gastroenterologist: unassigned pt from [facility].       Reason for Consultation:  Melena, anemia.     HPI: Carl Burke is a 60 y.o. male. Chronic pack pain, DDD and history of high doses of opiods for pain control dating back > 8 years.  Not a candidate for spine surgery.  Referred by Dr Lynann Bologna (ortho) for pain mgt.  Has weaned off opioids.   HTN.  HLD.  Obesity.  Previous colonoscopy at age 85, in [place].   No colon pathology.  Due rescreening colonoscopy in 2019.    Pt weaned off opioids about 2/5 months ago. Pt using a lot of ASA powders for back pain: 6 to 8 packets per day.  On Sat 8/5 switched off ASA to Aleve.  On Sunday, he developed tarry, black stools starting.  Some anorexia.  On Sunday morning vomited once, after breakfast.  No blood or CGE.  No dizziness, sweats, syncope.  Went to local ED in afternoon with hourly stools but no stool since 4 PM yesterday.   No recurrent n/v.  He is hungry Vitals stable.  Hgb of 12.2, was 15.7 in 04/2016.  It is 9.7 today.  WBCs 21 K. BUN of 30, creatinine ok.     No fm hx colo rectal cancers or PUD.  2 sisters had breast cancer.     Past Medical History:  Diagnosis Date  . Acute blood loss anemia 07/17/2016  . Arthritis   . Hyperlipidemia   . Hypertension   . Lumbosacral radiculopathy at S1 (Left) 04/12/2016  . Opiate use 04/12/2016    Past Surgical History:  Procedure Laterality Date  . ROTATOR CUFF REPAIR Left 2011  . ROTATOR CUFF REPAIR Right 2013    Prior to Admission medications   Medication Sig Start Date End Date Taking? Authorizing Provider  amLODipine-benazepril (LOTREL) 5-20 MG  capsule Take 1 capsule by mouth  every day Patient taking differently: Take 1 capsule by mouth every morning. Take 1 capsule by mouth  every day 04/13/16   Ashok Norris, MD  Cholecalciferol (VITAMIN D) 400 UNIT/ML LIQD Take 5 drops by mouth every morning.    Historical Provider, MD  fenofibrate (TRICOR) 145 MG tablet Take 1 tablet by mouth  daily Patient taking differently: Take 145 mg by mouth daily. Take at lunch. 04/13/16   Ashok Norris, MD  L-Glutamine 500 MG CAPS Take 500 mg by mouth 2 (two) times daily.    Historical Provider, MD  Mag Oxide-Vit D3-Turmeric [id number] MG-UNIT-MG CAPS Take 1 capsule by mouth daily.    Historical Provider, MD  omega-3 acid ethyl esters (LOVAZA) 1 g capsule Take 1 g by mouth 2 (two) times daily.    Historical Provider, MD  oxyCODONE-acetaminophen (PERCOCET) 10-325 MG tablet Take 1 tablet by mouth every 6 (six) hours as needed for pain. 05/02/16   Milinda Pointer, MD  simvastatin (ZOCOR) 40 MG tablet Take 1 tablet by mouth  every day at bedtime 04/13/16   Ashok Norris, MD  tiZANidine (ZANAFLEX) 4 MG tablet Take 4 mg by mouth 3 (three) times daily as needed for muscle spasms.     Historical Provider, MD  zolpidem (AMBIEN CR) 6.25 MG CR tablet Take 1 tablet (6.25 mg total) by mouth at bedtime as needed for sleep. 04/13/16   Roselee Nova, MD    Scheduled Meds: . pantoprazole (PROTONIX) IV  40 mg Intravenous Q12H  . sodium chloride flush  3 mL Intravenous Q12H   Infusions: . sodium chloride 150 mL/hr at 07/18/16 0056   PRN Meds: acetaminophen, ondansetron **OR** ondansetron (ZOFRAN) IV, tiZANidine, traMADol, zolpidem   Allergies as of 07/17/2016 - Review Complete 07/17/2016  Allergen Reaction Noted  . Naproxen sodium Rash 04/12/2016    Family History  Problem Relation Age of Onset  . Heart disease Mother   . Cancer Sister   . Cancer Sister     Social History   Social History  . Marital status: Married    Spouse name: N/A  . Number of  children: N/A  . Years of education: N/A   Occupational History  . Not on file.   Social History Main Topics  . Smoking status: Current Some Day Smoker    Packs/day: 0.20    Types: Cigarettes  . Smokeless tobacco: Never Used  . Alcohol use 0.0 oz/week     Comment: Rare   . Drug use: No  . Sexual activity: Not on file   Other Topics Concern  . Not on file   Social History Narrative  . No narrative on file    REVIEW OF SYSTEMS: Constitutional:  generralyy no weakness ENT:  No nose bleeds Pulm:  No SOB or cough CV:  No palpitations, no LE edema.  GU:  No hematuria, no frequency GI:  Per HPI.  No reflux, no dysphagia Heme:  No issues with excessive bleeding or bruising.     Transfusions:  None in past Neuro:  No headaches, no peripheral tingling or numbness Derm:  No itching, no rash or sores.  Endocrine:  No sweats or chills.  No polyuria or dysuria Immunization:  Not queried.    PHYSICAL EXAM: Vital signs in last 24 hours: Vitals:   07/18/16 0340 07/18/16 0805  BP: 109/65 (!) 109/54  Pulse: 69 66  Resp: 18 16  Temp:  97.3 F (36.3 C)   Wt Readings from Last 3 Encounters:  07/17/16 108.9 kg (240 lb)  05/02/16 109.8 kg (242 lb)  04/14/16 109.8 kg (242 lb)    General: commfortable, not acutely ill.  Overweight WM. Head:  No swelling, trauma or asymmetry  Eyes:  No icterus or conj pallor Ears:  Not HOH  Nose:  No congestion or discharge Mouth:  Moist and clear mucosa Neck:  No mass, no JVD Lungs:  Clear bil.  No cough or labored breathing Heart: RRR.  No mrg Abdomen:  Soft, active BS.  NT.  ND.  No bruits, masses or HSM.  No hernias.     Musc/Skeltl: no joint redness or swelling Extremities:  No CCE  Neurologic:  Oriented x 3.  No tremor.  Moves all 4 limbs, strength not tested Skin:  No rash, no sores, no telangectasia   Psych:  Pleasant, calm.     LAB RESULTS:  Recent Labs  07/17/16 1714 07/18/16 0012 07/18/16 0530  WBC 21.0* 16.4* 12.4*  HGB  12.2* 10.3* 9.7*  HCT 34.6* 30.9* 28.8*  PLT 285 246 236   BMET Lab Results  Component Value Date   NA 137 07/17/2016   NA 140 04/13/2016   K 3.6 07/17/2016   K 4.6 04/13/2016   CL 108 07/17/2016   CL 101 04/13/2016   CO2 21 (L) 07/17/2016   CO2 21 04/13/2016   GLUCOSE 108 (H) 07/17/2016   GLUCOSE 98 04/13/2016   BUN 30 (H) 07/17/2016   BUN 13 04/13/2016   CREATININE 0.82 07/17/2016   CREATININE 0.86 04/13/2016   CALCIUM 8.4 (L) 07/17/2016   CALCIUM 9.5 04/13/2016   LFT  Recent Labs  07/17/16 1714  PROT 5.8*  ALBUMIN 3.5  AST 19  ALT 18  ALKPHOS 33*  BILITOT 0.3    ENDOSCOPIC STUDIES: Per HPI  IMPRESSION:   *  GI bleed.  Likely ASA induced. On BID IV Protonix.    *   Acute blood loss anemia.    *  Long standing chronic multilevel DDD/DJD, facet syndrome.   High dose opioid pain mgt.     PLAN:     *  EGD with MAC.  Timing to be determined. NPO for now, but allow clears if EGD slated for tomorrow.  Discussed with pt and his wife.    Azucena Freed  07/18/2016, 8:44 AM Pager: [phone]      [facility] Attending   I have taken an interval history, reviewed the chart and examined the patient. I agree with the Advanced Practitioner's note, impression and recommendations.    Melena in setting of salicylate use - plan EGD to evaluate and treat. The risks and benefits as well as alternatives of endoscopic procedure(s) have been discussed and reviewed. All questions answered. The patient agrees to proceed.  Gatha Mayer, MD, [facility] [phone] (pager) [phone] after 5 PM, weekends and holidays  07/18/2016 9:07 PM

## 2016-07-18 NOTE — Care Management Note (Signed)
Case Management Note  Patient Details  Name: Carl Burke MRN: [id number] Date of Birth: 29-Jul-1956  Subjective/Objective:   Patient from home with spouse with GIB, cont on ivfs, iv pain meds, for ERCP.  NCM will cont to follow for dc needs.                 Action/Plan:   Expected Discharge Date:                  Expected Discharge Plan:  Home/Self Care  In-House Referral:     Discharge planning Services  CM Consult  Post Acute Care Choice:    Choice offered to:     DME Arranged:    DME Agency:     HH Arranged:    HH Agency:     Status of Service:  In process, will continue to follow  If discussed at Long Length of Stay Meetings, dates discussed:    Additional Comments:  Zenon Mayo, RN 07/18/2016, 9:47 PM

## 2016-07-18 NOTE — Progress Notes (Signed)
Patient ID: Carl Burke, male   DOB: 23-Aug-1956, 60 y.o.   MRN: [id number]  PROGRESS NOTE    Carl Burke  [id number] DOB: [birth year] DOA: 07/17/2016  PCP: Ashok Norris, MD   Brief Narrative:  60 y.o. male with a past medical history significant for chronic low back pain and HTN who presented to [facility] with melena for 2 days. He has chronic back pain syndrome and at one time was on >>100 OMEs of opioid analgesics daily.  About six weeks ago he stopped these medicines because he was alarmed at the doses he was taking without substantial relief, with the goal of being off opioids and getting orthopedic evaluation.  He has been told that he is not an operative candidate, but in the meantime he was taking 6-8 Goodies/BC Powders per day for the last month.  About three days ago he started to have black and melenotic stool.  In ED, BO was 96/43, HR 69-110, RR 16-26, T max 98.6 F and oxygen saturation 100% on room air. Blood work was notable for BUN 30, glucose 108, WC count 16.4, hemoglobin 10.3. GI has seen the pt in consultation.   Assessment & Plan:   Principal Problem:   Acute GI bleeding / Acute upper GI bleed - Likely from extensive analgesia pt was taking - Keep NPO - Continue protonix 40 mg IV Q 12 hours - Plan for EGD in am - Continue IV fluids for hydration  Active Problems:   Chronic low back pain  - Will place order for low dose morphine every 2 hours as needed for severe pain    Essential hypertension - BP 109/54; off of blood pressure meds    Leukocytosis - Likely reactive - No evidence of acute infectious etiology - WBC count subsequently normalized    DVT prophylaxis: SCD's due to risk of bleeding  Code Status: full code  Family Communication: wife at the bedside this am Disposition Plan: plan for EGD tomorrow    Consultants:   GI  Procedures:   None   Antimicrobials:   None    Subjective: No overnight events.   Objective: Vitals:   07/18/16 0805 07/18/16 1114 07/18/16 1436 07/18/16 1700  BP: (!) 109/54 124/61 122/64 133/67  Pulse: 66 70 70 65  Resp: 16 17 (!) 26 18  Temp: 97.3 F (36.3 C) 97.6 F (36.4 C) 98 F (36.7 C) 98.6 F (37 C)  TempSrc: Oral Oral Oral Oral  SpO2: 99% 99% 99% 100%    Intake/Output Summary (Last 24 hours) at 07/18/16 1850 Last data filed at 07/18/16 1818  Gross per 24 hour  Intake           2897.5 ml  Output             2350 ml  Net            547.5 ml   There were no vitals filed for this visit.  Examination:  General exam: Appears calm and comfortable  Respiratory system: Clear to auscultation. Respiratory effort normal. Cardiovascular system: S1 & S2 heard, RRR. No JVD, murmurs, rubs, gallops or clicks. No pedal edema. Gastrointestinal system: Abdomen is nondistended, soft and nontender. No organomegaly or masses felt. Normal bowel sounds heard. Central nervous system: Alert and oriented. No focal neurological deficits. Extremities: Symmetric 5 x 5 power. Skin: No rashes, lesions or ulcers Psychiatry: Judgement and insight appear normal. Mood & affect appropriate.   Data Reviewed: I have personally reviewed following labs and imaging  studies  CBC:  Recent Labs Lab 07/17/16 1714 07/18/16 0012 07/18/16 0530 07/18/16 1122  WBC 21.0* 16.4* 12.4* 10.5  NEUTROABS 14.1*  --   --   --   HGB 12.2* 10.3* 9.7* 9.6*  HCT 34.6* 30.9* 28.8* 29.0*  MCV 88.5 90.4 89.4 89.5  PLT 285 246 236 [id number]   Basic Metabolic Panel:  Recent Labs Lab 07/17/16 1714  NA 137  K 3.6  CL 108  CO2 21*  GLUCOSE 108*  BUN 30*  CREATININE 0.82  CALCIUM 8.4*   GFR: Estimated Creatinine Clearance: 125.1 mL/min (by C-G formula based on SCr of 0.82 mg/dL). Liver Function Tests:  Recent Labs Lab 07/17/16 1714  AST 19  ALT 18  ALKPHOS 33*  BILITOT 0.3  PROT 5.8*  ALBUMIN 3.5   No results for input(s): LIPASE, AMYLASE in the last 168 hours. No results for input(s): AMMONIA in the last  168 hours. Coagulation Profile: No results for input(s): INR, PROTIME in the last 168 hours. Cardiac Enzymes: No results for input(s): CKTOTAL, CKMB, CKMBINDEX, TROPONINI in the last 168 hours. BNP (last 3 results) No results for input(s): PROBNP in the last 8760 hours. HbA1C: No results for input(s): HGBA1C in the last 72 hours. CBG: No results for input(s): GLUCAP in the last 168 hours. Lipid Profile: No results for input(s): CHOL, HDL, LDLCALC, TRIG, CHOLHDL, LDLDIRECT in the last 72 hours. Thyroid Function Tests: No results for input(s): TSH, T4TOTAL, FREET4, T3FREE, THYROIDAB in the last 72 hours. Anemia Panel: No results for input(s): VITAMINB12, FOLATE, FERRITIN, TIBC, IRON, RETICCTPCT in the last 72 hours. Urine analysis: No results found for: COLORURINE, APPEARANCEUR, LABSPEC, PHURINE, GLUCOSEU, HGBUR, BILIRUBINUR, KETONESUR, PROTEINUR, UROBILINOGEN, NITRITE, LEUKOCYTESUR Sepsis Labs: @LABRCNTIP (procalcitonin:4,lacticidven:4)   Recent Results (from the past 240 hour(s))  MRSA PCR Screening     Status: None   Collection Time: 07/18/16  5:36 AM  Result Value Ref Range Status   MRSA by PCR NEGATIVE NEGATIVE Final      Radiology Studies: No results found.   Scheduled Meds: . pantoprazole (PROTONIX) IV  40 mg Intravenous Q12H  . sodium chloride flush  3 mL Intravenous Q12H   Continuous Infusions: . sodium chloride 150 mL/hr at 07/18/16 1700     LOS: 1 day    Time spent: 25 minutes  Greater than 50% of the time spent on counseling and coordinating the care.   Leisa Lenz, MD [facility] Pager [phone]  If 7PM-7AM, please contact night-coverage [email or web address] Password [email or web address] 07/18/2016, 6:50 PM

## 2016-07-18 NOTE — Progress Notes (Signed)
Report given to Maudie Mercury, Therapist, sports for [employer].

## 2016-07-19 ENCOUNTER — Encounter (HOSPITAL_COMMUNITY): Payer: Self-pay

## 2016-07-19 ENCOUNTER — Observation Stay (HOSPITAL_COMMUNITY): Payer: 59 | Admitting: Anesthesiology

## 2016-07-19 ENCOUNTER — Encounter (HOSPITAL_COMMUNITY)
Admission: AD | Disposition: A | Discharge status: Home / Self Care | Payer: Self-pay | Source: Other Acute Inpatient Hospital | Attending: Internal Medicine

## 2016-07-19 DIAGNOSIS — K260 Acute duodenal ulcer with hemorrhage: Principal | ICD-10-CM

## 2016-07-19 DIAGNOSIS — B9681 Helicobacter pylori [H. pylori] as the cause of diseases classified elsewhere: Secondary | ICD-10-CM

## 2016-07-19 DIAGNOSIS — K922 Gastrointestinal hemorrhage, unspecified: Secondary | ICD-10-CM | POA: Diagnosis not present

## 2016-07-19 DIAGNOSIS — I10 Essential (primary) hypertension: Secondary | ICD-10-CM | POA: Diagnosis not present

## 2016-07-19 DIAGNOSIS — D62 Acute posthemorrhagic anemia: Secondary | ICD-10-CM | POA: Diagnosis not present

## 2016-07-19 DIAGNOSIS — K2970 Gastritis, unspecified, without bleeding: Secondary | ICD-10-CM

## 2016-07-19 HISTORY — PX: ESOPHAGOGASTRODUODENOSCOPY: SHX5428

## 2016-07-19 SURGERY — EGD (ESOPHAGOGASTRODUODENOSCOPY)
Anesthesia: Monitor Anesthesia Care

## 2016-07-19 MED ORDER — LIDOCAINE HCL (CARDIAC) 20 MG/ML IV SOLN
INTRAVENOUS | Status: DC | PRN
  Administered 2016-07-19: 100 mg via INTRAVENOUS

## 2016-07-19 MED ORDER — PROPOFOL 500 MG/50ML IV EMUL
INTRAVENOUS | Status: DC | PRN
  Administered 2016-07-19: 200 ug/kg/min via INTRAVENOUS

## 2016-07-19 MED ORDER — PANTOPRAZOLE SODIUM 40 MG PO TBEC: 40.0000 mg | DELAYED_RELEASE_TABLET | Freq: Every day | ORAL | 0 refills | Status: DC

## 2016-07-19 MED ORDER — PROPOFOL 10 MG/ML IV BOLUS: INTRAVENOUS | Status: DC | PRN

## 2016-07-19 MED ORDER — AMLODIPINE BESYLATE 5 MG PO TABS: 5.0000 mg | ORAL_TABLET | Freq: Every day | ORAL | Status: DC

## 2016-07-19 MED ORDER — PROPOFOL 10 MG/ML IV BOLUS
INTRAVENOUS | Status: DC | PRN
  Administered 2016-07-19: 100 mg via INTRAVENOUS

## 2016-07-19 NOTE — Anesthesia Preprocedure Evaluation (Signed)
Anesthesia Evaluation  Patient identified by MRN, date of birth, ID band Patient awake    Reviewed: Allergy & Precautions, NPO status , Patient's Chart, lab work & pertinent test results  Airway Mallampati: I  TM Distance: >3 FB     Dental   Pulmonary Current Smoker,    Pulmonary exam normal        Cardiovascular hypertension, Normal cardiovascular exam     Neuro/Psych  Neuromuscular disease    GI/Hepatic   Endo/Other    Renal/GU      Musculoskeletal  (+) Arthritis ,   Abdominal   Peds  Hematology  (+) anemia ,   Anesthesia Other Findings Hx narcotic use  Reproductive/Obstetrics                             Anesthesia Physical Anesthesia Plan  ASA: II  Anesthesia Plan: MAC   Post-op Pain Management:    Induction: Intravenous  Airway Management Planned: Mask  Additional Equipment:   Intra-op Plan:   Post-operative Plan:   Informed Consent: I have reviewed the patients History and Physical, chart, labs and discussed the procedure including the risks, benefits and alternatives for the proposed anesthesia with the patient or authorized representative who has indicated his/her understanding and acceptance.     Plan Discussed with: CRNA, Anesthesiologist and Surgeon  Anesthesia Plan Comments:         Anesthesia Quick Evaluation

## 2016-07-19 NOTE — Op Note (Signed)
[facility] Patient Name: Carl Burke Procedure Date : 07/19/2016 MRN: [id number] Attending MD: Gatha Mayer , MD Date of Birth: [birth year] CSN: [id number] Age: 60 Admit Type: Inpatient Procedure:                Upper GI endoscopy Indications:              Melena Providers:                Gatha Mayer, MD, Cleda Daub, RN, Elspeth Cho Tech., Technician, Tawni Carnes, CRNA Referring MD:              Medicines:                Propofol per Anesthesia, Monitored Anesthesia Care Complications:            No immediate complications. Estimated Blood Loss:     Estimated blood loss was minimal. Procedure:                Pre-Anesthesia Assessment:                           - Prior to the procedure, a History and Physical                            was performed, and patient medications and                            allergies were reviewed. The patient's tolerance of                            previous anesthesia was also reviewed. The risks                            and benefits of the procedure and the sedation                            options and risks were discussed with the patient.                            All questions were answered, and informed consent                            was obtained. Prior Anticoagulants: The patient                            last took previous NSAID medication 3 days prior to                            the procedure. ASA Grade Assessment: II - A patient                            with mild systemic disease. After reviewing the  risks and benefits, the patient was deemed in                            satisfactory condition to undergo the procedure.                           After obtaining informed consent, the endoscope was                            passed under direct vision. Throughout the                            procedure, the patient's blood pressure, pulse, and                           oxygen saturations were monitored continuously. The                            [id number]) scope was introduced through the                            mouth, and advanced to the second part of duodenum.                            The upper GI endoscopy was accomplished without                            difficulty. The patient tolerated the procedure                            well. Scope In: Scope Out: Findings:      Two non-obstructing non-bleeding cratered duodenal ulcers with pigmented       material were found in the duodenal bulb. The largest lesion was 10 mm       in largest dimension. There is no evidence of perforation.      Diffuse moderate inflammation characterized by erosions, erythema and       friability was found in the gastric antrum. Biopsies were taken with a       cold forceps for histology. Verification of patient identification for       the specimen was done. Estimated blood loss was minimal.      The exam was otherwise without abnormality.      The cardia and gastric fundus were normal on retroflexion. Impression:               - Multiple non-obstructing non-bleeding duodenal                            ulcers with pigmented material. Suspicious for                            NSAID induced etiology. There is no evidence of                            perforation.                           -  Gastritis. Biopsied.                           - The examination was otherwise normal. Moderate Sedation:      Please see anesthesia notes, moderate sedation not given Recommendation:           - Return patient to hospital ward for ongoing care.                           - Resume regular diet.                           - Continue present medications.                           - Use Protonix (pantoprazole) 40 mg PO daily.                           - Await pathology results.                           - Should be ok to go home tonight or tomorrow - I                             will follow-up the results of biopsies and arrange                            GI f/u                           stay off goody's, BC's, aspirin, NSAID for now.                           Consider return to using something like regular                            meloxicam and a PPI later                           Consider duloxetine for back pain issues? Procedure Code(s):        --- Professional ---                           [phone], Esophagogastroduodenoscopy, flexible,                            transoral; with biopsy, single or multiple Diagnosis Code(s):        --- Professional ---                           K26.9, Duodenal ulcer, unspecified as acute or                            chronic, without hemorrhage or perforation  K29.70, Gastritis, unspecified, without bleeding                           K92.1, Melena (includes Hematochezia) CPT copyright 2016 American Medical Association. All rights reserved. The codes documented in this report are preliminary and upon coder review may  be revised to meet current compliance requirements. Gatha Mayer, MD 07/19/2016 4:09:56 PM This report has been signed electronically. Number of Addenda: 0

## 2016-07-19 NOTE — Discharge Summary (Addendum)
Physician Discharge Summary  Carl Burke [id number] DOB: 1955-12-24 DOA: 07/17/2016  PCP: Ashok Norris, MD  Admit date: 07/17/2016 Discharge date: 07/19/2016  Recommendations for Outpatient Follow-up:  1. Continue protonix daily, follow up with GI in regards o pathology results   Discharge Diagnoses:  Principal Problem:   Acute duodenal ulcer with bleeding Active Problems:   Acute blood loss anemia   Essential hypertension   Leukocytosis   Gastritis    Discharge Condition: stable; pt wants to go home today   Diet recommendation: as tolerated   History of present illness:  60 y.o.malewith a past medical history significant for chronic low back pain and HTNwho presented to [facility] with melena for 2 days. He has chronic back pain syndrome and at one time was on >>100 OMEs of opioid analgesics daily. About six weeks PTA pt stopped these medicines because he was alarmed at the doses he was taking without substantial relief, with the goal of being off of opioids and getting orthopedic evaluation. However, he has been told that he is not an operative candidate so in the meantime he was taking 6-8 Goodies/BC Powders per day for the last month PTA. About three days PTA he started to have black and melenotic stool.  In ED, BO was 96/43, HR 69-110, RR 16-26, T max 98.6 F and oxygen saturation 100% on room air. Blood work was notable for BUN 30, glucose 108, WC count 16.4, hemoglobin 10.3. GI has seen the pt in consultation.    Hospital Course:    Assessment & Plan:   Principal Problem:   Acute upper GI bleed due to duodenal ulcer / Acute blood loss anemia  - Likely from use of Goody powders for pain control - Continue protonix 40 mg po daily - EGD showed Multiple non-obstructing non-bleeding duodenal ulcers with pigmented material. Suspicious for NSAID induced etiology. There is no evidence of perforation - Okay for d/c today per GI  Active Problems:   Chronic low back  pain  - Continue analgesia as per home regimen     Essential hypertension - Continue BP meds    Leukocytosis - Likely reactive - No evidence of acute infectious etiology - WBC count subsequently normalized    DVT prophylaxis: SCD's due to risk of bleeding  Code Status: full code  Family Communication: wife at the bedside this am      Signed:  Leisa Lenz, MD  [facility] 07/19/2016, 5:04 PM  Pager #: [phone]  Time spent in minutes: more than 30 minutes  Procedures:  EGD 07/19/2016 - Multiple non-obstructing non-bleeding duodenal                            ulcers with pigmented material. Suspicious for                            NSAID induced etiology. There is no evidence of perforation  Consultations:  GI  Discharge Exam: Vitals:   07/19/16 1620 07/19/16 1647  BP: 121/80 136/70  Pulse: 66 76  Resp: 19 18  Temp:  97.9 F (36.6 C)   Vitals:   07/19/16 1600 07/19/16 1610 07/19/16 1620 07/19/16 1647  BP: (!) 110/55 (!) 100/49 121/80 136/70  Pulse: 77 63 66 76  Resp: (!) 24 14 19 18   Temp: 98.4 F (36.9 C)   97.9 F (36.6 C)  TempSrc: Oral   Oral  SpO2:  100% 100% 100% 99%  Weight:        General: Pt is alert, follows commands appropriately, not in acute distress Cardiovascular: Regular rate and rhythm, S1/S2 +, no murmurs Respiratory: Clear to auscultation bilaterally, no wheezing, no crackles, no rhonchi Abdominal: Soft, non tender, non distended, bowel sounds +, no guarding Extremities: no edema, no cyanosis, pulses palpable bilaterally DP and PT Neuro: Grossly nonfocal  Discharge Instructions  Discharge Instructions    Call MD for:  persistant nausea and vomiting    Complete by:  As directed   Diet - low sodium heart healthy    Complete by:  As directed   Increase activity slowly    Complete by:  As directed       Medication List    TAKE these medications   amLODipine-benazepril 5-20 MG capsule Commonly known as:   LOTREL Take 1 capsule by mouth  every day What changed:  how much to take  how to take this  when to take this  additional instructions   fenofibrate 145 MG tablet Commonly known as:  TRICOR Take 1 tablet by mouth  daily What changed:  how much to take  how to take this  when to take this  additional instructions   L-Glutamine 500 MG Caps Take 500 mg by mouth 2 (two) times daily.   Mag Oxide-Vit D3-Turmeric [id number] MG-UNIT-MG Caps Take 1 capsule by mouth daily.   omega-3 acid ethyl esters 1 g capsule Commonly known as:  LOVAZA Take 1 g by mouth 2 (two) times daily.   pantoprazole 40 MG tablet Commonly known as:  PROTONIX Take 1 tablet (40 mg total) by mouth daily.   simvastatin 40 MG tablet Commonly known as:  ZOCOR Take 1 tablet by mouth  every day at bedtime   tiZANidine 4 MG tablet Commonly known as:  ZANAFLEX Take 4 mg by mouth 3 (three) times daily as needed for muscle spasms.   Vitamin D 400 UNIT/ML Liqd Take 5 drops by mouth every morning.   zolpidem 6.25 MG CR tablet Commonly known as:  AMBIEN CR Take 1 tablet (6.25 mg total) by mouth at bedtime as needed for sleep.      Follow-up Information    Ashok Norris, MD. Schedule an appointment as soon as possible for a visit in 2 week(s).   Specialty:  Family Medicine Contact information: [address] [place] [address] [phone]            The results of significant diagnostics from this hospitalization (including imaging, microbiology, ancillary and laboratory) are listed below for reference.    Significant Diagnostic Studies: No results found.  Microbiology: Recent Results (from the past 240 hour(s))  MRSA PCR Screening     Status: None   Collection Time: 07/18/16  5:36 AM  Result Value Ref Range Status   MRSA by PCR NEGATIVE NEGATIVE Final    Comment:        The GeneXpert MRSA Assay (FDA approved for NASAL specimens only), is one component of  a comprehensive MRSA colonization surveillance program. It is not intended to diagnose MRSA infection nor to guide or monitor treatment for MRSA infections.      Labs: Basic Metabolic Panel:  Recent Labs Lab 07/17/16 1714  NA 137  K 3.6  CL 108  CO2 21*  GLUCOSE 108*  BUN 30*  CREATININE 0.82  CALCIUM 8.4*   Liver Function Tests:  Recent Labs Lab 07/17/16 1714  AST 19  ALT 18  ALKPHOS 33*  BILITOT 0.3  PROT 5.8*  ALBUMIN 3.5   No results for input(s): LIPASE, AMYLASE in the last 168 hours. No results for input(s): AMMONIA in the last 168 hours. CBC:  Recent Labs Lab 07/17/16 1714 07/18/16 0012 07/18/16 0530 07/18/16 1122  WBC 21.0* 16.4* 12.4* 10.5  NEUTROABS 14.1*  --   --   --   HGB 12.2* 10.3* 9.7* 9.6*  HCT 34.6* 30.9* 28.8* 29.0*  MCV 88.5 90.4 89.4 89.5  PLT 285 246 236 238   Cardiac Enzymes: No results for input(s): CKTOTAL, CKMB, CKMBINDEX, TROPONINI in the last 168 hours. BNP: BNP (last 3 results) No results for input(s): BNP in the last 8760 hours.  ProBNP (last 3 results) No results for input(s): PROBNP in the last 8760 hours.  CBG: No results for input(s): GLUCAP in the last 168 hours.

## 2016-07-19 NOTE — Progress Notes (Addendum)
Patient discharged to home with wife. Both IVs and telemetry removed. Patient educated on new medication, instructed to schedule follow up appointment within two weeks, and provided information on GI bleeds. Patient provided information on smoking cessation. Patient was reminded to gather all belongings and left floor in wheelchair accompanied by RN.  Leone Brand, RN

## 2016-07-19 NOTE — Discharge Instructions (Addendum)

## 2016-07-19 NOTE — Transfer of Care (Signed)
Immediate Anesthesia Transfer of Care Note  Patient: Carl Burke  Procedure(s) Performed: Procedure(s): ESOPHAGOGASTRODUODENOSCOPY (EGD) (N/A)  Patient Location: PACU and Endoscopy Unit  Anesthesia Type:MAC  Level of Consciousness: awake, alert , oriented and patient cooperative  Airway & Oxygen Therapy: Patient Spontanous Breathing and Patient connected to nasal cannula oxygen  Post-op Assessment: Report given to RN and Post -op Vital signs reviewed and stable  Post vital signs: Reviewed and stable  Last Vitals:  Vitals:   07/19/16 1441 07/19/16 1600  BP: (!) 137/57   Pulse: 65 77  Resp: (!) 22 (!) 24  Temp:      Last Pain:  Vitals:   07/19/16 1600  TempSrc: Oral  PainSc:       Patients Stated Pain Goal: 0 ([id number])  Complications: No apparent anesthesia complications

## 2016-07-19 NOTE — Progress Notes (Signed)
Patient ID: Carl Burke, male   DOB: 12/31/1955, 60 y.o.   MRN: [id number]  PROGRESS NOTE    Carl Burke  [id number] DOB: 06/03/1956 DOA: 07/17/2016  PCP: Ashok Norris, MD   Brief Narrative:  60 y.o. male with a past medical history significant for chronic low back pain and HTN who presented to [facility] with melena for 2 days. He has chronic back pain syndrome and at one time was on >>100 OMEs of opioid analgesics daily.  About six weeks PTA pt stopped these medicines because he was alarmed at the doses he was taking without substantial relief, with the goal of being off of opioids and getting orthopedic evaluation.  However, he has been told that he is not an operative candidate so in the meantime he was taking 6-8 Goodies/BC Powders per day for the last month PTA.  About three days PTA he started to have black and melenotic stool.  In ED, BO was 96/43, HR 69-110, RR 16-26, T max 98.6 F and oxygen saturation 100% on room air. Blood work was notable for BUN 30, glucose 108, WC count 16.4, hemoglobin 10.3. GI has seen the pt in consultation.   Assessment & Plan:   Principal Problem:   Acute upper GI bleed / Acute blood loss anemia  - Likely from use of Goody powders for pain control - Keep NPO for EGD today - Continue protonix 40 mg IV Q 12 hours - Continue IV fluids for hydration  Active Problems:   Chronic low back pain  - Continue low dose IV morphine while pt NPO for EGD today    Essential hypertension - Resume Norvasc     Leukocytosis - Likely reactive - No evidence of acute infectious etiology - WBC count subsequently normalized    DVT prophylaxis: SCD's due to risk of bleeding  Code Status: full code  Family Communication: wife at the bedside this am Disposition Plan: plan for EGD today   Consultants:   GI, Dr. Silvano Rusk  Procedures:   Plan for EGD today  Antimicrobials:   None    Subjective: No overnight events.   Objective: Vitals:   07/18/16 1700 07/18/16 2010 07/19/16 0513 07/19/16 0848  BP: 133/67 120/60 (!) 101/51 (!) 147/63  Pulse: 65 73 67 65  Resp: 18 18 18 18   Temp: 98.6 F (37 C) 97.9 F (36.6 C) 97.9 F (36.6 C) 97.6 F (36.4 C)  TempSrc: Oral Oral Oral Oral  SpO2: 100% 99% 97% 100%  Weight:  109.7 kg (241 lb 14.4 oz)      Intake/Output Summary (Last 24 hours) at 07/19/16 1309 Last data filed at 07/19/16 0930  Gross per 24 hour  Intake             5320 ml  Output             1651 ml  Net             3669 ml   Filed Weights   07/18/16 2010  Weight: 109.7 kg (241 lb 14.4 oz)    Examination:  General exam: Appears calm and comfortable, no acute distress Respiratory system: Clear to auscultation. Respiratory effort normal. Cardiovascular system: S1 & S2 heard, RRR. No pedal edema. Gastrointestinal system: Abdomen is nondistended, soft and nontender. (+) BS Central nervous system: No focal neurological deficits. Extremities: Symmetric 5 x 5 power. Skin: Skin is warm and dry Psychiatry: Normal mood and behavior    Data Reviewed: I have personally reviewed following  labs and imaging studies  CBC:  Recent Labs Lab 07/17/16 1714 07/18/16 0012 07/18/16 0530 07/18/16 1122  WBC 21.0* 16.4* 12.4* 10.5  NEUTROABS 14.1*  --   --   --   HGB 12.2* 10.3* 9.7* 9.6*  HCT 34.6* 30.9* 28.8* 29.0*  MCV 88.5 90.4 89.4 89.5  PLT 285 246 236 [id number]   Basic Metabolic Panel:  Recent Labs Lab 07/17/16 1714  NA 137  K 3.6  CL 108  CO2 21*  GLUCOSE 108*  BUN 30*  CREATININE 0.82  CALCIUM 8.4*   GFR: Estimated Creatinine Clearance: 125.6 mL/min (by C-G formula based on SCr of 0.82 mg/dL). Liver Function Tests:  Recent Labs Lab 07/17/16 1714  AST 19  ALT 18  ALKPHOS 33*  BILITOT 0.3  PROT 5.8*  ALBUMIN 3.5   No results for input(s): LIPASE, AMYLASE in the last 168 hours. No results for input(s): AMMONIA in the last 168 hours. Coagulation Profile: No results for input(s): INR, PROTIME in  the last 168 hours. Cardiac Enzymes: No results for input(s): CKTOTAL, CKMB, CKMBINDEX, TROPONINI in the last 168 hours. BNP (last 3 results) No results for input(s): PROBNP in the last 8760 hours. HbA1C: No results for input(s): HGBA1C in the last 72 hours. CBG: No results for input(s): GLUCAP in the last 168 hours. Lipid Profile: No results for input(s): CHOL, HDL, LDLCALC, TRIG, CHOLHDL, LDLDIRECT in the last 72 hours. Thyroid Function Tests: No results for input(s): TSH, T4TOTAL, FREET4, T3FREE, THYROIDAB in the last 72 hours. Anemia Panel: No results for input(s): VITAMINB12, FOLATE, FERRITIN, TIBC, IRON, RETICCTPCT in the last 72 hours. Urine analysis: No results found for: COLORURINE, APPEARANCEUR, LABSPEC, PHURINE, GLUCOSEU, HGBUR, BILIRUBINUR, KETONESUR, PROTEINUR, UROBILINOGEN, NITRITE, LEUKOCYTESUR Sepsis Labs: @LABRCNTIP (procalcitonin:4,lacticidven:4)   Recent Results (from the past 240 hour(s))  MRSA PCR Screening     Status: None   Collection Time: 07/18/16  5:36 AM  Result Value Ref Range Status   MRSA by PCR NEGATIVE NEGATIVE Final      Radiology Studies: No results found.   Scheduled Meds: . pantoprazole (PROTONIX) IV  40 mg Intravenous Q12H  . sodium chloride flush  3 mL Intravenous Q12H   Continuous Infusions: . sodium chloride 150 mL/hr at 07/19/16 0641     LOS: 1 day    Time spent: 25 minutes  Greater than 50% of the time spent on counseling and coordinating the care.   Leisa Lenz, MD [facility] Pager [phone]  If 7PM-7AM, please contact night-coverage [email or web address] Password [email or web address] 07/19/2016, 1:09 PM

## 2016-07-19 NOTE — Anesthesia Postprocedure Evaluation (Signed)
Anesthesia Post Note  Patient: Carl Burke  Procedure(s) Performed: Procedure(s) (LRB): ESOPHAGOGASTRODUODENOSCOPY (EGD) (N/A)  Patient location during evaluation: PACU Anesthesia Type: MAC Level of consciousness: awake, sedated and patient cooperative Pain management: pain level controlled Vital Signs Assessment: post-procedure vital signs reviewed and stable Respiratory status: spontaneous breathing and respiratory function stable Cardiovascular status: blood pressure returned to baseline Anesthetic complications: no    Last Vitals:  Vitals:   07/19/16 1441 07/19/16 1600  BP: (!) 137/57 (!) 110/55  Pulse: 65 77  Resp: (!) 22 (!) 24  Temp:  36.9 C    Last Pain:  Vitals:   07/19/16 1600  TempSrc: Oral  PainSc:                  Rheba Diamond EDWARD

## 2016-07-28 ENCOUNTER — Encounter: Payer: Self-pay | Admitting: Internal Medicine

## 2016-07-28 ENCOUNTER — Other Ambulatory Visit: Payer: Self-pay

## 2016-07-28 DIAGNOSIS — A048 Other specified bacterial intestinal infections: Secondary | ICD-10-CM

## 2016-07-28 MED ORDER — DOXYCYCLINE HYCLATE 100 MG PO CAPS: 100.0000 mg | ORAL_CAPSULE | Freq: Two times a day (BID) | ORAL | 0 refills | Status: DC

## 2016-07-28 MED ORDER — METRONIDAZOLE 250 MG PO TABS: 250.0000 mg | ORAL_TABLET | Freq: Four times a day (QID) | ORAL | 0 refills | Status: DC

## 2016-07-28 NOTE — Progress Notes (Signed)
Has h. Pylori gastritis leading to ulcer  Needs:  1) Pantoprazole 40 mg qd (he was rxed at dc) x 2 months 2) Pepto Bismol 2 tabs (262 mg each) 4 times a day x 14 d 3) Metronidazole 250 mg 4 times a day x 14 d 4) doxycycline 100 mg 2 times a day x 14 d    In 4 weeks after treatment stops (would be 3 months from now as I want him to take the pantoprazole for 2 months) completed do H. Pylori stool antigen - dx H. Pylori gastritis and duodenal ulcer  If this can be arranged through his PCP Dr. Lucita Lora that is fine. I have cced him.

## 2016-08-01 ENCOUNTER — Encounter: Payer: Self-pay | Admitting: Family Medicine

## 2016-08-01 ENCOUNTER — Ambulatory Visit (INDEPENDENT_AMBULATORY_CARE_PROVIDER_SITE_OTHER): Payer: 59 | Admitting: Family Medicine

## 2016-08-01 VITALS — BP 118/72 | HR 86 | Temp 98.3°F | Resp 18 | Ht 72.0 in | Wt 249.8 lb

## 2016-08-01 DIAGNOSIS — B9681 Helicobacter pylori [H. pylori] as the cause of diseases classified elsewhere: Secondary | ICD-10-CM

## 2016-08-01 DIAGNOSIS — K260 Acute duodenal ulcer with hemorrhage: Secondary | ICD-10-CM | POA: Diagnosis not present

## 2016-08-01 DIAGNOSIS — M5417 Radiculopathy, lumbosacral region: Secondary | ICD-10-CM | POA: Diagnosis not present

## 2016-08-01 DIAGNOSIS — K2901 Acute gastritis with bleeding: Secondary | ICD-10-CM | POA: Insufficient documentation

## 2016-08-01 DIAGNOSIS — K2970 Gastritis, unspecified, without bleeding: Secondary | ICD-10-CM

## 2016-08-01 MED ORDER — OXYCODONE-ACETAMINOPHEN 10-325 MG PO TABS: 1.0000 | ORAL_TABLET | Freq: Three times a day (TID) | ORAL | 0 refills | Status: DC | PRN

## 2016-08-01 MED ORDER — PANTOPRAZOLE SODIUM 40 MG PO TBEC: 40.0000 mg | DELAYED_RELEASE_TABLET | Freq: Every day | ORAL | 0 refills | Status: DC

## 2016-08-01 NOTE — Progress Notes (Signed)
Name: Carl Burke   MRN: [id number]    DOB: 1956/11/26   Date:08/01/2016       Progress Note  Subjective  Chief Complaint  Chief Complaint  Patient presents with  . Hospitalization Follow-up    bleeding ulcers    HPI  Pt. Presents for Hospital follow up, he was admitted with acute upper GI bleed (passing black liquid stools for two days straight), was seen by [facility] and transferred to [facility]. He had an EGD which showed active duodenal ulcer, gastritis, testing revealed positive H.pylori gastritis and was started on 2 months of PPI plus two weeks of Doxycycline and Metronidazole. Anemia from acute GI blood loss, Hgb at discharge was 9.6g/dL.  Patient is also requesting a referral to a different pain clinic for low back pain. He was being seen by Dr. Dossie Arbour who gave him an SI injection (it works for only 1 week). He wants to be on a long-term pain control mechanism and wants to see a Pain clinic which has a Neurosurgeon on staff so he can see both if needed. Has been on Percocet in the past by Dr. Consuela Mimes and requesting a prescription for Percocet until seen by the new pain clinic   Past Medical History:  Diagnosis Date  . Acute blood loss anemia 07/17/2016  . Acute duodenal ulcer with bleeding 07/17/2016  . Arthritis   . Helicobacter pylori gastritis   . Hyperlipidemia   . Hypertension   . Lumbosacral radiculopathy at S1 (Left) 04/12/2016  . Opiate use 04/12/2016    Past Surgical History:  Procedure Laterality Date  . ESOPHAGOGASTRODUODENOSCOPY N/A 07/19/2016   Procedure: ESOPHAGOGASTRODUODENOSCOPY (EGD);  Surgeon: Gatha Mayer, MD;  Location: [facility];  Service: Endoscopy;  Laterality: N/A;  . ROTATOR CUFF REPAIR Left 2011  . ROTATOR CUFF REPAIR Right 2013    Family History  Problem Relation Age of Onset  . Heart disease Mother   . Cancer Sister   . Cancer Sister     Social History   Social History  . Marital status: Married    Spouse name: N/A  . Number  of children: N/A  . Years of education: N/A   Occupational History  . Not on file.   Social History Main Topics  . Smoking status: Current Some Day Smoker    Packs/day: 0.20    Types: Cigarettes  . Smokeless tobacco: Never Used     Comment: 1 cigarette each month  . Alcohol use 0.0 oz/week     Comment: Rare   . Drug use: No  . Sexual activity: Not on file   Other Topics Concern  . Not on file   Social History Narrative  . No narrative on file     Current Outpatient Prescriptions:  .  amLODipine-benazepril (LOTREL) 5-20 MG capsule, Take 1 capsule by mouth  every day (Patient taking differently: Take 1 capsule by mouth every morning. Take 1 capsule by mouth  every day), Disp: 90 capsule, Rfl: 1 .  doxycycline (VIBRAMYCIN) 100 MG capsule, Take 1 capsule (100 mg total) by mouth 2 (two) times daily., Disp: 28 capsule, Rfl: 0 .  fenofibrate (TRICOR) 145 MG tablet, Take 1 tablet by mouth  daily, Disp: 90 tablet, Rfl: 1 .  Mag Oxide-Vit D3-Turmeric [id number] MG-UNIT-MG CAPS, Take 1 capsule by mouth daily., Disp: , Rfl:  .  metroNIDAZOLE (FLAGYL) 250 MG tablet, Take 1 tablet (250 mg total) by mouth 4 (four) times daily., Disp: 56 tablet, Rfl: 0 .  omega-3 acid ethyl esters (LOVAZA) 1 g capsule, Take 1 g by mouth 2 (two) times daily., Disp: , Rfl:  .  pantoprazole (PROTONIX) 40 MG tablet, Take 1 tablet (40 mg total) by mouth daily., Disp: 30 tablet, Rfl: 0 .  simvastatin (ZOCOR) 40 MG tablet, Take 1 tablet by mouth  every day at bedtime, Disp: 90 tablet, Rfl: 1 .  tiZANidine (ZANAFLEX) 4 MG tablet, Take 4 mg by mouth 3 (three) times daily as needed for muscle spasms. , Disp: , Rfl:  .  zolpidem (AMBIEN CR) 6.25 MG CR tablet, Take 1 tablet (6.25 mg total) by mouth at bedtime as needed for sleep., Disp: 30 tablet, Rfl: 0 .  Cholecalciferol (VITAMIN D) 400 UNIT/ML LIQD, Take 5 drops by mouth every morning., Disp: , Rfl:  .  L-Glutamine 500 MG CAPS, Take 500 mg by mouth 2 (two) times  daily., Disp: , Rfl:  .  oxyCODONE-acetaminophen (PERCOCET) 10-325 MG tablet, , Disp: , Rfl:   Allergies  Allergen Reactions  . Naproxen Sodium Rash and Other (See Comments)    800 mg tablets that the MD prescribed for pain.     Review of Systems  Constitutional: Negative for chills and fever.  Cardiovascular: Negative for chest pain.  Gastrointestinal: Negative for abdominal pain, heartburn, nausea and vomiting.  Musculoskeletal: Positive for back pain.    Objective  Vitals:   08/01/16 1014  BP: 118/72  Pulse: 86  Resp: 18  Temp: 98.3 F (36.8 C)  TempSrc: Oral  SpO2: 97%  Weight: 249 lb 12.8 oz (113.3 kg)  Height: 6' (1.829 m)    Physical Exam  Constitutional: He is oriented to person, place, and time and well-developed, well-nourished, and in no distress.  HENT:  Head: Normocephalic and atraumatic.  Cardiovascular: Normal rate, regular rhythm and normal heart sounds.   No murmur heard. Pulmonary/Chest: Effort normal and breath sounds normal. He has no wheezes.  Abdominal: Soft. Bowel sounds are normal. There is no tenderness.  Musculoskeletal:       Lumbar back: He exhibits tenderness, pain and spasm.       Back:  Neurological: He is alert and oriented to person, place, and time.  Psychiatric: Mood, memory, affect and judgment normal.  Nursing note and vitals reviewed.      Assessment & Plan  1. Acute duodenal ulcer with bleeding Discharge summary reviewed, now on PPI therapy. Follow-up with GI - pantoprazole (PROTONIX) 40 MG tablet; Take 1 tablet (40 mg total) by mouth daily.  Dispense: 30 tablet; Refill: 0  2. Helicobacter pylori gastritis Sc[place]dt on doxycycline and metronidazole as prescribed by the Hospital. [facility] finish the course and repeat testing to be scheduled by GI  3. Lumbosacral radiculopathy at S1 (Left) Previous pain clinic notes reviewed, MRI of lumbar spine reviewed. Patient advised not to take any NSAIDs for pain due to risk of  further GI bleeding. Prescription for Percocet to be taken every 8 hours as needed provided. We will refer to pain clinic at [facility] - oxyCODONE-acetaminophen (PERCOCET) 10-325 MG tablet; Take 1 tablet by mouth every 8 (eight) hours as needed for pain.  Dispense: 90 tablet; Refill: 0 - Ambulatory referral to Pain Clinic   [facility] 08/01/2016 10:26 AM

## 2016-08-15 ENCOUNTER — Other Ambulatory Visit: Payer: Self-pay | Admitting: Family Medicine

## 2016-08-15 DIAGNOSIS — K260 Acute duodenal ulcer with hemorrhage: Secondary | ICD-10-CM

## 2016-08-29 ENCOUNTER — Telehealth: Payer: Self-pay | Admitting: Family Medicine

## 2016-09-01 NOTE — Telephone Encounter (Signed)
ERRENOUS °

## 2016-09-25 ENCOUNTER — Other Ambulatory Visit: Payer: Self-pay | Admitting: Family Medicine

## 2016-09-26 ENCOUNTER — Telehealth: Payer: Self-pay | Admitting: Family Medicine

## 2016-09-26 NOTE — Telephone Encounter (Signed)
Patient called [name] [facility] and they informed him that they did not receive the referral. Notes and referral need to be resent.

## 2016-09-27 NOTE — Telephone Encounter (Signed)
The requested information was printed and faxed to [facility] at [phone]. Confirmation was recevied.

## 2016-09-30 ENCOUNTER — Other Ambulatory Visit: Payer: Self-pay | Admitting: Family Medicine

## 2016-10-05 NOTE — Telephone Encounter (Signed)
Patient seen by another provider for refills

## 2016-11-09 ENCOUNTER — Encounter (HOSPITAL_COMMUNITY): Payer: Self-pay | Admitting: *Deleted

## 2016-11-09 ENCOUNTER — Emergency Department (HOSPITAL_COMMUNITY): Payer: 59

## 2016-11-09 ENCOUNTER — Emergency Department (HOSPITAL_COMMUNITY)
Admission: EM | Admit: 2016-11-09 | Discharge: 2016-11-09 | Disposition: A | Discharge status: Home / Self Care | Payer: 59 | Attending: Emergency Medicine | Admitting: Emergency Medicine

## 2016-11-09 DIAGNOSIS — Y939 Activity, unspecified: Secondary | ICD-10-CM | POA: Diagnosis not present

## 2016-11-09 DIAGNOSIS — F17210 Nicotine dependence, cigarettes, uncomplicated: Secondary | ICD-10-CM | POA: Insufficient documentation

## 2016-11-09 DIAGNOSIS — S6992XA Unspecified injury of left wrist, hand and finger(s), initial encounter: Secondary | ICD-10-CM

## 2016-11-09 DIAGNOSIS — I10 Essential (primary) hypertension: Secondary | ICD-10-CM | POA: Diagnosis not present

## 2016-11-09 DIAGNOSIS — Y999 Unspecified external cause status: Secondary | ICD-10-CM | POA: Diagnosis not present

## 2016-11-09 DIAGNOSIS — S61203A Unspecified open wound of left middle finger without damage to nail, initial encounter: Secondary | ICD-10-CM | POA: Diagnosis not present

## 2016-11-09 DIAGNOSIS — Z23 Encounter for immunization: Secondary | ICD-10-CM | POA: Insufficient documentation

## 2016-11-09 DIAGNOSIS — W312XXA Contact with powered woodworking and forming machines, initial encounter: Secondary | ICD-10-CM | POA: Diagnosis not present

## 2016-11-09 DIAGNOSIS — S61207A Unspecified open wound of left little finger without damage to nail, initial encounter: Secondary | ICD-10-CM | POA: Diagnosis not present

## 2016-11-09 DIAGNOSIS — Y929 Unspecified place or not applicable: Secondary | ICD-10-CM | POA: Diagnosis not present

## 2016-11-09 DIAGNOSIS — S61205A Unspecified open wound of left ring finger without damage to nail, initial encounter: Secondary | ICD-10-CM | POA: Diagnosis not present

## 2016-11-09 DIAGNOSIS — S61209A Unspecified open wound of unspecified finger without damage to nail, initial encounter: Secondary | ICD-10-CM

## 2016-11-09 LAB — CBC WITH DIFFERENTIAL/PLATELET
BASOS PCT: 0 %
Basophils Absolute: 0 10*3/uL (ref 0.0–0.1)
EOS PCT: 1 %
Eosinophils Absolute: 0.1 10*3/uL (ref 0.0–0.7)
HEMATOCRIT: 43.7 % (ref 39.0–52.0)
Hemoglobin: 14.4 g/dL (ref 13.0–17.0)
LYMPHS PCT: 16 %
Lymphs Abs: 2.5 10*3/uL (ref 0.7–4.0)
MCH: 25.2 pg — ABNORMAL LOW (ref 26.0–34.0)
MCHC: 33 g/dL (ref 30.0–36.0)
MCV: 76.4 fL — AB (ref 78.0–100.0)
MONO ABS: 0.9 10*3/uL (ref 0.1–1.0)
MONOS PCT: 6 %
NEUTROS ABS: 12.3 10*3/uL — AB (ref 1.7–7.7)
Neutrophils Relative %: 77 %
Platelets: 287 10*3/uL (ref 150–400)
RBC: 5.72 MIL/uL (ref 4.22–5.81)
RDW: 19.9 % — AB (ref 11.5–15.5)
WBC: 15.8 10*3/uL — ABNORMAL HIGH (ref 4.0–10.5)

## 2016-11-09 LAB — BASIC METABOLIC PANEL
Anion gap: 12 (ref 5–15)
BUN: 10 mg/dL (ref 6–20)
CALCIUM: 9.4 mg/dL (ref 8.9–10.3)
CO2: 22 mmol/L (ref 22–32)
CREATININE: 1.02 mg/dL (ref 0.61–1.24)
Chloride: 102 mmol/L (ref 101–111)
GFR calc Af Amer: >60 mL/min (ref >60)
GFR calc non Af Amer: >60 mL/min (ref >60)
GLUCOSE: 136 mg/dL — AB (ref 65–99)
Potassium: 3.9 mmol/L (ref 3.5–5.1)
Sodium: 136 mmol/L (ref 135–145)

## 2016-11-09 MED ORDER — TETANUS-DIPHTH-ACELL PERTUSSIS 5-2.5-18.5 LF-MCG/0.5 IM SUSP
0.5000 mL | Freq: Once | INTRAMUSCULAR | Status: AC
  Administered 2016-11-09: 0.5 mL via INTRAMUSCULAR

## 2016-11-09 MED ORDER — OXYCODONE-ACETAMINOPHEN 5-325 MG PO TABS: 1.0000 | ORAL_TABLET | Freq: Four times a day (QID) | ORAL | 0 refills | Status: DC | PRN

## 2016-11-09 MED ORDER — HYDROMORPHONE HCL 2 MG/ML IJ SOLN
2.0000 mg | Freq: Once | INTRAMUSCULAR | Status: AC
  Administered 2016-11-09: 2 mg via INTRAVENOUS

## 2016-11-09 MED ORDER — HYDROMORPHONE HCL 2 MG/ML IJ SOLN
1.0000 mg | Freq: Once | INTRAMUSCULAR | Status: AC
  Administered 2016-11-09: 1 mg via INTRAVENOUS
  Filled 2016-11-09: qty 1

## 2016-11-09 MED ORDER — CEFAZOLIN IN D5W 1 GM/50ML IV SOLN
1.0000 g | Freq: Once | INTRAVENOUS | Status: AC
  Administered 2016-11-09: 1 g via INTRAVENOUS

## 2016-11-09 MED ORDER — HYDROMORPHONE HCL 2 MG/ML IJ SOLN
2.0000 mg | Freq: Once | INTRAMUSCULAR | Status: DC
Start: 2016-11-09 — End: 2016-11-09
  Filled 2016-11-09: qty 1

## 2016-11-09 MED ORDER — SODIUM CHLORIDE 0.9 % IV BOLUS (SEPSIS): 1000.0000 mL | Freq: Once | INTRAVENOUS | Status: DC

## 2016-11-09 MED ORDER — HYDROMORPHONE HCL 2 MG/ML IJ SOLN: 2.0000 mg | Freq: Once | INTRAMUSCULAR | Status: DC

## 2016-11-09 MED ORDER — TETANUS-DIPHTH-ACELL PERTUSSIS 5-2.5-18.5 LF-MCG/0.5 IM SUSP: 0.5000 mL | Freq: Once | INTRAMUSCULAR | Status: DC

## 2016-11-09 MED ORDER — CEFAZOLIN IN D5W 1 GM/50ML IV SOLN
1.0000 g | Freq: Once | INTRAVENOUS | Status: DC
  Filled 2016-11-09: qty 50

## 2016-11-09 MED ORDER — SODIUM CHLORIDE 0.9 % IV BOLUS (SEPSIS)
1000.0000 mL | Freq: Once | INTRAVENOUS | Status: AC
  Administered 2016-11-09: 1000 mL via INTRAVENOUS

## 2016-11-09 MED ORDER — TETANUS-DIPHTH-ACELL PERTUSSIS 5-2.5-18.5 LF-MCG/0.5 IM SUSP
0.5000 mL | Freq: Once | INTRAMUSCULAR | Status: DC
  Filled 2016-11-09: qty 0.5

## 2016-11-09 MED ORDER — CEFAZOLIN IN D5W 1 GM/50ML IV SOLN: 1.0000 g | Freq: Once | INTRAVENOUS | Status: DC

## 2016-11-09 NOTE — ED Notes (Signed)
Re-paged Dr. Grandville Silos to [phone]

## 2016-11-09 NOTE — Discharge Instructions (Signed)
Take 1-2 Percocet every 4-6 hours as needed for severe pain. Please follow-up with Dr. Burney Gauze at your scheduled appointment tomorrow morning. Keep your dressing applied until you are seen by Dr. Burney Gauze. Please return to emergency department if you develop any new or worsening symptoms.

## 2016-11-09 NOTE — ED Provider Notes (Signed)
[place] DEPT Provider Note   CSN: [id number] Arrival date & time: 11/09/16  1242     History   Chief Complaint Chief Complaint  Patient presents with  . Hand Injury    HPI Quentin Gerrior is a 60 y.o. male.  HPI Patient presents to emergency department for left hand injury. Patient is a left-hand dominant male. About 1 hour ago he was using a table saw when he accidentally caught his left middle, ring, and pinky fingers and the saw. He reports severe pain at this time. His tetanus is unknown. He states he has been able to move his fingers somewhat. Symptom onset: sudden.  Course: constant.  Progression: unchanging.  Past Medical History:  Diagnosis Date  . Acute blood loss anemia 07/17/2016  . Acute duodenal ulcer with bleeding 07/17/2016  . Arthritis   . Helicobacter pylori gastritis   . Hyperlipidemia   . Hypertension   . Lumbosacral radiculopathy at S1 (Left) 04/12/2016  . Opiate use 04/12/2016    Patient Active Problem List   Diagnosis Date Noted  . Acute gastritis with hemorrhage 08/01/2016  . Helicobacter pylori gastritis   . Acute duodenal ulcer with bleeding 07/17/2016  . Acute blood loss anemia 07/17/2016  . Long term prescription opiate use 04/12/2016  . Opiate use (300 MME/Day) 04/12/2016  . Chronic shoulder pain (Right) 04/12/2016  . Cervical spondylosis 04/12/2016  . Chronic upper back pain (Location of Secondary source of pain) (Bilateral) (midline) 04/12/2016  . Essential hypertension 04/12/2016  . Lumbosacral radiculopathy at S1 (Left) 04/12/2016    Past Surgical History:  Procedure Laterality Date  . ESOPHAGOGASTRODUODENOSCOPY N/A 07/19/2016   Procedure: ESOPHAGOGASTRODUODENOSCOPY (EGD);  Surgeon: Gatha Mayer, MD;  Location[facility] ENDOSCOPY;  Service: Endoscopy;  Laterality: N/A;  . ROTATOR CUFF REPAIR Left 2011  . ROTATOR CUFF REPAIR Right 2013       Home Medications    Prior to Admission medications   Medication Sig Start Date End Date  Taking? Authorizing Provider  amLODipine-benazepril (LOTREL) 5-20 MG capsule Take 1 capsule by mouth  every day Patient taking differently: Take 1 capsule by mouth every morning. Take 1 capsule by mouth  every day 04/13/16   Ashok Norris, MD  Cholecalciferol (VITAMIN D) 400 UNIT/ML LIQD Take 5 drops by mouth every morning.    Historical Provider, MD  doxycycline (VIBRAMYCIN) 100 MG capsule Take 1 capsule (100 mg total) by mouth 2 (two) times daily. 07/28/16   Gatha Mayer, MD  fenofibrate (TRICOR) 145 MG tablet Take 1 tablet by mouth  daily 04/13/16   Ashok Norris, MD  L-Glutamine 500 MG CAPS Take 500 mg by mouth 2 (two) times daily.    Historical Provider, MD  Mag Oxide-Vit D3-Turmeric [id number] MG-UNIT-MG CAPS Take 1 capsule by mouth daily.    Historical Provider, MD  metroNIDAZOLE (FLAGYL) 250 MG tablet Take 1 tablet (250 mg total) by mouth 4 (four) times daily. 07/28/16   Gatha Mayer, MD  omega-3 acid ethyl esters (LOVAZA) 1 g capsule Take 1 g by mouth 2 (two) times daily.    Historical Provider, MD  oxyCODONE-acetaminophen (PERCOCET) 10-325 MG tablet Take 1 tablet by mouth every 8 (eight) hours as needed for pain. 08/01/16   Roselee Nova, MD  pantoprazole (PROTONIX) 40 MG tablet Take 1 tablet (40 mg total) by mouth daily. 08/01/16   Roselee Nova, MD  simvastatin (ZOCOR) 40 MG tablet Take 1 tablet by mouth  every day at bedtime 04/13/16  Ashok Norris, MD  tiZANidine (ZANAFLEX) 4 MG tablet Take 4 mg by mouth 3 (three) times daily as needed for muscle spasms.     Historical Provider, MD  zolpidem (AMBIEN CR) 6.25 MG CR tablet Take 1 tablet (6.25 mg total) by mouth at bedtime as needed for sleep. 04/13/16   Roselee Nova, MD    Family History Family History  Problem Relation Age of Onset  . Heart disease Mother   . Cancer Sister   . Cancer Sister     Social History Social History  Substance Use Topics  . Smoking status: Current Some Day Smoker    Packs/day: 0.20     Types: Cigarettes  . Smokeless tobacco: Never Used     Comment: 1 cigarette each month  . Alcohol use 0.0 oz/week     Comment: Rare      Allergies   Naproxen sodium   Review of Systems Review of Systems All other systems negative unless otherwise stated in HPI   Physical Exam Updated Vital Signs BP (!) 101/43 (BP Location: Right Arm)   Pulse 71   Temp 98.5 F (36.9 C) (Oral)   Resp 16   SpO2 96%   Physical Exam  Constitutional: He is oriented to person, place, and time. He appears well-developed and well-nourished.  HENT:  Head: Normocephalic and atraumatic.  Right Ear: External ear normal.  Left Ear: External ear normal.  Eyes: Conjunctivae are normal. No scleral icterus.  Neck: No tracheal deviation present.  Pulmonary/Chest: Effort normal. No respiratory distress.  Abdominal: He exhibits no distension.  Musculoskeletal: Normal range of motion. He exhibits tenderness.  Able to flex and extend 3,4,5 digits with and without resistance.   Neurological: He is alert and oriented to person, place, and time.  Sensation intact. Strength intact.   Skin: Skin is warm and dry.  Avulsion to distal left 3rd and 4th digits of distal finger pad. Deep avulsion to left 5th digit beginning at the DIPJ.  Psychiatric: He has a normal mood and affect. His behavior is normal.             ED Treatments / Results  Labs (all labs ordered are listed, but only abnormal results are displayed) Labs Reviewed  CBC WITH DIFFERENTIAL/PLATELET - Abnormal; Notable for the following:       Result Value   WBC 15.8 (*)    MCV 76.4 (*)    MCH 25.2 (*)    RDW 19.9 (*)    Neutro Abs 12.3 (*)    All other components within normal limits  BASIC METABOLIC PANEL - Abnormal; Notable for the following:    Glucose, Bld 136 (*)    All other components within normal limits    EKG  EKG Interpretation None       Radiology Dg Hand Complete Left  Result Date: 11/09/2016 CLINICAL DATA:   Sella injury with lacerations. EXAM: LEFT HAND - COMPLETE 3+ VIEW COMPARISON:  None. FINDINGS: Soft tissue injuries to the distal all mid all, ring and small fingers. No sign of fracture or radiopaque foreign object. IMPRESSION: Soft tissue injuries of the distal long, ring and small fingers. No fracture or radiopaque foreign object. Electronically Signed   By: Nelson Chimes M.D.   On: 11/09/2016 14:31    Procedures Procedures (including critical care time)  Medications Ordered in ED Medications  sodium chloride 0.9 % bolus 1,000 mL (1,000 mLs Intravenous New Bag/Given 11/09/16 1338)  Tdap (BOOSTRIX) injection 0.5 mL (  0.5 mLs Intramuscular Given 11/09/16 1337)  ceFAZolin (ANCEF) IVPB 1 g/50 mL premix (0 g Intravenous Stopped 11/09/16 1408)  HYDROmorphone (DILAUDID) injection 2 mg (2 mg Intravenous Given 11/09/16 1337)  HYDROmorphone (DILAUDID) injection 1 mg (1 mg Intravenous Given 11/09/16 1457)     Initial Impression / Assessment and Plan / ED Course  I have reviewed the triage vital signs and the nursing notes.  Pertinent labs & imaging results that were available during my care of the patient were reviewed by me and considered in my medical decision making (see chart for details).  Clinical Course    Patient presents with left hand trauma injuring his left third, fourth, and fifth fingers with a table saw. On initial exam, patient is extremely uncomfortable and severe pain. Concern for open fracture.  Strength and sensation intact.  He is able to flex and extend against resistance. Will obtain x-ray and give pain medicine. We will also update tetanus and start Ancef. We'll also have patient soak his hand in saline and Betadine.  Xray negative for fracture. Will consult hand surgery.   Spoke with Dr. Burney Gauze who will come evaluate the patient.  Care hand off to oncoming provider, Armstead Peaks, PA-C, who will follow up on hand surgery recommendations.    Final Clinical Impressions(s) /  ED Diagnoses   Final diagnoses:  Avulsion of fingertip, initial encounter  Injury of left hand, initial encounter  Contact with powered saw as cause of accidental injury    New Prescriptions New Prescriptions   No medications on file     Gloriann Loan, PA-C 11/09/16 [place], MD 11/10/16 [phone]

## 2016-11-09 NOTE — ED Provider Notes (Signed)
Sign out from Gloriann Loan, [place] pending evaluation by hand surgeon  Patient was evaluated by hand surgeon, Dr. Burney Gauze, who would like the patient to follow-up with him at his office tomorrow morning. Patient's wounds were dressed in the ED. Patient given IV Ancef in the ED. Dr. Bertis Ruddy consult note did not mention any further antibiotic treatment. Patient discharged with short course of Percocet for pain control. Patient to follow-up with Dr. Burney Gauze tomorrow. Patient discharged in satisfactory condition. I discussed patient case with Dr. Vanita Panda who guided the patient's management and agrees with plan.       Frederica Kuster, PA-C 11/09/16 1749    Carmin Muskrat, MD 11/09/16 2109

## 2016-11-09 NOTE — ED Triage Notes (Signed)
Pt reports cutting left middle, ring and little finger with circular saw. Pt appears near syncopal at triage, reports severe pain. Had bandage applied pta, bleeding controlled.

## 2016-11-09 NOTE — ED Notes (Signed)
Paged Dr. Burney Gauze to [phone]

## 2016-11-09 NOTE — Consult Note (Signed)
Reason for Consult: Left hand table saw injury Referring Physician: Smiley Birr Canal is an 60 y.o. male.  HPI: Status post table saw injury to dominant left hand today with a chief complaint of exposed soft tissue at the tips of the long, ring, and small fingers left hand.  Past Medical History:  Diagnosis Date  . Acute blood loss anemia 07/17/2016  . Acute duodenal ulcer with bleeding 07/17/2016  . Arthritis   . Helicobacter pylori gastritis   . Hyperlipidemia   . Hypertension   . Lumbosacral radiculopathy at S1 (Left) 04/12/2016  . Opiate use 04/12/2016    Past Surgical History:  Procedure Laterality Date  . ESOPHAGOGASTRODUODENOSCOPY N/A 07/19/2016   Procedure: ESOPHAGOGASTRODUODENOSCOPY (EGD);  Surgeon: Gatha Mayer, MD;  Location: [facility] ENDOSCOPY;  Service: Endoscopy;  Laterality: N/A;  . ROTATOR CUFF REPAIR Left 2011  . ROTATOR CUFF REPAIR Right 2013    Family History  Problem Relation Age of Onset  . Heart disease Mother   . Cancer Sister   . Cancer Sister     Social History:  reports that he has been smoking Cigarettes.  He has been smoking about 0.20 packs per day. He has never used smokeless tobacco. He reports that he drinks alcohol. He reports that he does not use drugs.  Allergies:  Allergies  Allergen Reactions  . Naproxen Sodium Rash and Other (See Comments)    800 mg tablets that the MD prescribed for pain.    Medications: Scheduled:  Results for orders placed or performed during the hospital encounter of 11/09/16 (from the past 48 hour(s))  CBC with Differential     Status: Abnormal   Collection Time: 11/09/16  1:20 PM  Result Value Ref Range   WBC 15.8 (H) 4.0 - 10.5 K/uL   RBC 5.72 4.22 - 5.81 MIL/uL   Hemoglobin 14.4 13.0 - 17.0 g/dL   HCT 43.7 39.0 - 52.0 %   MCV 76.4 (L) 78.0 - 100.0 fL   MCH 25.2 (L) 26.0 - 34.0 pg   MCHC 33.0 30.0 - 36.0 g/dL   RDW 19.9 (H) 11.5 - 15.5 %   Platelets 287 150 - 400 K/uL   Neutrophils Relative % 77 %   Neutro Abs 12.3 (H) 1.7 - 7.7 K/uL   Lymphocytes Relative 16 %   Lymphs Abs 2.5 0.7 - 4.0 K/uL   Monocytes Relative 6 %   Monocytes Absolute 0.9 0.1 - 1.0 K/uL   Eosinophils Relative 1 %   Eosinophils Absolute 0.1 0.0 - 0.7 K/uL   Basophils Relative 0 %   Basophils Absolute 0.0 0.0 - 0.1 K/uL  Basic metabolic panel     Status: Abnormal   Collection Time: 11/09/16  1:20 PM  Result Value Ref Range   Sodium 136 135 - 145 mmol/L   Potassium 3.9 3.5 - 5.1 mmol/L   Chloride 102 101 - 111 mmol/L   CO2 22 22 - 32 mmol/L   Glucose, Bld 136 (H) 65 - 99 mg/dL   BUN 10 6 - 20 mg/dL   Creatinine, Ser 1.02 0.61 - 1.24 mg/dL   Calcium 9.4 8.9 - 10.3 mg/dL   GFR calc non Af Amer >60 >60 mL/min   GFR calc Af Amer >60 >60 mL/min    Comment: (NOTE) The eGFR has been calculated using the CKD EPI equation. This calculation has not been validated in all clinical situations. eGFR's persistently <60 mL/min signify possible Chronic Kidney Disease.    Anion gap 12  5 - 15    Dg Hand Complete Left  Result Date: 11/09/2016 CLINICAL DATA:  Sella injury with lacerations. EXAM: LEFT HAND - COMPLETE 3+ VIEW COMPARISON:  None. FINDINGS: Soft tissue injuries to the distal all mid all, ring and small fingers. No sign of fracture or radiopaque foreign object. IMPRESSION: Soft tissue injuries of the distal long, ring and small fingers. No fracture or radiopaque foreign object. Electronically Signed   By: Nelson Chimes M.D.   On: 11/09/2016 14:31    Review of Systems  All other systems reviewed and are negative.  Blood pressure (!) 101/43, pulse 71, temperature 98.5 F (36.9 C), temperature source Oral, resp. rate 16, SpO2 96 %. Physical Exam  Constitutional: He appears well-developed and well-nourished.  HENT:  Head: Normocephalic and atraumatic.  Neck: Normal range of motion.  Cardiovascular: Normal rate.   Respiratory: Effort normal.  Musculoskeletal:       Left hand: He exhibits tenderness and  laceration.  Soft tissue defects to distal aspects of left long, left groin, and was small fingers. Left small finger has possible exposed tendon. Dorsal aspects all intact.  Skin: Skin is warm.  Psychiatric: He has a normal mood and affect. His behavior is normal. Judgment and thought content normal.    Assessment/Plan: Patient is a 60 year old left-hand dominant male with a table saw injury to his left hand with soft tissue loss at the tips of the long, ring, and small fingers. Radiographs show no fracture or foreign bodies. Physical examination reveals no exposed tendon or bone to the long and ring fingers. The small finger may have exposed profundus tendon in the proximal aspect of the wound. At this point I'm recommending sterile dressings to be applied. I'll see the patient in my office tomorrow for further reevaluation and discussion of possible cross finger flap to be left small finger if needed.  Alee Katen A 11/09/2016, 4:34 PM

## 2016-11-22 ENCOUNTER — Telehealth: Payer: Self-pay | Admitting: Family Medicine

## 2016-11-22 ENCOUNTER — Ambulatory Visit: Payer: 59 | Admitting: Family Medicine

## 2016-11-22 NOTE — Telephone Encounter (Signed)
Pt needs refill on Amlodine, Simvastatin, Fenofibrate. Pts wife was in a accident this morning and pt was not able to make his appt. Pt also wants [place] to look and see if there is anything else he needs refilled. Normally his wife takes care of all of his meds. [facility]. Pt request a 90 day supply due to the cost.

## 2016-11-28 NOTE — Telephone Encounter (Signed)
Medication has been refilled on 11/22/2016 and sent to [facility] per Dr. Manuella Ghazi

## 2016-12-02 ENCOUNTER — Ambulatory Visit: Payer: 59 | Admitting: Family Medicine

## 2016-12-19 ENCOUNTER — Ambulatory Visit: Payer: 59 | Admitting: Family Medicine

## 2016-12-20 ENCOUNTER — Encounter: Payer: Self-pay | Admitting: Family Medicine

## 2016-12-20 ENCOUNTER — Ambulatory Visit (INDEPENDENT_AMBULATORY_CARE_PROVIDER_SITE_OTHER): Payer: 59 | Admitting: Family Medicine

## 2016-12-20 VITALS — BP 108/68 | HR 97 | Temp 98.2°F | Resp 16 | Ht 72.0 in | Wt 252.4 lb

## 2016-12-20 DIAGNOSIS — I10 Essential (primary) hypertension: Secondary | ICD-10-CM

## 2016-12-20 DIAGNOSIS — E785 Hyperlipidemia, unspecified: Secondary | ICD-10-CM

## 2016-12-20 MED ORDER — FENOFIBRATE 145 MG PO TABS: ORAL_TABLET | ORAL | 1 refills | Status: DC

## 2016-12-20 MED ORDER — AMLODIPINE BESY-BENAZEPRIL HCL 5-20 MG PO CAPS: ORAL_CAPSULE | ORAL | 1 refills | Status: DC

## 2016-12-20 MED ORDER — SIMVASTATIN 40 MG PO TABS: 40.0000 mg | ORAL_TABLET | Freq: Every day | ORAL | 0 refills | Status: DC

## 2016-12-20 NOTE — Progress Notes (Signed)
Name: Carl Burke   MRN: [id number]    DOB: July 12, 1956   Date:12/20/2016       Progress Note  Subjective  Chief Complaint  Chief Complaint  Patient presents with  . Medication Refill    Hypertension  This is a chronic problem. The problem is unchanged. The problem is controlled. Pertinent negatives include no blurred vision, chest pain, headaches, orthopnea, palpitations or shortness of breath. Past treatments include calcium channel blockers and ACE inhibitors. There is no history of kidney disease, CAD/MI or CVA.  Hyperlipidemia  This is a chronic problem. The problem is uncontrolled. Pertinent negatives include no chest pain, leg pain, myalgias or shortness of breath. Current antihyperlipidemic treatment includes statins.     Past Medical History:  Diagnosis Date  . Acute blood loss anemia 07/17/2016  . Acute duodenal ulcer with bleeding 07/17/2016  . Arthritis   . Helicobacter pylori gastritis   . Hyperlipidemia   . Hypertension   . Lumbosacral radiculopathy at S1 (Left) 04/12/2016  . Opiate use 04/12/2016    Past Surgical History:  Procedure Laterality Date  . ESOPHAGOGASTRODUODENOSCOPY N/A 07/19/2016   Procedure: ESOPHAGOGASTRODUODENOSCOPY (EGD);  Surgeon: Gatha Mayer, MD;  Location[facility] ENDOSCOPY;  Service: Endoscopy;  Laterality: N/A;  . ROTATOR CUFF REPAIR Left 2011  . ROTATOR CUFF REPAIR Right 2013    Family History  Problem Relation Age of Onset  . Heart disease Mother   . Cancer Sister   . Cancer Sister     Social History   Social History  . Marital status: Married    Spouse name: N/A  . Number of children: N/A  . Years of education: N/A   Occupational History  . Not on file.   Social History Main Topics  . Smoking status: Current Some Day Smoker    Packs/day: 0.20    Types: Cigarettes  . Smokeless tobacco: Never Used     Comment: 1 cigarette each month  . Alcohol use 0.0 oz/week     Comment: Rare   . Drug use: No  . Sexual activity: Not on file    Other Topics Concern  . Not on file   Social History Narrative  . No narrative on file     Current Outpatient Prescriptions:  .  amLODipine-benazepril (LOTREL) 5-20 MG capsule, TAKE 1 CAPSULE BY MOUTH  EVERY DAY, Disp: 90 capsule, Rfl: 0 .  Cholecalciferol (VITAMIN D) 400 UNIT/ML LIQD, Take 5 drops by mouth every morning., Disp: , Rfl:  .  doxycycline (VIBRAMYCIN) 100 MG capsule, Take 1 capsule (100 mg total) by mouth 2 (two) times daily., Disp: 28 capsule, Rfl: 0 .  fenofibrate (TRICOR) 145 MG tablet, Take 1 tablet by mouth  daily, Disp: 90 tablet, Rfl: 1 .  L-Glutamine 500 MG CAPS, Take 500 mg by mouth 2 (two) times daily., Disp: , Rfl:  .  Mag Oxide-Vit D3-Turmeric [id number] MG-UNIT-MG CAPS, Take 1 capsule by mouth daily., Disp: , Rfl:  .  metroNIDAZOLE (FLAGYL) 250 MG tablet, Take 1 tablet (250 mg total) by mouth 4 (four) times daily., Disp: 56 tablet, Rfl: 0 .  omega-3 acid ethyl esters (LOVAZA) 1 g capsule, Take 1 g by mouth 2 (two) times daily., Disp: , Rfl:  .  oxyCODONE-acetaminophen (PERCOCET/ROXICET) 5-325 MG tablet, Take 1-2 tablets by mouth every 6 (six) hours as needed for severe pain., Disp: 6 tablet, Rfl: 0 .  pantoprazole (PROTONIX) 40 MG tablet, Take 1 tablet (40 mg total) by mouth daily., Disp: 30 tablet,  Rfl: 0 .  simvastatin (ZOCOR) 40 MG tablet, TAKE 1 TABLET BY MOUTH  EVERY DAY AT BEDTIME, Disp: 90 tablet, Rfl: 0 .  tiZANidine (ZANAFLEX) 4 MG tablet, Take 4 mg by mouth 3 (three) times daily as needed for muscle spasms. , Disp: , Rfl:  .  zolpidem (AMBIEN CR) 6.25 MG CR tablet, Take 1 tablet (6.25 mg total) by mouth at bedtime as needed for sleep., Disp: 30 tablet, Rfl: 0  Allergies  Allergen Reactions  . Naproxen Sodium Rash and Other (See Comments)    800 mg tablets that the MD prescribed for pain.     Review of Systems  Eyes: Negative for blurred vision.  Respiratory: Negative for shortness of breath.   Cardiovascular: Negative for chest pain,  palpitations and orthopnea.  Musculoskeletal: Negative for myalgias.  Neurological: Negative for headaches.     Objective  Vitals:   12/20/16 1440  BP: 108/68  Pulse: 97  Resp: 16  Temp: 98.2 F (36.8 C)  TempSrc: Oral  SpO2: 97%  Weight: 252 lb 6.4 oz (114.5 kg)  Height: 6' (1.829 m)    Physical Exam  Constitutional: He is oriented to person, place, and time and well-developed, well-nourished, and in no distress.  HENT:  Head: Normocephalic and atraumatic.  Cardiovascular: Normal rate, regular rhythm and normal heart sounds.   No murmur heard. Pulmonary/Chest: Effort normal and breath sounds normal. He has no wheezes.  Abdominal: Soft. Bowel sounds are normal. There is no tenderness.  Musculoskeletal:       Lumbar back: He exhibits no tenderness, no pain and no spasm.  Neurological: He is alert and oriented to person, place, and time.  Psychiatric: Mood, memory, affect and judgment normal.  Nursing note and vitals reviewed.        Assessment & Plan  1. Essential hypertension  - amLODipine-benazepril (LOTREL) 5-20 MG capsule; TAKE 1 CAPSULE BY MOUTH  EVERY DAY  Dispense: 90 capsule; Refill: 1  2. Hyperlipidemia, unspecified hyperlipidemia type  - Lipid Profile - COMPLETE METABOLIC PANEL WITH GFR - fenofibrate (TRICOR) 145 MG tablet; Take 1 tablet by mouth  daily  Dispense: 90 tablet; Refill: 1 - simvastatin (ZOCOR) 40 MG tablet; Take 1 tablet (40 mg total) by mouth at bedtime.  Dispense: 90 tablet; Refill: 0   Shandreka Dante Asad A. [facility] 12/20/2016 3:00 PM

## 2017-01-18 ENCOUNTER — Other Ambulatory Visit: Payer: Self-pay

## 2017-01-18 DIAGNOSIS — E785 Hyperlipidemia, unspecified: Secondary | ICD-10-CM

## 2017-01-18 DIAGNOSIS — I10 Essential (primary) hypertension: Secondary | ICD-10-CM

## 2017-01-19 LAB — COMPREHENSIVE METABOLIC PANEL
ALT: 28 IU/L (ref 0–44)
AST: 24 IU/L (ref 0–40)
Albumin/Globulin Ratio: 1.7 (ref 1.2–2.2)
Albumin: 4.3 g/dL (ref 3.5–5.5)
Alkaline Phosphatase: 58 IU/L (ref 39–117)
BUN/Creatinine Ratio: 13 (ref 9–20)
BUN: 9 mg/dL (ref 6–24)
Bilirubin Total: 0.4 mg/dL (ref 0.0–1.2)
CHLORIDE: 101 mmol/L (ref 96–106)
CO2: 22 mmol/L (ref 18–29)
CREATININE: 0.7 mg/dL — AB (ref 0.76–1.27)
Calcium: 9.8 mg/dL (ref 8.7–10.2)
GFR calc Af Amer: 120 mL/min/{1.73_m2} (ref >59)
GFR calc non Af Amer: 104 mL/min/{1.73_m2} (ref >59)
GLUCOSE: 95 mg/dL (ref 65–99)
Globulin, Total: 2.5 g/dL (ref 1.5–4.5)
Potassium: 4.7 mmol/L (ref 3.5–5.2)
Sodium: 143 mmol/L (ref 134–144)
Total Protein: 6.8 g/dL (ref 6.0–8.5)

## 2017-01-19 LAB — LIPID PANEL
CHOL/HDL RATIO: 5.6 ratio — AB (ref 0.0–5.0)
Cholesterol, Total: 209 mg/dL — ABNORMAL HIGH (ref 100–199)
HDL: 37 mg/dL — AB (ref >39)
LDL CALC: 127 mg/dL — AB (ref 0–99)
Triglycerides: 227 mg/dL — ABNORMAL HIGH (ref 0–149)
VLDL CHOLESTEROL CAL: 45 mg/dL — AB (ref 5–40)

## 2017-05-06 ENCOUNTER — Other Ambulatory Visit: Payer: Self-pay | Admitting: Family Medicine

## 2017-05-06 DIAGNOSIS — E785 Hyperlipidemia, unspecified: Secondary | ICD-10-CM

## 2017-05-09 NOTE — Telephone Encounter (Signed)
Medication has been refilled and sent to [facility] 

## 2017-06-03 ENCOUNTER — Other Ambulatory Visit: Payer: Self-pay | Admitting: Family Medicine

## 2017-06-03 DIAGNOSIS — E785 Hyperlipidemia, unspecified: Secondary | ICD-10-CM

## 2017-06-19 ENCOUNTER — Ambulatory Visit: Payer: 59 | Admitting: Family Medicine

## 2017-06-21 ENCOUNTER — Ambulatory Visit (INDEPENDENT_AMBULATORY_CARE_PROVIDER_SITE_OTHER): Payer: 59 | Admitting: Family Medicine

## 2017-06-21 ENCOUNTER — Encounter: Payer: Self-pay | Admitting: Family Medicine

## 2017-06-21 VITALS — BP 115/73 | HR 98 | Temp 97.5°F | Resp 17 | Ht 72.0 in | Wt 252.4 lb

## 2017-06-21 DIAGNOSIS — I10 Essential (primary) hypertension: Secondary | ICD-10-CM

## 2017-06-21 DIAGNOSIS — M62830 Muscle spasm of back: Secondary | ICD-10-CM

## 2017-06-21 DIAGNOSIS — E782 Mixed hyperlipidemia: Secondary | ICD-10-CM

## 2017-06-21 DIAGNOSIS — G4700 Insomnia, unspecified: Secondary | ICD-10-CM

## 2017-06-21 MED ORDER — TIZANIDINE HCL 4 MG PO TABS: 4.0000 mg | ORAL_TABLET | Freq: Three times a day (TID) | ORAL | 0 refills | Status: DC | PRN

## 2017-06-21 MED ORDER — ZOLPIDEM TARTRATE ER 6.25 MG PO TBCR: 6.2500 mg | EXTENDED_RELEASE_TABLET | Freq: Every evening | ORAL | 2 refills | Status: DC | PRN

## 2017-06-21 MED ORDER — ICOSAPENT ETHYL 1 G PO CAPS: 2.0000 | ORAL_CAPSULE | Freq: Two times a day (BID) | ORAL | 0 refills | Status: DC

## 2017-06-21 MED ORDER — SIMVASTATIN 40 MG PO TABS: 40.0000 mg | ORAL_TABLET | Freq: Every day | ORAL | 0 refills | Status: DC

## 2017-06-21 MED ORDER — AMLODIPINE BESY-BENAZEPRIL HCL 5-20 MG PO CAPS: ORAL_CAPSULE | ORAL | 1 refills | Status: DC

## 2017-06-21 NOTE — Progress Notes (Signed)
Name: Carl Burke   MRN: [id number]    DOB: [birth year]   Date:06/21/2017       Progress Note  Subjective  Chief Complaint  Chief Complaint  Patient presents with  . Follow-up    6 mo  . Medication Refill    Hypertension  This is a chronic problem. The problem is unchanged. The problem is controlled. Pertinent negatives include no blurred vision, chest pain, headaches, orthopnea, palpitations or shortness of breath. Past treatments include calcium channel blockers and ACE inhibitors. There is no history of kidney disease, CAD/MI or CVA.  Hyperlipidemia  This is a chronic problem. The problem is uncontrolled. Pertinent negatives include no chest pain, leg pain, myalgias or shortness of breath. Current antihyperlipidemic treatment includes statins.  Insomnia  Primary symptoms: sleep disturbance, frequent awakening.  The onset quality is gradual. The problem occurs nightly. Past treatments include medication. Typical bedtime:  10-11 P.M..  How long after going to bed to you fall asleep: 15-30 minutes.   PMH includes: chronic pain.     Past Medical History:  Diagnosis Date  . Acute blood loss anemia 07/17/2016  . Acute duodenal ulcer with bleeding 07/17/2016  . Arthritis   . Helicobacter pylori gastritis   . Hyperlipidemia   . Hypertension   . Lumbosacral radiculopathy at S1 (Left) 04/12/2016  . Opiate use 04/12/2016    Past Surgical History:  Procedure Laterality Date  . ESOPHAGOGASTRODUODENOSCOPY N/A 07/19/2016   Procedure: ESOPHAGOGASTRODUODENOSCOPY (EGD);  Surgeon: Gatha Mayer, MD;  Location: [facility] ENDOSCOPY;  Service: Endoscopy;  Laterality: N/A;  . ROTATOR CUFF REPAIR Left 2011  . ROTATOR CUFF REPAIR Right 2013    Family History  Problem Relation Age of Onset  . Heart disease Mother   . Cancer Sister   . Cancer Sister     Social History   Social History  . Marital status: Married    Spouse name: N/A  . Number of children: N/A  . Years of education: N/A    Occupational History  . Not on file.   Social History Main Topics  . Smoking status: Current Some Day Smoker    Packs/day: 0.20    Types: Cigarettes  . Smokeless tobacco: Never Used     Comment: 1 cigarette each month  . Alcohol use 0.0 oz/week     Comment: Rare   . Drug use: No  . Sexual activity: Not on file   Other Topics Concern  . Not on file   Social History Narrative  . No narrative on file     Current Outpatient Prescriptions:  .  amLODipine-benazepril (LOTREL) 5-20 MG capsule, TAKE 1 CAPSULE BY MOUTH  EVERY DAY, Disp: 90 capsule, Rfl: 1 .  Cholecalciferol (VITAMIN D) 400 UNIT/ML LIQD, Take 5 drops by mouth every morning., Disp: , Rfl:  .  doxycycline (VIBRAMYCIN) 100 MG capsule, Take 1 capsule (100 mg total) by mouth 2 (two) times daily., Disp: 28 capsule, Rfl: 0 .  fenofibrate (TRICOR) 145 MG tablet, Take 1 tablet by mouth  daily, Disp: 90 tablet, Rfl: 1 .  L-Glutamine 500 MG CAPS, Take 500 mg by mouth 2 (two) times daily., Disp: , Rfl:  .  Mag Oxide-Vit D3-Turmeric [id number] MG-UNIT-MG CAPS, Take 1 capsule by mouth daily., Disp: , Rfl:  .  metroNIDAZOLE (FLAGYL) 250 MG tablet, Take 1 tablet (250 mg total) by mouth 4 (four) times daily., Disp: 56 tablet, Rfl: 0 .  omega-3 acid ethyl esters (LOVAZA) 1 g capsule, Take 1 g  by mouth 2 (two) times daily., Disp: , Rfl:  .  oxyCODONE-acetaminophen (PERCOCET/ROXICET) 5-325 MG tablet, Take 1-2 tablets by mouth every 6 (six) hours as needed for severe pain., Disp: 6 tablet, Rfl: 0 .  pantoprazole (PROTONIX) 40 MG tablet, Take 1 tablet (40 mg total) by mouth daily., Disp: 30 tablet, Rfl: 0 .  simvastatin (ZOCOR) 40 MG tablet, TAKE 1 TABLET BY MOUTH AT  BEDTIME, Disp: 90 tablet, Rfl: 0 .  tiZANidine (ZANAFLEX) 4 MG tablet, Take 4 mg by mouth 3 (three) times daily as needed for muscle spasms. , Disp: , Rfl:  .  zolpidem (AMBIEN CR) 6.25 MG CR tablet, Take 1 tablet (6.25 mg total) by mouth at bedtime as needed for sleep., Disp:  30 tablet, Rfl: 0  Allergies  Allergen Reactions  . Naproxen Sodium Rash and Other (See Comments)    800 mg tablets that the MD prescribed for pain.     Review of Systems  Eyes: Negative for blurred vision.  Respiratory: Negative for shortness of breath.   Cardiovascular: Negative for chest pain, palpitations and orthopnea.  Musculoskeletal: Negative for myalgias.  Neurological: Negative for headaches.  Psychiatric/Behavioral: Positive for sleep disturbance. The patient has insomnia.      Objective  Vitals:   06/21/17 1437  BP: 115/73  Pulse: 98  Resp: 17  Temp: (!) 97.5 F (36.4 C)  TempSrc: Oral  SpO2: 96%  Weight: 252 lb 6.4 oz (114.5 kg)  Height: 6' (1.829 m)    Physical Exam  Constitutional: He is oriented to person, place, and time and well-developed, well-nourished, and in no distress.  HENT:  Head: Normocephalic and atraumatic.  Cardiovascular: Normal rate, regular rhythm and normal heart sounds.   No murmur heard. Pulmonary/Chest: Effort normal and breath sounds normal. He has no wheezes.  Neurological: He is alert and oriented to person, place, and time.  Psychiatric: Mood, memory, affect and judgment normal.  Nursing note and vitals reviewed.      Assessment & Plan  1. Essential hypertension BP stable on present antihypertensive therapy - amLODipine-benazepril (LOTREL) 5-20 MG capsule; TAKE 1 CAPSULE BY MOUTH  EVERY DAY  Dispense: 90 capsule; Refill: 1  2. Mixed hyperlipidemia DC fenofibrate, start on Vascepa, continue on statin - simvastatin (ZOCOR) 40 MG tablet; Take 1 tablet (40 mg total) by mouth at bedtime.  Dispense: 90 tablet; Refill: 0 - Icosapent Ethyl (VASCEPA) 1 g CAPS; Take 2 capsules by mouth 2 (two) times daily after a meal.  Dispense: 360 capsule; Refill: 0  3. Insomnia, unspecified type Stable and responsive to Ambien taken nightly as needed - zolpidem (AMBIEN CR) 6.25 MG CR tablet; Take 1 tablet (6.25 mg total) by mouth at  bedtime as needed for sleep.  Dispense: 30 tablet; Refill: 2  4. Back muscle spasm Being followed by pain management, takes tizanidine for relief of muscle spasm - tiZANidine (ZANAFLEX) 4 MG tablet; Take 1 tablet (4 mg total) by mouth 3 (three) times daily as needed for muscle spasms.  Dispense: 270 tablet; Refill: 0   Dandria Griego Asad A. [facility] 06/21/2017 3:29 PM

## 2017-06-29 ENCOUNTER — Ambulatory Visit: Payer: 59 | Admitting: Family Medicine

## 2017-08-09 ENCOUNTER — Other Ambulatory Visit: Payer: Self-pay | Admitting: Family Medicine

## 2017-08-09 DIAGNOSIS — E782 Mixed hyperlipidemia: Secondary | ICD-10-CM

## 2017-09-06 ENCOUNTER — Other Ambulatory Visit: Payer: Self-pay | Admitting: Family Medicine

## 2017-09-06 DIAGNOSIS — M62830 Muscle spasm of back: Secondary | ICD-10-CM

## 2017-09-06 DIAGNOSIS — E785 Hyperlipidemia, unspecified: Secondary | ICD-10-CM

## 2017-09-06 DIAGNOSIS — E782 Mixed hyperlipidemia: Secondary | ICD-10-CM

## 2017-09-06 NOTE — Telephone Encounter (Signed)
Patient is a refill on the following medications to be sent to his mail order pharmacy [facility]:  Amlodipine-benazepril 5-20mg  Simvastatin 40mg  Tizandidine 4mg  Zolpidem 6.25 mg Fenofibrate 145mg  (patient prefers this one due to the cheaper cost)  Please let patient know when it is complete.

## 2017-09-06 NOTE — Telephone Encounter (Signed)
Patient stated that rx need to be for a 3 month supply due to cost and insurance.

## 2017-09-21 ENCOUNTER — Ambulatory Visit (INDEPENDENT_AMBULATORY_CARE_PROVIDER_SITE_OTHER): Payer: 59 | Admitting: Family Medicine

## 2017-09-21 ENCOUNTER — Encounter: Payer: Self-pay | Admitting: Family Medicine

## 2017-09-21 DIAGNOSIS — I10 Essential (primary) hypertension: Secondary | ICD-10-CM

## 2017-09-21 DIAGNOSIS — E782 Mixed hyperlipidemia: Secondary | ICD-10-CM

## 2017-09-21 DIAGNOSIS — G4700 Insomnia, unspecified: Secondary | ICD-10-CM

## 2017-09-21 MED ORDER — ZOLPIDEM TARTRATE ER 6.25 MG PO TBCR: 6.2500 mg | EXTENDED_RELEASE_TABLET | Freq: Every evening | ORAL | 0 refills | Status: DC | PRN

## 2017-09-21 MED ORDER — AMLODIPINE BESY-BENAZEPRIL HCL 5-20 MG PO CAPS: ORAL_CAPSULE | ORAL | 1 refills | Status: DC

## 2017-09-21 NOTE — Progress Notes (Signed)
Name: Carl Burke   MRN: [id number]    DOB: Aug 08, 1956   Date:09/21/2017       Progress Note  Subjective  Chief Complaint  Chief Complaint  Patient presents with  . Follow-up    3 month recheck,   . Medication Refill    Hypertension  This is a chronic problem. The problem is unchanged. The problem is controlled. Pertinent negatives include no blurred vision, chest pain, headaches, orthopnea, palpitations or shortness of breath. Past treatments include calcium channel blockers and ACE inhibitors. There is no history of kidney disease, CAD/MI or CVA.  Hyperlipidemia  This is a chronic problem. The problem is uncontrolled. Recent lipid tests were reviewed and are high. Exacerbating diseases include obesity. Pertinent negatives include no chest pain, leg pain, myalgias or shortness of breath. Current antihyperlipidemic treatment includes statins (has stopped taking Vascepa becasue of cost). Risk factors for coronary artery disease include dyslipidemia.  Insomnia  Primary symptoms: sleep disturbance, frequent awakening.  The onset quality is gradual. The problem occurs nightly. Past treatments include medication. Typical bedtime:  10-11 P.M..  How long after going to bed to you fall asleep: 15-30 minutes.   PMH includes: chronic pain.      Past Medical History:  Diagnosis Date  . Acute blood loss anemia 07/17/2016  . Acute duodenal ulcer with bleeding 07/17/2016  . Arthritis   . Helicobacter pylori gastritis   . Hyperlipidemia   . Hypertension   . Lumbosacral radiculopathy at S1 (Left) 04/12/2016  . Opiate use 04/12/2016    Past Surgical History:  Procedure Laterality Date  . ESOPHAGOGASTRODUODENOSCOPY N/A 07/19/2016   Procedure: ESOPHAGOGASTRODUODENOSCOPY (EGD);  Surgeon: Gatha Mayer, MD;  Location: [facility] ENDOSCOPY;  Service: Endoscopy;  Laterality: N/A;  . ROTATOR CUFF REPAIR Left 2011  . ROTATOR CUFF REPAIR Right 2013    Family History  Problem Relation Age of Onset  . Heart  disease Mother   . Cancer Sister   . Cancer Sister     Social History   Social History  . Marital status: Married    Spouse name: N/A  . Number of children: N/A  . Years of education: N/A   Occupational History  . Not on file.   Social History Main Topics  . Smoking status: Current Some Day Smoker    Packs/day: 0.20    Types: Cigarettes  . Smokeless tobacco: Never Used     Comment: 1 cigarette each month  . Alcohol use 0.0 oz/week     Comment: Rare   . Drug use: No  . Sexual activity: Not on file   Other Topics Concern  . Not on file   Social History Narrative  . No narrative on file     Current Outpatient Prescriptions:  .  amLODipine-benazepril (LOTREL) 5-20 MG capsule, TAKE 1 CAPSULE BY MOUTH  EVERY DAY, Disp: 90 capsule, Rfl: 1 .  Cholecalciferol (VITAMIN D) 400 UNIT/ML LIQD, Take 5 drops by mouth every morning., Disp: , Rfl:  .  doxycycline (VIBRAMYCIN) 100 MG capsule, Take 1 capsule (100 mg total) by mouth 2 (two) times daily., Disp: 28 capsule, Rfl: 0 .  L-Glutamine 500 MG CAPS, Take 500 mg by mouth 2 (two) times daily., Disp: , Rfl:  .  Mag Oxide-Vit D3-Turmeric [phone] MG-UNIT-MG CAPS, Take 1 capsule by mouth daily., Disp: , Rfl:  .  pantoprazole (PROTONIX) 40 MG tablet, Take 1 tablet (40 mg total) by mouth daily., Disp: 30 tablet, Rfl: 0 .  simvastatin (ZOCOR) 40  MG tablet, Take 1 tablet (40 mg total) by mouth at bedtime., Disp: 90 tablet, Rfl: 0 .  tiZANidine (ZANAFLEX) 4 MG tablet, TAKE 1 TABLET BY MOUTH 3  TIMES DAILY AS NEEDED FOR  MUSCLE SPASM(S), Disp: 270 tablet, Rfl: 0 .  zolpidem (AMBIEN CR) 6.25 MG CR tablet, Take 1 tablet (6.25 mg total) by mouth at bedtime as needed for sleep., Disp: 30 tablet, Rfl: 2 .  Icosapent Ethyl (VASCEPA) 1 g CAPS, Take 2 capsules by mouth 2 (two) times daily after a meal., Disp: 360 capsule, Rfl: 0 .  metroNIDAZOLE (FLAGYL) 250 MG tablet, Take 1 tablet (250 mg total) by mouth 4 (four) times daily. (Patient not taking:  Reported on 09/21/2017), Disp: 56 tablet, Rfl: 0 .  oxyCODONE-acetaminophen (PERCOCET/ROXICET) 5-325 MG tablet, Take 1-2 tablets by mouth every 6 (six) hours as needed for severe pain. (Patient not taking: Reported on 09/21/2017), Disp: 6 tablet, Rfl: 0  Allergies  Allergen Reactions  . Naproxen Sodium Rash and Other (See Comments)    800 mg tablets that the MD prescribed for pain.     Review of Systems  Eyes: Negative for blurred vision.  Respiratory: Negative for shortness of breath.   Cardiovascular: Negative for chest pain, palpitations and orthopnea.  Musculoskeletal: Negative for myalgias.  Neurological: Negative for headaches.  Psychiatric/Behavioral: Positive for sleep disturbance. The patient has insomnia.      Objective  Vitals:   09/21/17 1319  BP: 130/76  Pulse: 90  Resp: 18  Temp: (!) 97.5 F (36.4 C)  TempSrc: Oral  SpO2: 95%  Weight: 253 lb 14.4 oz (115.2 kg)  Height: 6' (1.829 m)    Physical Exam  Constitutional: He is oriented to person, place, and time and well-developed, well-nourished, and in no distress.  HENT:  Head: Normocephalic and atraumatic.  Cardiovascular: Normal rate, regular rhythm and normal heart sounds.   No murmur heard. Pulmonary/Chest: Effort normal and breath sounds normal. He has no wheezes.  Abdominal: Soft. Bowel sounds are normal. There is no tenderness.  Musculoskeletal: He exhibits tenderness. He exhibits no edema.  Neurological: He is alert and oriented to person, place, and time.  Psychiatric: Mood, memory, affect and judgment normal.  Nursing note and vitals reviewed.       Assessment & Plan  1. Essential hypertension BP stable on present anti- hypertensive treatment - amLODipine-benazepril (LOTREL) 5-20 MG capsule; TAKE 1 CAPSULE BY MOUTH  EVERY DAY  Dispense: 90 capsule; Refill: 1  2. Insomnia, unspecified type Responsive to Ambien taken every night, refills provided - zolpidem (AMBIEN CR) 6.25 MG CR tablet;  Take 1 tablet (6.25 mg total) by mouth at bedtime as needed for sleep.  Dispense: 90 tablet; Refill: 0  3. Mixed hyperlipidemia On no treatment for elevated cholesterol and triglycerides, recheck FLP and consider starting on higher intensity statin - Lipid panel - COMPLETE METABOLIC PANEL WITH GFR   Natalye Kott Asad A. [facility] 09/21/2017 1:27 PM

## 2017-10-23 ENCOUNTER — Other Ambulatory Visit: Payer: Self-pay | Admitting: Family Medicine

## 2017-10-23 DIAGNOSIS — E782 Mixed hyperlipidemia: Secondary | ICD-10-CM

## 2017-12-21 ENCOUNTER — Ambulatory Visit: Payer: 59 | Admitting: Family Medicine

## 2017-12-21 ENCOUNTER — Encounter: Payer: Self-pay | Admitting: Family Medicine

## 2017-12-21 VITALS — BP 108/62 | HR 88 | Temp 98.0°F | Resp 16 | Ht 72.0 in | Wt 260.0 lb

## 2017-12-21 DIAGNOSIS — E782 Mixed hyperlipidemia: Secondary | ICD-10-CM | POA: Diagnosis not present

## 2017-12-21 DIAGNOSIS — I10 Essential (primary) hypertension: Secondary | ICD-10-CM | POA: Diagnosis not present

## 2017-12-21 DIAGNOSIS — R0982 Postnasal drip: Secondary | ICD-10-CM | POA: Diagnosis not present

## 2017-12-21 DIAGNOSIS — G4700 Insomnia, unspecified: Secondary | ICD-10-CM

## 2017-12-21 MED ORDER — SIMVASTATIN 40 MG PO TABS: 40.0000 mg | ORAL_TABLET | Freq: Every day | ORAL | 0 refills | Status: DC

## 2017-12-21 MED ORDER — AMOXICILLIN-POT CLAVULANATE 875-125 MG PO TABS: 1.0000 | ORAL_TABLET | Freq: Two times a day (BID) | ORAL | 0 refills | Status: DC

## 2017-12-21 MED ORDER — ZOLPIDEM TARTRATE ER 6.25 MG PO TBCR: 6.2500 mg | EXTENDED_RELEASE_TABLET | Freq: Every evening | ORAL | 0 refills | Status: DC | PRN

## 2017-12-21 MED ORDER — MOMETASONE FUROATE 50 MCG/ACT NA SUSP: 2.0000 | Freq: Every day | NASAL | 0 refills | Status: DC

## 2017-12-21 MED ORDER — AMLODIPINE BESY-BENAZEPRIL HCL 5-20 MG PO CAPS: ORAL_CAPSULE | ORAL | 1 refills | Status: DC

## 2017-12-21 NOTE — Progress Notes (Signed)
Name: Carl Burke   MRN: [id number]    DOB: 23-Jul-1956   Date:12/21/2017       Progress Note  Subjective  Chief Complaint  Chief Complaint  Patient presents with  . Medication Refill    3 month F/U  . Hypertension    Denies any symptoms  . Hyperlipidemia  . Insomnia  . Cough    3 month, productive cough-yellow, sinus pressure, nasal drainage    Hypertension  This is a chronic problem. The problem is unchanged. The problem is controlled. Associated symptoms include headaches. Pertinent negatives include no blurred vision, chest pain, palpitations, PND or shortness of breath. Past treatments include calcium channel blockers and ACE inhibitors. There is no history of kidney disease, CAD/MI or CVA.  Hyperlipidemia  This is a chronic problem. The problem is uncontrolled. Recent lipid tests were reviewed and are high. Pertinent negatives include no chest pain, leg pain, myalgias or shortness of breath. Current antihyperlipidemic treatment includes statins.  Insomnia  Primary symptoms: sleep disturbance, premature morning awakening.  The onset quality is gradual. The problem is unchanged. Past treatments include medication. Typical bedtime:  10-11 P.M..  How long after going to bed to you fall asleep: 15-30 minutes.    Cough  This is a recurrent problem. The current episode started more than 1 month ago (3-4 months ago). The problem has been unchanged. The cough is productive of sputum. Associated symptoms include headaches. Pertinent negatives include no chest pain, chills, ear congestion, myalgias, nasal congestion or shortness of breath.     Past Medical History:  Diagnosis Date  . Acute blood loss anemia 07/17/2016  . Acute duodenal ulcer with bleeding 07/17/2016  . Arthritis   . Helicobacter pylori gastritis   . Hyperlipidemia   . Hypertension   . Lumbosacral radiculopathy at S1 (Left) 04/12/2016  . Opiate use 04/12/2016    Past Surgical History:  Procedure Laterality Date  .  ESOPHAGOGASTRODUODENOSCOPY N/A 07/19/2016   Procedure: ESOPHAGOGASTRODUODENOSCOPY (EGD);  Surgeon: Gatha Mayer, MD;  Location[facility] ENDOSCOPY;  Service: Endoscopy;  Laterality: N/A;  . ROTATOR CUFF REPAIR Left 2011  . ROTATOR CUFF REPAIR Right 2013    Family History  Problem Relation Age of Onset  . Heart disease Mother   . Cancer Sister   . Cancer Sister     Social History   Socioeconomic History  . Marital status: Married    Spouse name: Not on file  . Number of children: Not on file  . Years of education: Not on file  . Highest education level: Not on file  Social Needs  . Financial resource strain: Not on file  . Food insecurity - worry: Not on file  . Food insecurity - inability: Not on file  . Transportation needs - medical: Not on file  . Transportation needs - non-medical: Not on file  Occupational History  . Not on file  Tobacco Use  . Smoking status: Current Some Day Smoker    Packs/day: 0.20    Types: Cigarettes  . Smokeless tobacco: Never Used  . Tobacco comment: 1 cigarette each month  Substance and Sexual Activity  . Alcohol use: Yes    Alcohol/week: 0.0 oz    Comment: Rare   . Drug use: No  . Sexual activity: Not on file  Other Topics Concern  . Not on file  Social History Narrative  . Not on file     Current Outpatient Medications:  .  amLODipine-benazepril (LOTREL) 5-20 MG capsule, TAKE  1 CAPSULE BY MOUTH  EVERY DAY, Disp: 90 capsule, Rfl: 1 .  Icosapent Ethyl (VASCEPA) 1 g CAPS, Take 2 capsules by mouth 2 (two) times daily., Disp: , Rfl:  .  Mag Oxide-Vit D3-Turmeric [phone] MG-UNIT-MG CAPS, Take 1 capsule by mouth daily., Disp: , Rfl:  .  simvastatin (ZOCOR) 40 MG tablet, Take 40 mg by mouth daily., Disp: , Rfl:  .  tiZANidine (ZANAFLEX) 4 MG tablet, TAKE 1 TABLET BY MOUTH 3  TIMES DAILY AS NEEDED FOR  MUSCLE SPASM(S), Disp: 270 tablet, Rfl: 0 .  zolpidem (AMBIEN CR) 6.25 MG CR tablet, Take 1 tablet (6.25 mg total) by mouth at bedtime as  needed for sleep., Disp: 90 tablet, Rfl: 0  Allergies  Allergen Reactions  . Naproxen Sodium Rash and Other (See Comments)    800 mg tablets that the MD prescribed for pain.     Review of Systems  Constitutional: Negative for chills.  Eyes: Negative for blurred vision.  Respiratory: Positive for cough. Negative for shortness of breath.   Cardiovascular: Negative for chest pain, palpitations and PND.  Musculoskeletal: Negative for myalgias.  Neurological: Positive for headaches.  Psychiatric/Behavioral: Positive for sleep disturbance. The patient has insomnia.      Objective  Vitals:   12/21/17 1320  BP: 108/62  Pulse: 88  Resp: 16  Temp: 98 F (36.7 C)  TempSrc: Oral  SpO2: 96%  Weight: 260 lb (117.9 kg)  Height: 6' (1.829 m)    Physical Exam  Constitutional: He is oriented to person, place, and time and well-developed, well-nourished, and in no distress.  HENT:  Head: Normocephalic and atraumatic.  Mouth/Throat: Posterior oropharyngeal erythema present.  Left ear canal with whitish colored debris Nasal mucosal inflammation, turbinate hypertrophy.   Cardiovascular: Normal rate, regular rhythm, S1 normal, S2 normal and normal heart sounds.  No murmur heard. Pulmonary/Chest: Effort normal and breath sounds normal. He has no wheezes.  Neurological: He is alert and oriented to person, place, and time.  Nursing note and vitals reviewed.       Assessment & Plan  1. Essential hypertension BP stable on present anti-hypertensive treatment - amLODipine-benazepril (LOTREL) 5-20 MG capsule; TAKE 1 CAPSULE BY MOUTH  EVERY DAY  Dispense: 90 capsule; Refill: 1  2. Insomnia, unspecified type  - zolpidem (AMBIEN CR) 6.25 MG CR tablet; Take 1 tablet (6.25 mg total) by mouth at bedtime as needed for sleep.  Dispense: 90 tablet; Refill: 0  3. Mixed hyperlipidemia  Obtain FLP, adjust statin as appropriate - simvastatin (ZOCOR) 40 MG tablet; Take 1 tablet (40 mg total) by  mouth daily.  Dispense: 90 tablet; Refill: 0 - Lipid panel  4. Post-nasal drainage  - mometasone (NASONEX) 50 MCG/ACT nasal spray; Place 2 sprays into the nose daily.  Dispense: 17 g; Refill: 0 - amoxicillin-clavulanate (AUGMENTIN) 875-125 MG tablet; Take 1 tablet by mouth 2 (two) times daily.  Dispense: 20 tablet; Refill: 0   Ladavia Lindenbaum [facility] 12/21/2017 1:39 PM

## 2018-03-05 ENCOUNTER — Other Ambulatory Visit: Payer: Self-pay

## 2018-03-05 DIAGNOSIS — E782 Mixed hyperlipidemia: Secondary | ICD-10-CM

## 2018-03-05 MED ORDER — SIMVASTATIN 40 MG PO TABS: 40.0000 mg | ORAL_TABLET | Freq: Every day | ORAL | 0 refills | Status: DC

## 2018-03-05 NOTE — Telephone Encounter (Signed)
Refill Request for Cholesterol medication. Simvastatin to [facility].   Last visit: 12/21/2017   Lab Results  Component Value Date   CHOL 209 (H) 01/18/2017   HDL 37 (L) 01/18/2017   LDLCALC 127 (H) 01/18/2017   TRIG 227 (H) 01/18/2017   CHOLHDL 5.6 (H) 01/18/2017    No follow-ups on file. ( Cassandra will call to schedule follow up as due in April).

## 2018-03-09 ENCOUNTER — Other Ambulatory Visit: Payer: Self-pay

## 2018-03-09 DIAGNOSIS — E785 Hyperlipidemia, unspecified: Secondary | ICD-10-CM

## 2018-03-09 DIAGNOSIS — I10 Essential (primary) hypertension: Secondary | ICD-10-CM

## 2018-03-10 LAB — COMPREHENSIVE METABOLIC PANEL
A/G RATIO: 1.8 (ref 1.2–2.2)
ALBUMIN: 4.2 g/dL (ref 3.5–5.5)
ALT: 43 IU/L (ref 0–44)
AST: 28 IU/L (ref 0–40)
Alkaline Phosphatase: 64 IU/L (ref 39–117)
BUN / CREAT RATIO: 18 (ref 9–20)
BUN: 11 mg/dL (ref 6–24)
CALCIUM: 9.4 mg/dL (ref 8.7–10.2)
CHLORIDE: 101 mmol/L (ref 96–106)
CO2: 23 mmol/L (ref 20–29)
Creatinine, Ser: 0.62 mg/dL — ABNORMAL LOW (ref 0.76–1.27)
GFR, EST AFRICAN AMERICAN: 126 mL/min/{1.73_m2} (ref >59)
GFR, EST NON AFRICAN AMERICAN: 109 mL/min/{1.73_m2} (ref >59)
GLOBULIN, TOTAL: 2.3 g/dL (ref 1.5–4.5)
Glucose: 108 mg/dL — ABNORMAL HIGH (ref 65–99)
POTASSIUM: 4.7 mmol/L (ref 3.5–5.2)
Sodium: 141 mmol/L (ref 134–144)
TOTAL PROTEIN: 6.5 g/dL (ref 6.0–8.5)

## 2018-03-10 LAB — LIPID PANEL
CHOLESTEROL TOTAL: 198 mg/dL (ref 100–199)
Chol/HDL Ratio: 5.4 ratio — ABNORMAL HIGH (ref 0.0–5.0)
HDL: 37 mg/dL — AB (ref >39)
LDL Calculated: 109 mg/dL — ABNORMAL HIGH (ref 0–99)
TRIGLYCERIDES: 259 mg/dL — AB (ref 0–149)
VLDL Cholesterol Cal: 52 mg/dL — ABNORMAL HIGH (ref 5–40)

## 2018-03-28 ENCOUNTER — Encounter: Payer: Self-pay | Admitting: Family Medicine

## 2018-03-28 ENCOUNTER — Ambulatory Visit: Payer: Managed Care, Other (non HMO) | Admitting: Family Medicine

## 2018-03-28 VITALS — BP 116/60 | HR 77 | Resp 16 | Ht 72.0 in | Wt 265.8 lb

## 2018-03-28 DIAGNOSIS — M62830 Muscle spasm of back: Secondary | ICD-10-CM

## 2018-03-28 DIAGNOSIS — Z803 Family history of malignant neoplasm of breast: Secondary | ICD-10-CM

## 2018-03-28 DIAGNOSIS — R05 Cough: Secondary | ICD-10-CM

## 2018-03-28 DIAGNOSIS — G4700 Insomnia, unspecified: Secondary | ICD-10-CM | POA: Diagnosis not present

## 2018-03-28 DIAGNOSIS — I10 Essential (primary) hypertension: Secondary | ICD-10-CM | POA: Diagnosis not present

## 2018-03-28 DIAGNOSIS — J302 Other seasonal allergic rhinitis: Secondary | ICD-10-CM

## 2018-03-28 DIAGNOSIS — M62838 Other muscle spasm: Secondary | ICD-10-CM

## 2018-03-28 DIAGNOSIS — M542 Cervicalgia: Secondary | ICD-10-CM | POA: Insufficient documentation

## 2018-03-28 DIAGNOSIS — J3089 Other allergic rhinitis: Secondary | ICD-10-CM

## 2018-03-28 DIAGNOSIS — R059 Cough, unspecified: Secondary | ICD-10-CM

## 2018-03-28 DIAGNOSIS — E782 Mixed hyperlipidemia: Secondary | ICD-10-CM | POA: Diagnosis not present

## 2018-03-28 DIAGNOSIS — G894 Chronic pain syndrome: Secondary | ICD-10-CM

## 2018-03-28 DIAGNOSIS — M5137 Other intervertebral disc degeneration, lumbosacral region: Secondary | ICD-10-CM | POA: Insufficient documentation

## 2018-03-28 DIAGNOSIS — M75120 Complete rotator cuff tear or rupture of unspecified shoulder, not specified as traumatic: Secondary | ICD-10-CM | POA: Insufficient documentation

## 2018-03-28 MED ORDER — ATORVASTATIN CALCIUM 40 MG PO TABS: 40.0000 mg | ORAL_TABLET | Freq: Every day | ORAL | 3 refills | Status: DC

## 2018-03-28 MED ORDER — ZOLPIDEM TARTRATE ER 6.25 MG PO TBCR: 6.2500 mg | EXTENDED_RELEASE_TABLET | Freq: Every evening | ORAL | 0 refills | Status: DC | PRN

## 2018-03-28 MED ORDER — AMLODIPINE BESY-BENAZEPRIL HCL 5-20 MG PO CAPS: ORAL_CAPSULE | ORAL | 1 refills | Status: DC

## 2018-03-28 MED ORDER — ICOSAPENT ETHYL 1 G PO CAPS: 2.0000 | ORAL_CAPSULE | Freq: Two times a day (BID) | ORAL | 1 refills | Status: DC

## 2018-03-28 MED ORDER — LORATADINE 10 MG PO TABS: 10.0000 mg | ORAL_TABLET | Freq: Every day | ORAL | 1 refills | Status: DC

## 2018-03-28 MED ORDER — TIZANIDINE HCL 4 MG PO TABS: ORAL_TABLET | ORAL | 0 refills | Status: DC

## 2018-03-28 NOTE — Progress Notes (Signed)
Name: Carl Burke   MRN: [id number]    DOB: 04/14/1956   Date:03/28/2018       Progress Note  Subjective  Chief Complaint  Chief Complaint  Patient presents with  . Hyperlipidemia  . Hypertension  . Insomnia    HPI  HTN: he denies chest pain or palpitation, taking medication daily, bp towards low end of normal, but denies dizziness.   Chronic back pain, neck pain, radiculitis: used to take narcotics , seen by pain clinic, but he states medication did not help with pain and he stopped taking everything except for tizanidine , his bottle states 2 every three times daily but they dispense 270 pills, we will check with [facility]  Dyslipidemia: taking simvastatin, reviewed labs, needs to resume vascepa and change to lipitor, no chest pain or palpitation.   Cough/AR: he states in the past told he had asthma, but no documentation on our chart, he has a cough this time of the year, taking benadryl, but advised to change to claritin, we will check spirometry next visit  Obesity: discussed life style modification  Insomnia: chronic, takes Ambien every other night and it helps him fall and stay asleep    Family history of breast cancer: both sisters before age 55, discussed importance of colon cancer screening for him, and he will check with insurance first, and wife   Patient Active Problem List   Diagnosis Date Noted  . Degeneration of lumbosacral intervertebral disc 03/28/2018  . Full thickness rotator cuff tear 03/28/2018  . Neck pain 03/28/2018  . Hyperlipidemia 12/20/2016  . Helicobacter pylori gastritis   . Long term prescription opiate use 04/12/2016  . Opiate use (300 MME/Day) 04/12/2016  . Chronic shoulder pain (Right) 04/12/2016  . Cervical spondylosis 04/12/2016  . Chronic upper back pain (Location of Secondary source of pain) (Bilateral) (midline) 04/12/2016  . Essential hypertension 04/12/2016  . Lumbosacral radiculopathy at S1 (Left) 04/12/2016    Past Surgical  History:  Procedure Laterality Date  . ESOPHAGOGASTRODUODENOSCOPY N/A 07/19/2016   Procedure: ESOPHAGOGASTRODUODENOSCOPY (EGD);  Surgeon: Gatha Mayer, MD;  Location: [facility] ENDOSCOPY;  Service: Endoscopy;  Laterality: N/A;  . ROTATOR CUFF REPAIR Left 2011  . ROTATOR CUFF REPAIR Right 2013    Family History  Problem Relation Age of Onset  . Heart disease Mother   . Cancer Sister   . Cancer Sister     Social History   Socioeconomic History  . Marital status: Married    Spouse name: Not on file  . Number of children: Not on file  . Years of education: Not on file  . Highest education level: Not on file  Occupational History  . Occupation: retired   Scientific laboratory technician  . Financial resource strain: Not on file  . Food insecurity:    Worry: Not on file    Inability: Not on file  . Transportation needs:    Medical: Not on file    Non-medical: Not on file  Tobacco Use  . Smoking status: Current Some Day Smoker    Packs/day: 0.20    Years: 15.00    Pack years: 3.00    Types: Cigarettes  . Smokeless tobacco: Former Systems developer    Types: Chew    Quit date: 01/16/1979  . Tobacco comment: used to smoke one pack daily for 10 years and now prn   Substance and Sexual Activity  . Alcohol use: Yes    Alcohol/week: 0.0 oz    Comment: Rare   . Drug  use: No  . Sexual activity: Yes    Partners: Female  Lifestyle  . Physical activity:    Days per week: Not on file    Minutes per session: Not on file  . Stress: Not on file  Relationships  . Social connections:    Talks on phone: Not on file    Gets together: Not on file    Attends religious service: Not on file    Active member of club or organization: Not on file    Attends meetings of clubs or organizations: Not on file    Relationship status: Not on file  . Intimate partner violence:    Fear of current or ex partner: Not on file    Emotionally abused: Not on file    Physically abused: Not on file    Forced sexual activity: Not on file   Other Topics Concern  . Not on file  Social History Narrative  . Not on file     Current Outpatient Medications:  .  amLODipine-benazepril (LOTREL) 5-20 MG capsule, TAKE 1 CAPSULE BY MOUTH  EVERY DAY, Disp: 90 capsule, Rfl: 1 .  atorvastatin (LIPITOR) 40 MG tablet, Take 1 tablet (40 mg total) by mouth daily., Disp: 90 tablet, Rfl: 3 .  Icosapent Ethyl (VASCEPA) 1 g CAPS, Take 2 capsules (2 g total) by mouth 2 (two) times daily., Disp: 480 capsule, Rfl: 1 .  loratadine (CLARITIN) 10 MG tablet, Take 1 tablet (10 mg total) by mouth daily., Disp: 90 tablet, Rfl: 1 .  tiZANidine (ZANAFLEX) 4 MG tablet, TAKE 1 TABLET BY MOUTH 3  TIMES DAILY AS NEEDED FOR  MUSCLE SPASM(S), Disp: 270 tablet, Rfl: 0 .  zolpidem (AMBIEN CR) 6.25 MG CR tablet, Take 1 tablet (6.25 mg total) by mouth at bedtime as needed for sleep., Disp: 90 tablet, Rfl: 0  Allergies  Allergen Reactions  . Naproxen Sodium Rash and Other (See Comments)    800 mg tablets that the MD prescribed for pain.     ROS  Constitutional: Negative for fever , positive for mild  weight change.  Respiratory: Positive for mild cough ( this time of the year )  But no  shortness of breath.   Cardiovascular: Negative for chest pain or palpitations.  Gastrointestinal: Negative for abdominal pain, no bowel changes.  Musculoskeletal: Positive  for gait problem - from back pain, but no  joint swelling.  Skin: Negative for rash.  Neurological: Negative for dizziness or headache ( nuchal pain) .  No other specific complaints in a complete review of systems (except as listed in HPI above).  Objective  Vitals:   03/28/18 1111  BP: 116/60  Pulse: 77  Resp: 16  SpO2: 97%  Weight: 265 lb 12.8 oz (120.6 kg)  Height: 6' (1.829 m)    Body mass index is 36.05 kg/m.  Physical Exam  Constitutional: Patient appears well-developed and well-nourished. Obese  No distress.  HEENT: head atraumatic, normocephalic, pupils equal and reactive to light,  neck supple, throat within normal limits Cardiovascular: Normal rate, regular rhythm and normal heart sounds.  No murmur heard. No BLE edema. Pulmonary/Chest: Effort normal and breath sounds normal. No respiratory distress. Abdominal: Soft.  There is no tenderness. Psychiatric: Patient has a normal mood and affect. behavior is normal. Judgment and thought content normal. Muscular Skeletal: pain during palpation of lumbar spine, decrease rom of lumbar spine, moving around to stay comfortable, positive straight leg raise  Recent Results (from the past 2160 hour(s))  Lipid Profile     Status: Abnormal   Collection Time: 03/09/18  8:47 AM  Result Value Ref Range   Cholesterol, Total 198 100 - 199 mg/dL   Triglycerides 259 (H) 0 - 149 mg/dL   HDL 37 (L) >39 mg/dL   VLDL Cholesterol Cal 52 (H) 5 - 40 mg/dL   LDL Calculated 109 (H) 0 - 99 mg/dL   Chol/HDL Ratio 5.4 (H) 0.0 - 5.0 ratio    Comment:                                   T. Chol/HDL Ratio                                             Men  Women                               1/2 Avg.Risk  3.4    3.3                                   Avg.Risk  5.0    4.4                                2X Avg.Risk  9.6    7.1                                3X Avg.Risk 23.4   11.0   Comprehensive Metabolic Panel (CMET)     Status: Abnormal   Collection Time: 03/09/18  8:47 AM  Result Value Ref Range   Glucose 108 (H) 65 - 99 mg/dL   BUN 11 6 - 24 mg/dL   Creatinine, Ser 0.62 (L) 0.76 - 1.27 mg/dL   GFR calc non Af Amer 109 >59 mL/min/1.73   GFR calc Af Amer 126 >59 mL/min/1.73   BUN/Creatinine Ratio 18 9 - 20   Sodium 141 134 - 144 mmol/L   Potassium 4.7 3.5 - 5.2 mmol/L   Chloride 101 96 - 106 mmol/L   CO2 23 20 - 29 mmol/L   Calcium 9.4 8.7 - 10.2 mg/dL   Total Protein 6.5 6.0 - 8.5 g/dL   Albumin 4.2 3.5 - 5.5 g/dL   Globulin, Total 2.3 1.5 - 4.5 g/dL   Albumin/Globulin Ratio 1.8 1.2 - 2.2   Bilirubin Total <0.2 0.0 - 1.2 mg/dL   Alkaline  Phosphatase 64 39 - 117 IU/L   AST 28 0 - 40 IU/L   ALT 43 0 - 44 IU/L     PHQ2/9: Depression screen [facility] 2/9 12/21/2017 06/21/2017 12/20/2016 05/02/2016 04/14/2016  Decreased Interest 0 0 0 0 0  Down, Depressed, Hopeless 0 0 0 0 0  PHQ - 2 Score 0 0 0 0 0     Fall Risk: Fall Risk  03/28/2018 03/28/2018 12/21/2017 06/21/2017 12/20/2016  Falls in the past year? No No No No No     Functional Status Survey: Is the patient deaf or have difficulty hearing?: No Does the patient have difficulty seeing, even when wearing glasses/contacts?:  No Does the patient have difficulty concentrating, remembering, or making decisions?: No Does the patient have difficulty walking or climbing stairs?: No Does the patient have difficulty dressing or bathing?: No Does the patient have difficulty doing errands alone such as visiting a doctor's office or shopping?: No   Assessment & Plan  1. Essential hypertension  - amLODipine-benazepril (LOTREL) 5-20 MG capsule; TAKE 1 CAPSULE BY MOUTH  EVERY DAY  Dispense: 90 capsule; Refill: 1 bp is towards low end of normal, denies dizziness  2. Mixed hyperlipidemia  Needs to resume Vascepa , change from simvastatin to atorvastatin because of drug drug interaction  - Icosapent Ethyl (VASCEPA) 1 g CAPS; Take 2 capsules (2 g total) by mouth 2 (two) times daily.  Dispense: 480 capsule; Refill: 1 - atorvastatin (LIPITOR) 40 MG tablet; Take 1 tablet (40 mg total) by mouth daily.  Dispense: 90 tablet; Refill: 3  3. Back muscle spasm  Chronic   4. Insomnia, unspecified type  Taking it every other night  - zolpidem (AMBIEN CR) 6.25 MG CR tablet; Take 1 tablet (6.25 mg total) by mouth at bedtime as needed for sleep.  Dispense: 90 tablet; Refill: 0  5. Chronic pain syndrome  Used to see pain doctor, but now off on narcotics, states just takes tylenol ( 1500 mg max daily ) and ibuprofen otc ( max of 1600 mg daily )   6. Muscle spasm  He states he is taking tizanidine 8 mg  tid but getting 270 pills per bottle, we will contact [facility]   7. Perennial allergic rhinitis with seasonal variation  - loratadine (CLARITIN) 10 MG tablet; Take 1 tablet (10 mg total) by mouth daily.  Dispense: 90 tablet; Refill: 1  8. Cough  Spirometry next visit, discussed CXR but he wants to hold off for now

## 2018-06-13 ENCOUNTER — Other Ambulatory Visit: Payer: Self-pay | Admitting: Family Medicine

## 2018-06-13 DIAGNOSIS — G4700 Insomnia, unspecified: Secondary | ICD-10-CM

## 2018-06-13 DIAGNOSIS — M62830 Muscle spasm of back: Secondary | ICD-10-CM

## 2018-06-13 NOTE — Telephone Encounter (Signed)
Refill request for general medication: Zanaflex 4 mg  Ambien 6.25 mg  Last office visit: 03/28/2018  Last physical exam: 04/13/2016  Follow-ups on file. 06/27/2018

## 2018-06-18 ENCOUNTER — Other Ambulatory Visit: Payer: Self-pay

## 2018-06-18 DIAGNOSIS — M62830 Muscle spasm of back: Secondary | ICD-10-CM

## 2018-06-18 NOTE — Telephone Encounter (Signed)
Refill request was sent to Dr. Steele Sizer for approval and submission.  Tricor was discontinued by Dr. Manuella Ghazi on 12/20/2016 (change in therapy) so I am not sure of the reason why [facility] is requesting a refill of this medication

## 2018-06-18 NOTE — Telephone Encounter (Signed)
Patient needs appointment in order to obtain further refills.  

## 2018-06-27 ENCOUNTER — Ambulatory Visit: Payer: Managed Care, Other (non HMO) | Admitting: Family Medicine

## 2018-07-03 ENCOUNTER — Ambulatory Visit: Payer: Managed Care, Other (non HMO) | Admitting: Family Medicine

## 2018-07-03 ENCOUNTER — Encounter: Payer: Self-pay | Admitting: Family Medicine

## 2018-07-03 VITALS — BP 142/82 | HR 83 | Temp 98.4°F | Resp 18 | Ht 72.0 in | Wt 272.7 lb

## 2018-07-03 DIAGNOSIS — R739 Hyperglycemia, unspecified: Secondary | ICD-10-CM | POA: Diagnosis not present

## 2018-07-03 DIAGNOSIS — I10 Essential (primary) hypertension: Secondary | ICD-10-CM | POA: Diagnosis not present

## 2018-07-03 DIAGNOSIS — G894 Chronic pain syndrome: Secondary | ICD-10-CM | POA: Diagnosis not present

## 2018-07-03 DIAGNOSIS — Z8719 Personal history of other diseases of the digestive system: Secondary | ICD-10-CM | POA: Diagnosis not present

## 2018-07-03 DIAGNOSIS — M5417 Radiculopathy, lumbosacral region: Secondary | ICD-10-CM | POA: Diagnosis not present

## 2018-07-03 DIAGNOSIS — G4700 Insomnia, unspecified: Secondary | ICD-10-CM

## 2018-07-03 DIAGNOSIS — Z114 Encounter for screening for human immunodeficiency virus [HIV]: Secondary | ICD-10-CM | POA: Diagnosis not present

## 2018-07-03 DIAGNOSIS — E782 Mixed hyperlipidemia: Secondary | ICD-10-CM

## 2018-07-03 DIAGNOSIS — M62830 Muscle spasm of back: Secondary | ICD-10-CM | POA: Diagnosis not present

## 2018-07-03 DIAGNOSIS — Z1211 Encounter for screening for malignant neoplasm of colon: Secondary | ICD-10-CM | POA: Diagnosis not present

## 2018-07-03 DIAGNOSIS — J302 Other seasonal allergic rhinitis: Secondary | ICD-10-CM | POA: Diagnosis not present

## 2018-07-03 DIAGNOSIS — J3089 Other allergic rhinitis: Secondary | ICD-10-CM | POA: Diagnosis not present

## 2018-07-03 MED ORDER — ZOLPIDEM TARTRATE ER 6.25 MG PO TBCR: 6.2500 mg | EXTENDED_RELEASE_TABLET | Freq: Every evening | ORAL | 0 refills | Status: DC | PRN

## 2018-07-03 MED ORDER — ICOSAPENT ETHYL 1 G PO CAPS: 2.0000 | ORAL_CAPSULE | Freq: Two times a day (BID) | ORAL | 1 refills | Status: DC

## 2018-07-03 MED ORDER — TIZANIDINE HCL 4 MG PO TABS: ORAL_TABLET | ORAL | 0 refills | Status: DC

## 2018-07-03 NOTE — Progress Notes (Signed)
Name: Carl Burke   MRN: [id number]    DOB: 1956-12-12   Date:07/03/2018       Progress Note  Subjective  Chief Complaint  Chief Complaint  Patient presents with  . Medication Refill    3 month F/U  . Hypertension  . Back Pain  . Dyslipidemia  . Insomnia  . Cough/AR    HPI   HTN: he denies chest pain or palpitation, taking medication daily, bp is a little high today, it may be secondary to pain. We will monitor for now  Hyperglycemia: he denies polyphagia, polydipsia or polyuria.   Chronic back pain, neck pain, radiculitis: used to take narcotics , seen by pain clinic, but he states medications did not help, only on Zanaflex three times a day, pain a little worse today because of damp weather. Pain usually 3-4/10 but today up to 5-6/10  Dyslipidemia: taking Atorvastatin and Vascepa , recheck labs. He denies side effects, but Vascepa is too expensive we will give him a voucher today   Cough/AR: he states in the past told he had asthma, but no documentation on our chart, he has a cough this time of the year,he is taking loratadine. Advised to get spirometry but he wants to hold off , he states post-nasal drainage   Obesity: discussed life style modification, his weight has gone up.   Insomnia: chronic, takes Ambien every other night and it helps him fall and stay asleep. Unchanged and needs a refill     Patient Active Problem List   Diagnosis Date Noted  . Degeneration of lumbosacral intervertebral disc 03/28/2018  . Full thickness rotator cuff tear 03/28/2018  . Neck pain 03/28/2018  . Hyperlipidemia 12/20/2016  . Helicobacter pylori gastritis   . Long term prescription opiate use 04/12/2016  . Opiate use (300 MME/Day) 04/12/2016  . Chronic shoulder pain (Right) 04/12/2016  . Cervical spondylosis 04/12/2016  . Chronic upper back pain (Location of Secondary source of pain) (Bilateral) (midline) 04/12/2016  . Essential hypertension 04/12/2016  . Lumbosacral  radiculopathy at S1 (Left) 04/12/2016    Past Surgical History:  Procedure Laterality Date  . ESOPHAGOGASTRODUODENOSCOPY N/A 07/19/2016   Procedure: ESOPHAGOGASTRODUODENOSCOPY (EGD);  Surgeon: Gatha Mayer, MD;  Location: [facility] ENDOSCOPY;  Service: Endoscopy;  Laterality: N/A;  . ROTATOR CUFF REPAIR Left 2011  . ROTATOR CUFF REPAIR Right 2013    Family History  Problem Relation Age of Onset  . Heart disease Mother   . Cancer Sister   . Cancer Sister     Social History   Socioeconomic History  . Marital status: Married    Spouse name: Not on file  . Number of children: Not on file  . Years of education: Not on file  . Highest education level: Not on file  Occupational History  . Occupation: retired   Scientific laboratory technician  . Financial resource strain: Not on file  . Food insecurity:    Worry: Not on file    Inability: Not on file  . Transportation needs:    Medical: Not on file    Non-medical: Not on file  Tobacco Use  . Smoking status: Current Some Day Smoker    Packs/day: 0.20    Years: 15.00    Pack years: 3.00    Types: Cigarettes  . Smokeless tobacco: Former Systems developer    Types: Chew    Quit date: 01/16/1979  . Tobacco comment: used to smoke one pack daily for 10 years and now prn   Substance  and Sexual Activity  . Alcohol use: Yes    Alcohol/week: 0.0 oz    Comment: Rare   . Drug use: No  . Sexual activity: Yes    Partners: Female  Lifestyle  . Physical activity:    Days per week: Not on file    Minutes per session: Not on file  . Stress: Not on file  Relationships  . Social connections:    Talks on phone: Not on file    Gets together: Not on file    Attends religious service: Not on file    Active member of club or organization: Not on file    Attends meetings of clubs or organizations: Not on file    Relationship status: Not on file  . Intimate partner violence:    Fear of current or ex partner: Not on file    Emotionally abused: Not on file    Physically  abused: Not on file    Forced sexual activity: Not on file  Other Topics Concern  . Not on file  Social History Narrative  . Not on file     Current Outpatient Medications:  .  amLODipine-benazepril (LOTREL) 5-20 MG capsule, TAKE 1 CAPSULE BY MOUTH  EVERY DAY, Disp: 90 capsule, Rfl: 1 .  atorvastatin (LIPITOR) 40 MG tablet, Take 1 tablet (40 mg total) by mouth daily., Disp: 90 tablet, Rfl: 3 .  chlorhexidine (PERIDEX) 0.12 % solution, RINSE WITH 1/2 OZ FOR 30 SECONDS AFTER THOROUGH BRUSHING 3 TIMES DAILY, Disp: , Rfl: 99 .  ibuprofen (ADVIL,MOTRIN) 600 MG tablet, TAEK 1 TABLET BY MOUTH 4 TIMES DAILY ALTERNATING WITH TYLENOL AS DIRECTED, Disp: , Rfl: 0 .  Icosapent Ethyl (VASCEPA) 1 g CAPS, Take 2 capsules (2 g total) by mouth 2 (two) times daily., Disp: 480 capsule, Rfl: 1 .  loratadine (CLARITIN) 10 MG tablet, Take 1 tablet (10 mg total) by mouth daily., Disp: 90 tablet, Rfl: 1 .  tiZANidine (ZANAFLEX) 4 MG tablet, TAKE 1 TABLET BY MOUTH 3  TIMES DAILY AS NEEDED FOR  MUSCLE SPASM(S), Disp: 270 tablet, Rfl: 0 .  zolpidem (AMBIEN CR) 6.25 MG CR tablet, Take 1 tablet (6.25 mg total) by mouth at bedtime as needed for sleep., Disp: 90 tablet, Rfl: 0  Allergies  Allergen Reactions  . Naproxen Sodium Rash and Other (See Comments)    800 mg tablets that the MD prescribed for pain.     ROS  Constitutional: Negative for fever or weight change.  Respiratory: Positive  for cough but no  shortness of breath.   Cardiovascular: Negative for chest pain or palpitations.  Gastrointestinal: Negative for abdominal pain, no bowel changes.  Musculoskeletal: Negative for gait problem or joint swelling.  Skin: Negative for rash.  Neurological: Negative for dizziness or headache.  No other specific complaints in a complete review of systems (except as listed in HPI above).  Objective  Vitals:   07/03/18 1554  BP: (!) 142/82  Pulse: 83  Resp: 18  Temp: 98.4 F (36.9 C)  TempSrc: Oral  SpO2: 96%   Weight: 272 lb 11.2 oz (123.7 kg)  Height: 6' (1.829 m)    Body mass index is 36.98 kg/m.  Physical Exam  Constitutional: Patient appears well-developed and well-nourished. Obese No distress.  HEENT: head atraumatic, normocephalic, pupils equal and reactive to light,  neck supple, throat within normal limits, deviated septum  Cardiovascular: Normal rate, regular rhythm and normal heart sounds.  No murmur heard. No BLE edema. Pulmonary/Chest: Effort  normal and breath sounds normal. No respiratory distress. Abdominal: Soft.  There is no tenderness. Psychiatric: Patient has a normal mood and affect. behavior is normal. Judgment and thought content normal. Muscular Skeletal: pain during palpation of left lower back    PHQ2/9: Depression screen [facility] 2/9 07/03/2018 12/21/2017 06/21/2017 12/20/2016 05/02/2016  Decreased Interest 0 0 0 0 0  Down, Depressed, Hopeless 0 0 0 0 0  PHQ - 2 Score 0 0 0 0 0     Fall Risk: Fall Risk  07/03/2018 03/28/2018 03/28/2018 12/21/2017 06/21/2017  Falls in the past year? No No No No No     Functional Status Survey: Is the patient deaf or have difficulty hearing?: No Does the patient have difficulty seeing, even when wearing glasses/contacts?: Yes(glasses) Does the patient have difficulty concentrating, remembering, or making decisions?: No Does the patient have difficulty walking or climbing stairs?: Yes(back pain) Does the patient have difficulty dressing or bathing?: No Does the patient have difficulty doing errands alone such as visiting a doctor's office or shopping?: No    Assessment & Plan  1. Essential hypertension  Usually at goal, but pain level is higher today  - COMPLETE METABOLIC PANEL WITH GFR  2. Mixed hyperlipidemia  - Icosapent Ethyl (VASCEPA) 1 g CAPS; Take 2 capsules (2 g total) by mouth 2 (two) times daily.  Dispense: 480 capsule; Refill: 1 - Lipid panel  3. Back muscle spasm  - tiZANidine (ZANAFLEX) 4 MG tablet; TAKE 1 TABLET  BY MOUTH 3  TIMES DAILY AS NEEDED FOR  MUSCLE SPASM(S)  Dispense: 270 tablet; Refill: 0  4. Chronic pain syndrome  A little flare today because of change in weather   5. Perennial allergic rhinitis with seasonal variation  He will resume loratadine for symptoms of post-nasal drainage, he refuses nasal spray at this time  6. Lumbosacral radiculopathy at S1 (Left)  A little flare because of weather change  7. Colon cancer screening  - Ambulatory referral to Gastroenterology  8. History of duodenal ulcer  - Ambulatory referral to Gastroenterology  9. Insomnia, unspecified type  - zolpidem (AMBIEN CR) 6.25 MG CR tablet; Take 1 tablet (6.25 mg total) by mouth at bedtime as needed for sleep.  Dispense: 90 tablet; Refill: 0  10. Encounter for screening for HIV  - HIV antibody  11. Hyperglycemia  - Hemoglobin A1c

## 2018-07-04 ENCOUNTER — Telehealth: Payer: Self-pay | Admitting: Gastroenterology

## 2018-07-04 NOTE — Telephone Encounter (Signed)
Patient Carl Burke to call and schedule a colonoscopy

## 2018-07-05 ENCOUNTER — Telehealth: Payer: Self-pay

## 2018-07-05 NOTE — Telephone Encounter (Signed)
LVM on patients home number to contact office to schedule colonoscopy.  Thanks [employer]

## 2018-07-06 ENCOUNTER — Telehealth: Payer: Self-pay | Admitting: Gastroenterology

## 2018-07-06 NOTE — Telephone Encounter (Signed)
Patient would like a call back today to schedule a colonoscopy.

## 2018-07-19 LAB — CMP14+EGFR
ALK PHOS: 59 IU/L (ref 39–117)
ALT: 39 IU/L (ref 0–44)
AST: 27 IU/L (ref 0–40)
Albumin/Globulin Ratio: 2 (ref 1.2–2.2)
Albumin: 4.2 g/dL (ref 3.6–4.8)
BUN/Creatinine Ratio: 14 (ref 10–24)
BUN: 10 mg/dL (ref 8–27)
Bilirubin Total: 0.4 mg/dL (ref 0.0–1.2)
CO2: 21 mmol/L (ref 20–29)
CREATININE: 0.69 mg/dL — AB (ref 0.76–1.27)
Calcium: 9.1 mg/dL (ref 8.6–10.2)
Chloride: 102 mmol/L (ref 96–106)
GFR calc Af Amer: 119 mL/min/{1.73_m2} (ref >59)
GFR, EST NON AFRICAN AMERICAN: 103 mL/min/{1.73_m2} (ref >59)
GLOBULIN, TOTAL: 2.1 g/dL (ref 1.5–4.5)
GLUCOSE: 103 mg/dL — AB (ref 65–99)
Potassium: 4.5 mmol/L (ref 3.5–5.2)
SODIUM: 141 mmol/L (ref 134–144)
Total Protein: 6.3 g/dL (ref 6.0–8.5)

## 2018-07-19 LAB — LIPID PANEL
CHOLESTEROL TOTAL: 174 mg/dL (ref 100–199)
Chol/HDL Ratio: 4.7 ratio (ref 0.0–5.0)
HDL: 37 mg/dL — AB (ref >39)
LDL Calculated: 93 mg/dL (ref 0–99)
Triglycerides: 222 mg/dL — ABNORMAL HIGH (ref 0–149)
VLDL CHOLESTEROL CAL: 44 mg/dL — AB (ref 5–40)

## 2018-07-19 LAB — HEMOGLOBIN A1C
ESTIMATED AVERAGE GLUCOSE: 131 mg/dL
Hgb A1c MFr Bld: 6.2 % — ABNORMAL HIGH (ref 4.8–5.6)

## 2018-07-19 LAB — HIV ANTIBODY (ROUTINE TESTING W REFLEX): HIV SCREEN 4TH GENERATION: NONREACTIVE

## 2018-07-23 ENCOUNTER — Telehealth: Payer: Self-pay

## 2018-07-23 NOTE — Telephone Encounter (Signed)
Please try to call him back

## 2018-07-23 NOTE — Telephone Encounter (Signed)
Pt returned call and lab message given to him with verbal understanding. He is requesting a call back from Dr. Ancil Boozer regarding his pre diabetes and needing to exercise.

## 2018-07-23 NOTE — Telephone Encounter (Signed)
Patient called.  Unable to reach patient. If patient calls back please inform her of her most recent lab results.  

## 2018-07-23 NOTE — Telephone Encounter (Signed)
-----   Message from Steele Sizer, MD sent at 07/20/2018  9:54 PM EDT ----- HIV negative Glucose is slightly elevated and also elevated hgbA1C ( pre-diabetes) needs to follow a diabetic diet He has  normal kidney and liver function test Lipid panel is much better, triglycerides has improved, LDL is at goal. HDL is still elevated ( he needs to exercise more , eat more fish and tree nuts)  Continue medications. Working well for him

## 2018-07-25 NOTE — Telephone Encounter (Signed)
Left a message to notify him we will mail him some information about Pre-Diabetes Diet Plans.

## 2018-07-30 ENCOUNTER — Ambulatory Visit: Payer: Managed Care, Other (non HMO) | Admitting: Family Medicine

## 2018-08-15 ENCOUNTER — Ambulatory Visit (INDEPENDENT_AMBULATORY_CARE_PROVIDER_SITE_OTHER): Payer: 59 | Admitting: Gastroenterology

## 2018-08-15 ENCOUNTER — Other Ambulatory Visit: Payer: Self-pay

## 2018-08-15 ENCOUNTER — Encounter: Payer: Self-pay | Admitting: Gastroenterology

## 2018-08-15 VITALS — BP 160/90 | Ht 72.0 in | Wt 269.6 lb

## 2018-08-15 DIAGNOSIS — A048 Other specified bacterial intestinal infections: Secondary | ICD-10-CM | POA: Diagnosis not present

## 2018-08-15 DIAGNOSIS — Z1211 Encounter for screening for malignant neoplasm of colon: Secondary | ICD-10-CM

## 2018-08-15 DIAGNOSIS — K269 Duodenal ulcer, unspecified as acute or chronic, without hemorrhage or perforation: Secondary | ICD-10-CM

## 2018-08-15 DIAGNOSIS — B9689 Other specified bacterial agents as the cause of diseases classified elsewhere: Secondary | ICD-10-CM

## 2018-08-15 MED ORDER — PANTOPRAZOLE SODIUM 40 MG PO TBEC: 40.0000 mg | DELAYED_RELEASE_TABLET | Freq: Every day | ORAL | 2 refills | Status: DC

## 2018-08-15 NOTE — Progress Notes (Signed)
Carl Darby, MD [address]  [place]  [place], [place] [address]  Main: [phone]  Fax: [phone]    Gastroenterology Consultation  Referring Provider:     Steele Sizer, MD Primary Care Physician:  Steele Sizer, MD Primary Gastroenterologist:  Dr. Cephas Burke Reason for Consultation:     History of peptic ulcer disease, needs screening colonoscopy        HPI:   Chandon Lazcano is a 62 y.o. adult referred by Dr. Steele Sizer, MD  for consultation & management of history of peptic ulcer disease. Patient underwent EGD in 07/2016 secondary to melena which resulted in anemia and was found to have duodenal ulcers, H. Pylori-positive, treated with bismuth quadruple therapy. His anemia resolved since then. Unclear, if H. Pylori eradication was confirmed posttreatment. He is also past due for colon cancer screening. 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 He is off opioids present. He manages pain by taking Tylenol and ibuprofen daily. He denies any GI symptoms  NSAIDs: ibuprofen 600 mg 3 times daily with food  Antiplts/Anticoagulants/Anti thrombotics:  none  GI Procedures:  EGD 07/19/16 - Multiple non-obstructing non-bleeding duodenal ulcers with pigmented material. Suspicious for NSAID induced etiology. There is no evidence of perforation. - Gastritis. Biopsied. - The examination was otherwise normal. Duodenum, Biopsy, bulb ulcer HELICOBACTER PYLORI GASTRITIS GASTRIC ANTRIUM MUCOSA WITH INTESTINAL METAPLASIA NEGATIVE FOR ATYPIA OR MALIGNANCY Helicobacter pylori organisms are identified (Warthin-Starr stain).  Father had colon cancer at age 33  Past Medical History:  Diagnosis Date  . Acute blood loss anemia 07/17/2016  . Acute duodenal ulcer with bleeding 07/17/2016  . Arthritis   . Helicobacter pylori gastritis   . Hyperlipidemia   . Hypertension   . Lumbosacral radiculopathy at S1 (Left) 04/12/2016  . Opiate use 04/12/2016     Past Surgical History:  Procedure Laterality Date  . ESOPHAGOGASTRODUODENOSCOPY N/A 07/19/2016   Procedure: ESOPHAGOGASTRODUODENOSCOPY (EGD);  Surgeon: Gatha Mayer, MD;  [facility] ENDOSCOPY;  Service: Endoscopy;  Laterality: N/A;  . ROTATOR CUFF REPAIR Left 2011  . ROTATOR CUFF REPAIR Right 2013    Current Outpatient Medications:  .  amLODipine-benazepril (LOTREL) 5-20 MG capsule, TAKE 1 CAPSULE BY MOUTH  EVERY DAY, Disp: 90 capsule, Rfl: 1 .  atorvastatin (LIPITOR) 40 MG tablet, Take 1 tablet (40 mg total) by mouth daily., Disp: 90 tablet, Rfl: 3 .  chlorhexidine (PERIDEX) 0.12 % solution, RINSE WITH 1/2 OZ FOR 30 SECONDS AFTER THOROUGH BRUSHING 3 TIMES DAILY, Disp: , Rfl: 99 .  ibuprofen (ADVIL,MOTRIN) 600 MG tablet, TAEK 1 TABLET BY MOUTH 4 TIMES DAILY ALTERNATING WITH TYLENOL AS DIRECTED, Disp: , Rfl: 0 .  Icosapent Ethyl (VASCEPA) 1 g CAPS, Take 2 capsules (2 g total) by mouth 2 (two) times daily., Disp: 480 capsule, Rfl: 1 .  loratadine (CLARITIN) 10 MG tablet, Take 1 tablet (10 mg total) by mouth daily., Disp: 90 tablet, Rfl: 1 .  tiZANidine (ZANAFLEX) 4 MG tablet, TAKE 1 TABLET BY MOUTH 3  TIMES DAILY AS NEEDED FOR  MUSCLE SPASM(S), Disp: 270 tablet, Rfl: 0 .  zolpidem (AMBIEN CR) 6.25 MG CR tablet, Take 1 tablet (6.25 mg total) by mouth at bedtime as needed for sleep., Disp: 90 tablet, Rfl: 0 .  simvastatin (ZOCOR) 40 MG tablet, simvastatin 40 mg tablet, Disp: , Rfl:    Family History  Problem Relation Age of Onset  . Heart disease Mother   . Cancer Sister   .  Cancer Sister      Social History   Tobacco Use  . Smoking status: Current Some Day Smoker    Packs/day: 0.20    Years: 15.00    Pack years: 3.00    Types: Cigarettes  . Smokeless tobacco: Former Systems developer    Types: Chew    Quit date: 01/16/1979  . Tobacco comment: used to smoke one pack daily for 10 years and now prn   Substance Use Topics  . Alcohol use: Yes    Alcohol/week: 0.0 standard drinks     Comment: Rare   . Drug use: No    Allergies as of 08/15/2018 - Review Complete 08/15/2018  Allergen Reaction Noted  . Naproxen sodium Rash and Other (See Comments) 04/12/2016    Review of Systems:    All systems reviewed and negative except where noted in HPI.   Physical Exam:  BP (!) 160/90   Ht 6' (1.829 m)   Wt 269 lb 9.6 oz (122.3 kg)   BMI 36.56 kg/m  No LMP recorded.  General:   Alert,  Well-developed, well-nourished, pleasant and cooperative in NAD Head:  Normocephalic and atraumatic. Eyes:  Sclera clear, no icterus.   Conjunctiva pink. Ears:  Normal auditory acuity. Nose:  No deformity, discharge, or lesions. Mouth:  No deformity or lesions,oropharynx pink & moist. Neck:  Supple; no masses or thyromegaly. Lungs:  Respirations even and unlabored.  Clear throughout to auscultation.   No wheezes, crackles, or rhonchi. No acute distress. Heart:  Regular rate and rhythm; no murmurs, clicks, rubs, or gallops. Abdomen:  Normal bowel sounds. Soft, obese, non-tender and non-distended without masses, hepatosplenomegaly or hernias noted.  No guarding or rebound tenderness.   Rectal: Not performed Msk:  Symmetrical without gross deformities. Good, equal movement & strength bilaterally. Pulses:  Normal pulses noted. Extremities:  No clubbing or edema.  No cyanosis. Neurologic:  Alert and oriented x3;  grossly normal neurologically. Skin:  Intact without significant lesions or rashes. No jaundice. Lymph Nodes:  No significant cervical adenopathy. Psych:  Alert and cooperative. Normal mood and affect.  Imaging Studies: No abdominal imaging  Assessment and Plan:   Demarrius Guerrero is a 62 y.o. adult with history of peptic ulcer disease secondary to H. Pylori infection status post treatment with quadruple therapy and long-term NSAID use for chronic back pain  History of peptic ulcer disease and H. Pylori infection Recommend EGD to confirm eradication Recommend to start proton  pump inhibitor long-term as patient is on long-term NSAID use for chronic back pain  Colon cancer screening Recommend colonoscopy  I have discussed alternative options, risks & benefits,  which include, but are not limited to, bleeding, infection, perforation,respiratory complication & drug reaction.  The patient agrees with this plan & written consent will be obtained.     Follow up in 3 months   Carl Darby, MD

## 2018-08-17 ENCOUNTER — Other Ambulatory Visit: Payer: Self-pay | Admitting: Family Medicine

## 2018-08-17 DIAGNOSIS — I10 Essential (primary) hypertension: Secondary | ICD-10-CM

## 2018-08-22 ENCOUNTER — Encounter: Payer: Self-pay | Admitting: *Deleted

## 2018-08-23 ENCOUNTER — Ambulatory Visit: Payer: Managed Care, Other (non HMO) | Admitting: Anesthesiology

## 2018-08-23 ENCOUNTER — Ambulatory Visit
Admission: RE | Admit: 2018-08-23 | Discharge: 2018-08-23 | Disposition: A | Discharge status: Home / Self Care | Payer: Managed Care, Other (non HMO) | Source: Ambulatory Visit | Attending: Gastroenterology | Admitting: Gastroenterology

## 2018-08-23 ENCOUNTER — Encounter
Admission: RE | Disposition: A | Discharge status: Home / Self Care | Payer: Self-pay | Source: Ambulatory Visit | Attending: Gastroenterology

## 2018-08-23 ENCOUNTER — Encounter: Payer: Self-pay | Admitting: *Deleted

## 2018-08-23 DIAGNOSIS — D128 Benign neoplasm of rectum: Secondary | ICD-10-CM | POA: Diagnosis not present

## 2018-08-23 DIAGNOSIS — K621 Rectal polyp: Secondary | ICD-10-CM | POA: Diagnosis not present

## 2018-08-23 DIAGNOSIS — K648 Other hemorrhoids: Secondary | ICD-10-CM | POA: Diagnosis not present

## 2018-08-23 DIAGNOSIS — I6930 Unspecified sequelae of cerebral infarction: Secondary | ICD-10-CM | POA: Diagnosis not present

## 2018-08-23 DIAGNOSIS — D120 Benign neoplasm of cecum: Secondary | ICD-10-CM

## 2018-08-23 DIAGNOSIS — Z79899 Other long term (current) drug therapy: Secondary | ICD-10-CM | POA: Insufficient documentation

## 2018-08-23 DIAGNOSIS — I10 Essential (primary) hypertension: Secondary | ICD-10-CM | POA: Diagnosis not present

## 2018-08-23 DIAGNOSIS — D123 Benign neoplasm of transverse colon: Secondary | ICD-10-CM

## 2018-08-23 DIAGNOSIS — F17210 Nicotine dependence, cigarettes, uncomplicated: Secondary | ICD-10-CM | POA: Diagnosis not present

## 2018-08-23 DIAGNOSIS — K3189 Other diseases of stomach and duodenum: Secondary | ICD-10-CM | POA: Diagnosis not present

## 2018-08-23 DIAGNOSIS — E785 Hyperlipidemia, unspecified: Secondary | ICD-10-CM | POA: Insufficient documentation

## 2018-08-23 DIAGNOSIS — Z888 Allergy status to other drugs, medicaments and biological substances status: Secondary | ICD-10-CM | POA: Insufficient documentation

## 2018-08-23 DIAGNOSIS — K259 Gastric ulcer, unspecified as acute or chronic, without hemorrhage or perforation: Secondary | ICD-10-CM

## 2018-08-23 DIAGNOSIS — M1990 Unspecified osteoarthritis, unspecified site: Secondary | ICD-10-CM | POA: Insufficient documentation

## 2018-08-23 DIAGNOSIS — Z8711 Personal history of peptic ulcer disease: Secondary | ICD-10-CM | POA: Insufficient documentation

## 2018-08-23 DIAGNOSIS — Z1211 Encounter for screening for malignant neoplasm of colon: Secondary | ICD-10-CM | POA: Diagnosis present

## 2018-08-23 DIAGNOSIS — K269 Duodenal ulcer, unspecified as acute or chronic, without hemorrhage or perforation: Secondary | ICD-10-CM | POA: Diagnosis not present

## 2018-08-23 DIAGNOSIS — B9689 Other specified bacterial agents as the cause of diseases classified elsewhere: Secondary | ICD-10-CM

## 2018-08-23 DIAGNOSIS — A048 Other specified bacterial intestinal infections: Secondary | ICD-10-CM | POA: Diagnosis not present

## 2018-08-23 DIAGNOSIS — Z8249 Family history of ischemic heart disease and other diseases of the circulatory system: Secondary | ICD-10-CM | POA: Insufficient documentation

## 2018-08-23 HISTORY — PX: ESOPHAGOGASTRODUODENOSCOPY (EGD) WITH PROPOFOL: SHX5813

## 2018-08-23 HISTORY — PX: COLONOSCOPY WITH PROPOFOL: SHX5780

## 2018-08-23 SURGERY — COLONOSCOPY WITH PROPOFOL
Anesthesia: General

## 2018-08-23 MED ORDER — PROPOFOL 500 MG/50ML IV EMUL
INTRAVENOUS | Status: AC
  Filled 2018-08-23: qty 50

## 2018-08-23 MED ORDER — PROPOFOL 500 MG/50ML IV EMUL
INTRAVENOUS | Status: DC | PRN
  Administered 2018-08-23: 140 ug/kg/min via INTRAVENOUS

## 2018-08-23 MED ORDER — SODIUM CHLORIDE 0.9 % IV SOLN
INTRAVENOUS | Status: DC
  Administered 2018-08-23: 13:00:00 via INTRAVENOUS

## 2018-08-23 MED ORDER — LIDOCAINE HCL (CARDIAC) PF 100 MG/5ML IV SOSY
PREFILLED_SYRINGE | INTRAVENOUS | Status: DC | PRN
  Administered 2018-08-23: 50 mg via INTRAVENOUS

## 2018-08-23 MED ORDER — LIDOCAINE HCL (PF) 2 % IJ SOLN
INTRAMUSCULAR | Status: AC
  Filled 2018-08-23: qty 10

## 2018-08-23 MED ORDER — PROPOFOL 10 MG/ML IV BOLUS
INTRAVENOUS | Status: DC | PRN
  Administered 2018-08-23: 100 mg via INTRAVENOUS

## 2018-08-23 MED ORDER — PROPOFOL 10 MG/ML IV BOLUS
INTRAVENOUS | Status: AC
  Filled 2018-08-23: qty 40

## 2018-08-23 NOTE — Anesthesia Preprocedure Evaluation (Signed)
Anesthesia Evaluation  Patient identified by MRN, date of birth, ID band Patient awake    Reviewed: Allergy & Precautions, H&P , NPO status , Patient's Chart, lab work & pertinent test results, reviewed documented beta blocker date and time   History of Anesthesia Complications (+) history of anesthetic complications (Aggressive waking up)  Airway Mallampati: III  TM Distance: >3 FB Neck ROM: full    Dental  (+) Upper Dentures, Missing, Poor Dentition, Dental Advidsory Given   Pulmonary neg shortness of breath, neg sleep apnea, neg COPD, neg recent URI, Current Smoker,           Cardiovascular Exercise Tolerance: Good hypertension, (-) angina(-) CAD, (-) Past MI, (-) Cardiac Stents and (-) CABG (-) dysrhythmias (-) Valvular Problems/Murmurs     Neuro/Psych neg Seizures CVA, Residual Symptoms negative psych ROS   GI/Hepatic Neg liver ROS, PUD,   Endo/Other  negative endocrine ROS  Renal/GU negative Renal ROS  negative genitourinary   Musculoskeletal   Abdominal   Peds  Hematology negative hematology ROS (+)   Anesthesia Other Findings Past Medical History: 07/17/2016: Acute blood loss anemia 07/17/2016: Acute duodenal ulcer with bleeding No date: Arthritis No date: Helicobacter pylori gastritis No date: Hyperlipidemia No date: Hypertension 04/12/2016: Lumbosacral radiculopathy at S1 (Left) 04/12/2016: Opiate use   Reproductive/Obstetrics negative OB ROS                             Anesthesia Physical Anesthesia Plan  ASA: II  Anesthesia Plan: General   Post-op Pain Management:    Induction: Intravenous  PONV Risk Score and Plan: 1 and Propofol infusion and TIVA  Airway Management Planned: Nasal Cannula and Natural Airway  Additional Equipment:   Intra-op Plan:   Post-operative Plan:   Informed Consent: I have reviewed the patients History and Physical, chart, labs and discussed  the procedure including the risks, benefits and alternatives for the proposed anesthesia with the patient or authorized representative who has indicated his/her understanding and acceptance.   Dental Advisory Given  Plan Discussed with: Anesthesiologist, CRNA and Surgeon  Anesthesia Plan Comments:         Anesthesia Quick Evaluation

## 2018-08-23 NOTE — H&P (Signed)
Cephas Darby, MD [address]  [place]  [place], [place] [address]  Main: [phone]  Fax: [phone] Pager: [phone]  Primary Care Physician:  Steele Sizer, MD Primary Gastroenterologist:  Dr. Cephas Darby  Pre-Procedure History & Physical: HPI:  Carl Burke is a 62 y.o. adult is here for an endoscopy and colonoscopy.   Past Medical History:  Diagnosis Date  . Acute blood loss anemia 07/17/2016  . Acute duodenal ulcer with bleeding 07/17/2016  . Arthritis   . Helicobacter pylori gastritis   . Hyperlipidemia   . Hypertension   . Lumbosacral radiculopathy at S1 (Left) 04/12/2016  . Opiate use 04/12/2016    Past Surgical History:  Procedure Laterality Date  . COLONOSCOPY    . ESOPHAGOGASTRODUODENOSCOPY N/A 07/19/2016   Procedure: ESOPHAGOGASTRODUODENOSCOPY (EGD);  Surgeon: Gatha Mayer, MD;  Location: [facility] ENDOSCOPY;  Service: Endoscopy;  Laterality: N/A;  . ROTATOR CUFF REPAIR Left 2011  . ROTATOR CUFF REPAIR Right 2013    Prior to Admission medications   Medication Sig Start Date End Date Taking? Authorizing Provider  amLODipine-benazepril (LOTREL) 5-20 MG capsule TAKE 1 CAPSULE BY MOUTH  EVERY DAY 08/19/18  Yes Sowles, Drue Stager, MD  atorvastatin (LIPITOR) 40 MG tablet Take 1 tablet (40 mg total) by mouth daily. 03/28/18  Yes Sowles, Drue Stager, MD  chlorhexidine (PERIDEX) 0.12 % solution RINSE WITH 1/2 OZ FOR 30 SECONDS AFTER THOROUGH BRUSHING 3 TIMES DAILY 06/08/18  Yes [provider]  ibuprofen (ADVIL,MOTRIN) 600 MG tablet TAEK 1 TABLET BY MOUTH 4 TIMES DAILY ALTERNATING WITH TYLENOL AS DIRECTED 06/08/18  Yes [provider]  Icosapent Ethyl (VASCEPA) 1 g CAPS Take 2 capsules (2 g total) by mouth 2 (two) times daily. 07/03/18  Yes Sowles, Drue Stager, MD  loratadine (CLARITIN) 10 MG tablet Take 1 tablet (10 mg total) by mouth daily. 03/28/18  Yes Sowles, Drue Stager, MD  simvastatin (ZOCOR) 40 MG tablet simvastatin 40 mg tablet   Yes [provider]  tiZANidine (ZANAFLEX) 4 MG tablet TAKE 1 TABLET BY MOUTH 3  TIMES DAILY AS NEEDED FOR  MUSCLE SPASM(S) 07/03/18  Yes Sowles, Drue Stager, MD  zolpidem (AMBIEN CR) 6.25 MG CR tablet Take 1 tablet (6.25 mg total) by mouth at bedtime as needed for sleep. 07/03/18 10/01/18 Yes Sowles, Drue Stager, MD  pantoprazole (PROTONIX) 40 MG tablet Take 1 tablet (40 mg total) by mouth daily before breakfast. Patient not taking: Reported on 08/23/2018 08/15/18 11/13/18  Lin Landsman, MD    Allergies as of 08/15/2018 - Review Complete 08/15/2018  Allergen Reaction Noted  . Naproxen sodium Rash and Other (See Comments) 04/12/2016    Family History  Problem Relation Age of Onset  . Heart disease Mother   . Cancer Sister   . Cancer Sister     Social History   Socioeconomic History  . Marital status: Married    Spouse name: Not on file  . Number of children: Not on file  . Years of education: Not on file  . Highest education level: Not on file  Occupational History  . Occupation: retired   Scientific laboratory technician  . Financial resource strain: Not on file  . Food insecurity:    Worry: Not on file    Inability: Not on file  . Transportation needs:    Medical: Not on file    Non-medical: Not on file  Tobacco Use  . Smoking status: Current Some Day Smoker    Packs/day: 0.20    Years: 15.00  Pack years: 3.00    Types: Cigarettes  . Smokeless tobacco: Former Systems developer    Types: [place] date: 01/16/1974  . Tobacco comment: used to smoke one pack daily for 10 years and now prn   Substance and Sexual Activity  . Alcohol use: Yes    Alcohol/week: 0.0 standard drinks    Comment: Rare/occassional   . Drug use: No  . Sexual activity: Yes    Partners: Female  Lifestyle  . Physical activity:    Days per week: Not on file    Minutes per session: Not on file  . Stress: Not on file  Relationships  . Social connections:    Talks on phone: Not on file    Gets together: Not on file    Attends  religious service: Not on file    Active member of club or organization: Not on file    Attends meetings of clubs or organizations: Not on file    Relationship status: Not on file  . Intimate partner violence:    Fear of current or ex partner: Not on file    Emotionally abused: Not on file    Physically abused: Not on file    Forced sexual activity: Not on file  Other Topics Concern  . Not on file  Social History Narrative  . Not on file    Review of Systems: See HPI, otherwise negative ROS  Physical Exam: BP (!) 155/105   Temp (!) 97.3 F (36.3 C) (Tympanic)   Resp 20   Ht 6' (1.829 m)   Wt 118.8 kg   SpO2 100%   BMI 35.53 kg/m  General:   Alert,  pleasant and cooperative in NAD Head:  Normocephalic and atraumatic. Neck:  Supple; no masses or thyromegaly. Lungs:  Clear throughout to auscultation.    Heart:  Regular rate and rhythm. Abdomen:  Soft, nontender and nondistended. Normal bowel sounds, without guarding, and without rebound.   Neurologic:  Alert and  oriented x4;  grossly normal neurologically.  Impression/Plan: Ona Rathert is here for an endoscopy and colonoscopy to be performed for h/o PUD, H Pylori, colon cancer screening  Risks, benefits, limitations, and alternatives regarding  endoscopy and colonoscopy have been reviewed with the patient.  Questions have been answered.  All parties agreeable.   Sherri Sear, MD  08/23/2018, 1:18 PM

## 2018-08-23 NOTE — Op Note (Signed)
[facility] Gastroenterology Patient Name: Carl Burke Procedure Date: 08/23/2018 2:06 PM MRN: [id number] Account #: [id number] Date of Birth: [birth year] Admit Type: Outpatient Age: 62 Room: [facility] [place] ENDO ROOM 2 Gender: Male Note Status: Finalized Procedure:            Upper GI endoscopy Indications:          Helicobacter pylori, Follow-up of Helicobacter pylori,                        Follow-up of gastric ulcer with obstruction Providers:            Lin Landsman MD, MD Medicines:            Monitored Anesthesia Care Complications:        No immediate complications. Estimated blood loss:                        Minimal. Procedure:            Pre-Anesthesia Assessment:                       - Prior to the procedure, a History and Physical was                        performed, and patient medications and allergies were                        reviewed. The patient is competent. The risks and                        benefits of the procedure and the sedation options and                        risks were discussed with the patient. All questions                        were answered and informed consent was obtained.                        Patient identification and proposed procedure were                        verified by the physician, the nurse, the                        anesthesiologist, the anesthetist and the technician in                        the pre-procedure area in the procedure room in the                        endoscopy suite. Mental Status Examination: alert and                        oriented. Airway Examination: normal oropharyngeal                        airway and neck mobility. Respiratory Examination:                        clear  to auscultation. CV Examination: normal. ASA                        Grade Assessment: II - A patient with mild systemic                        disease. After reviewing the risks and benefits, the     patient was deemed in satisfactory condition to undergo                        the procedure. The anesthesia plan was to use monitored                        anesthesia care (MAC). Immediately prior to                        administration of medications, the patient was                        re-assessed for adequacy to receive sedatives. The                        heart rate, respiratory rate, oxygen saturations, blood                        pressure, adequacy of pulmonary ventilation, and                        response to care were monitored throughout the                        procedure. The physical status of the patient was                        re-assessed after the procedure.                       After obtaining informed consent, the endoscope was                        passed under direct vision. Throughout the procedure,                        the patient's blood pressure, pulse, and oxygen                        saturations were monitored continuously. The Endoscope                        was introduced through the mouth, and advanced to the                        second part of duodenum. The upper GI endoscopy was                        accomplished without difficulty. The patient tolerated                        the procedure fairly well. Findings:      The duodenal bulb was normal.  A single localized erosion without bleeding was found in the second       portion of the duodenum.      Diffuse moderately erythematous mucosa without bleeding was found in the       entire examined stomach. Biopsies were taken with a cold forceps for       Helicobacter pylori testing.      A few dispersed, diminutive non-bleeding erosions were found in the       gastric fundus. There were no stigmata of recent bleeding.      The gastroesophageal junction and examined esophagus were normal.      Esophagogastric landmarks were identified: the gastroesophageal junction       was found at  40 cm from the incisors. Impression:           - Normal duodenal bulb.                       - Duodenal erosion without bleeding.                       - Erythematous mucosa in the stomach. Biopsied.                       - Non-bleeding erosive gastropathy.                       - Normal gastroesophageal junction and esophagus.                       - Esophagogastric landmarks identified. Recommendation:       - Await pathology results.                       - Use a proton pump inhibitor PO BID long term.                       - Proceed with colonoscopy as scheduled                       See colonoscopy report Procedure Code(s):    --- Professional ---                       [phone], Esophagogastroduodenoscopy, flexible, transoral;                        with biopsy, single or multiple Diagnosis Code(s):    --- Professional ---                       K26.9, Duodenal ulcer, unspecified as acute or chronic,                        without hemorrhage or perforation                       K31.89, Other diseases of stomach and duodenum                       K56.25, Helicobacter pylori [H. pylori] as the cause of                        diseases classified elsewhere  K25.9, Gastric ulcer, unspecified as acute or chronic,                        without hemorrhage or perforation CPT copyright 2017 American Medical Association. All rights reserved. The codes documented in this report are preliminary and upon coder review may  be revised to meet current compliance requirements. Dr. Ulyess Mort Lin Landsman MD, MD 08/23/2018 2:22:44 PM This report has been signed electronically. Number of Addenda: 0 Note Initiated On: 08/23/2018 2:06 PM      [facility]

## 2018-08-23 NOTE — OR Nursing (Signed)
IV d/c'ed from left hand - 22 gauge angiocath.  Site without redness or swelling.  Catheter intact.

## 2018-08-23 NOTE — Transfer of Care (Signed)
Immediate Anesthesia Transfer of Care Note  Patient: Ashwath Lasch  Procedure(s) Performed: COLONOSCOPY WITH PROPOFOL (N/A ) ESOPHAGOGASTRODUODENOSCOPY (EGD) WITH PROPOFOL (N/A )  Patient Location: PACU and Endoscopy Unit  Anesthesia Type:General  Level of Consciousness: awake, alert , oriented and patient cooperative  Airway & Oxygen Therapy: Patient Spontanous Breathing and Patient connected to nasal cannula oxygen  Post-op Assessment: Report given to RN and Post -op Vital signs reviewed and stable  Post vital signs: Reviewed and stable  Last Vitals:  Vitals Value Taken Time  BP 151/82 08/23/2018  3:00 PM  Temp    Pulse 91 08/23/2018  3:00 PM  Resp 27 08/23/2018  3:00 PM  SpO2 95 % 08/23/2018  3:00 PM  Vitals shown include unvalidated device data.  Last Pain:  Vitals:   08/23/18 1455  TempSrc: (P) Tympanic         Complications: No apparent anesthesia complications

## 2018-08-23 NOTE — Op Note (Signed)
[facility] Gastroenterology Patient Name: Carl Burke Procedure Date: 08/23/2018 2:07 PM MRN: [id number] Account #: [id number] Date of Birth: 1956-04-02 Admit Type: Outpatient Age: 62 Room: [facility] ENDO ROOM 2 Gender: Male Note Status: Finalized Procedure:            Colonoscopy Indications:          Screening for colorectal malignant neoplasm, Last                        colonoscopy: June 2011 Providers:            Lin Landsman MD, MD Medicines:            Monitored Anesthesia Care Complications:        No immediate complications. Estimated blood loss: None. Procedure:            Pre-Anesthesia Assessment:                       - Prior to the procedure, a History and Physical was                        performed, and patient medications and allergies were                        reviewed. The patient is competent. The risks and                        benefits of the procedure and the sedation options and                        risks were discussed with the patient. All questions                        were answered and informed consent was obtained.                        Patient identification and proposed procedure were                        verified by the physician, the nurse, the                        anesthesiologist, the anesthetist and the technician in                        the pre-procedure area in the procedure room in the                        endoscopy suite. Mental Status Examination: alert and                        oriented. Airway Examination: normal oropharyngeal                        airway and neck mobility. Respiratory Examination:                        clear to auscultation. CV Examination: normal.  Prophylactic Antibiotics: The patient does not require                        prophylactic antibiotics. Prior Anticoagulants: The                        patient has taken no previous anticoagulant or              antiplatelet agents. ASA Grade Assessment: II - A                        patient with mild systemic disease. After reviewing the                        risks and benefits, the patient was deemed in                        satisfactory condition to undergo the procedure. The                        anesthesia plan was to use monitored anesthesia care                        (MAC). Immediately prior to administration of                        medications, the patient was re-assessed for adequacy                        to receive sedatives. The heart rate, respiratory rate,                        oxygen saturations, blood pressure, adequacy of                        pulmonary ventilation, and response to care were                        monitored throughout the procedure. The physical status                        of the patient was re-assessed after the procedure.                       After obtaining informed consent, the colonoscope was                        passed under direct vision. Throughout the procedure,                        the patient's blood pressure, pulse, and oxygen                        saturations were monitored continuously. The                        Colonoscope was introduced through the anus and                        advanced to the the cecum, identified by appendiceal  orifice and ileocecal valve. The colonoscopy was                        performed without difficulty. The patient tolerated the                        procedure well. The quality of the bowel preparation                        was evaluated using the BBPS [facility] Bowel Preparation                        Scale) with scores of: Right Colon = 2 (minor amount of                        residual staining, small fragments of stool and/or                        opaque liquid, but mucosa seen well), Transverse Colon                        = 3 (entire mucosa seen well with no  residual staining,                        small fragments of stool or opaque liquid) and Left                        Colon = 3 (entire mucosa seen well with no residual                        staining, small fragments of stool or opaque liquid).                        The total BBPS score equals 8. Findings:      The perianal and digital rectal examinations were normal. Pertinent       negatives include normal sphincter tone and no palpable rectal lesions.      Two sessile polyps were found in the transverse colon and cecum. The       polyps were small in size. These polyps were removed with a cold snare.       Resection and retrieval were complete.      Four sessile polyps were found in the rectum. The polyps were 4 to 6 mm       in size. These polyps were removed with a hot snare. Resection and       retrieval were complete.      Non-bleeding internal hemorrhoids were found during retroflexion. The       hemorrhoids were medium-sized.      The exam was otherwise without abnormality. Impression:           - Two small polyps in the transverse colon and in the                        cecum, removed with a cold snare. Resected and                        retrieved.                       -  Four 4 to 6 mm polyps in the rectum, removed with a                        hot snare. Resected and retrieved.                       - Non-bleeding internal hemorrhoids.                       - The examination was otherwise normal. Recommendation:       - Discharge patient to home (with spouse).                       - Resume previous diet today.                       - Continue present medications.                       - Await pathology results.                       - Repeat colonoscopy in 3 - 5 years for surveillance of                        multiple polyps. Procedure Code(s):    --- Professional ---                       [id number], Colonoscopy, flexible; with removal of tumor(s),                         polyp(s), or other lesion(s) by snare technique Diagnosis Code(s):    --- Professional ---                       Z12.11, Encounter for screening for malignant neoplasm                        of colon                       D12.3, Benign neoplasm of transverse colon (hepatic                        flexure or splenic flexure)                       D12.0, Benign neoplasm of cecum                       K62.1, Rectal polyp                       K64.8, Other hemorrhoids CPT copyright 2017 American Medical Association. All rights reserved. The codes documented in this report are preliminary and upon coder review may  be revised to meet current compliance requirements. Dr. Ulyess Mort Lin Landsman MD, MD 08/23/2018 2:51:30 PM This report has been signed electronically. Number of Addenda: 0 Note Initiated On: 08/23/2018 2:07 PM Scope Withdrawal Time: 0 hours 17 minutes 29 seconds  Total Procedure Duration: 0 hours 22 minutes 52 seconds       [facility]

## 2018-08-23 NOTE — Anesthesia Post-op Follow-up Note (Signed)
Anesthesia QCDR form completed.        

## 2018-08-24 NOTE — Anesthesia Postprocedure Evaluation (Signed)
Anesthesia Post Note  Patient: Ashvin Adelson  Procedure(s) Performed: COLONOSCOPY WITH PROPOFOL (N/A ) ESOPHAGOGASTRODUODENOSCOPY (EGD) WITH PROPOFOL (N/A )  Patient location during evaluation: PACU Anesthesia Type: General Level of consciousness: awake and alert Pain management: pain level controlled Vital Signs Assessment: post-procedure vital signs reviewed and stable Respiratory status: spontaneous breathing, nonlabored ventilation and respiratory function stable Cardiovascular status: blood pressure returned to baseline and stable Postop Assessment: no apparent nausea or vomiting Anesthetic complications: no     Last Vitals:  Vitals:   08/23/18 1505 08/23/18 1515  BP: 134/79 137/82  Resp:    Temp:    SpO2:      Last Pain:  Vitals:   08/23/18 1515  TempSrc:   PainSc: 0-No pain                 Durenda Hurt

## 2018-08-27 ENCOUNTER — Encounter: Payer: Self-pay | Admitting: Gastroenterology

## 2018-08-29 LAB — SURGICAL PATHOLOGY

## 2018-08-31 ENCOUNTER — Encounter: Payer: Self-pay | Admitting: Gastroenterology

## 2018-09-04 ENCOUNTER — Other Ambulatory Visit: Payer: Self-pay | Admitting: Family Medicine

## 2018-09-04 DIAGNOSIS — M62830 Muscle spasm of back: Secondary | ICD-10-CM

## 2018-09-04 DIAGNOSIS — J302 Other seasonal allergic rhinitis: Secondary | ICD-10-CM

## 2018-09-04 DIAGNOSIS — G4700 Insomnia, unspecified: Secondary | ICD-10-CM

## 2018-09-04 DIAGNOSIS — J3089 Other allergic rhinitis: Secondary | ICD-10-CM

## 2018-09-04 NOTE — Telephone Encounter (Signed)
I will not send 90 days, his last visit was 3 months ago. He needs follow up, I can send 30 days to local pharmacy until he is seen in october

## 2018-09-04 NOTE — Telephone Encounter (Signed)
Copied from [place] [phone]. Topic: Quick Communication - See Telephone Encounter >> Sep 04, 2018  2:52 PM Vernona Rieger wrote: CRM for notification. See Telephone encounter for: 09/04/18.  tiZANidine (ZANAFLEX) 4 MG tablet zolpidem (AMBIEN CR) 6.25 MG CR tablet loratadine (CLARITIN) 10 MG tablet  [facility]

## 2018-10-03 ENCOUNTER — Ambulatory Visit: Payer: Managed Care, Other (non HMO) | Admitting: Family Medicine

## 2018-11-10 ENCOUNTER — Other Ambulatory Visit: Payer: Self-pay | Admitting: Family Medicine

## 2018-11-10 DIAGNOSIS — I10 Essential (primary) hypertension: Secondary | ICD-10-CM

## 2018-11-14 ENCOUNTER — Encounter: Payer: Self-pay | Admitting: Gastroenterology

## 2018-11-14 ENCOUNTER — Other Ambulatory Visit: Payer: Self-pay

## 2018-11-14 ENCOUNTER — Ambulatory Visit (INDEPENDENT_AMBULATORY_CARE_PROVIDER_SITE_OTHER): Payer: Managed Care, Other (non HMO) | Admitting: Gastroenterology

## 2018-11-14 VITALS — BP 146/93 | HR 106 | Resp 18 | Ht 72.0 in | Wt 279.0 lb

## 2018-11-14 DIAGNOSIS — Z791 Long term (current) use of non-steroidal anti-inflammatories (NSAID): Secondary | ICD-10-CM

## 2018-11-14 DIAGNOSIS — D126 Benign neoplasm of colon, unspecified: Secondary | ICD-10-CM

## 2018-11-14 DIAGNOSIS — Z8619 Personal history of other infectious and parasitic diseases: Secondary | ICD-10-CM

## 2018-11-14 MED ORDER — OMEPRAZOLE 20 MG PO CPDR: 20.0000 mg | DELAYED_RELEASE_CAPSULE | Freq: Two times a day (BID) | ORAL | 1 refills | Status: DC

## 2018-11-14 NOTE — Progress Notes (Signed)
Cephas Darby, MD [address]  [place]  [place], [place] [address]  Main: [phone]  Fax: [phone]    Gastroenterology Consultation  Referring Provider:     Steele Sizer, MD Primary Care Physician:  Steele Sizer, MD Primary Gastroenterologist:  Dr. Cephas Darby Reason for Consultation:     History of peptic ulcer disease and H. pylori infection        HPI:   Carl Burke is a 61 y.o. adult referred by Dr. Steele Sizer, MD  for consultation & management of history of peptic ulcer disease. Patient underwent EGD in 07/2016 secondary to melena which resulted in anemia and was found to have duodenal ulcers, H. Pylori-positive, treated with bismuth quadruple therapy. His anemia resolved since then. Unclear, if H. Pylori eradication was confirmed posttreatment. He is also past due for colon cancer screening. 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 He is off opioids present. He manages pain by taking Tylenol and ibuprofen daily. He denies any GI symptoms  Follow-up visit 11/14/2018 Patient underwent upper endoscopy and there was no evidence of H. Pylori.  He denies any GI symptoms today.  He continues to take ibuprofen for chronic pain.  Patient reported that he could not tolerate Protonix and therefore stopped taking it.  He is currently not on acid suppressive therapy.  NSAIDs: ibuprofen 600 mg 3 times daily with food  Antiplts/Anticoagulants/Anti thrombotics:  none  GI Procedures:  EGD 07/19/16 - Multiple non-obstructing non-bleeding duodenal ulcers with pigmented material. Suspicious for NSAID induced etiology. There is no evidence of perforation. - Gastritis. Biopsied. - The examination was otherwise normal. Duodenum, Biopsy, bulb ulcer HELICOBACTER PYLORI GASTRITIS GASTRIC ANTRIUM MUCOSA WITH INTESTINAL METAPLASIA NEGATIVE FOR ATYPIA OR MALIGNANCY Helicobacter pylori organisms are identified (Warthin-Starr stain).  EGD  08/23/2018 - Normal duodenal bulb. - Duodenal erosion without bleeding. - Erythematous mucosa in the stomach. Biopsied. - Non-bleeding erosive gastropathy.  Colonoscopy 08/23/2018 - Two small polyps in the transverse colon and in the cecum, removed with a cold snare. Resected and retrieved. - Four 4 to 6 mm polyps in the rectum, removed with a hot snare. Resected and retrieved. - Non-bleeding internal hemorrhoids. - The examination was otherwise normal.  DIAGNOSIS:  A. STOMACH; COLD BIOPSY:  - ANTRAL AND OXYNTIC MUCOSA WITH MILD REACTIVE FOVEOLAR HYPERPLASIA AND  CONGESTION.  -NEGATIVE FOR ACTIVE INFLAMMATION, INTESTINAL METAPLASIA, DYSPLASIA, AND  MALIGNANCY.  Negative for Helicobacter pylori on immunohistochemistry  B. COLON POLYP, TRANSVERSE; COLD SNARE:  - TUBULAR ADENOMA.  - NEGATIVE FOR HIGH-GRADE DYSPLASIA AND MALIGNANCY.   C. COLON POLYP, CECUM; COLD SNARE:  - UNREMARKABLE COLONIC MUCOSA.  - NEGATIVE FOR DYSPLASIA AND MALIGNANCY.   D. RECTUM POLYPS; HOT SNARE:  - TUBULAR ADENOMA, ONE 7 MM FRAGMENT, NEGATIVE FOR HIGH-GRADE DYSPLASIA  AND MALIGNANCY.  - HYPERPLASTIC POLYPS, 3 FRAGMENTS, NEGATIVE FOR DYSPLASIA AND  MALIGNANCY.   Father had colon cancer at age 44  Past Medical History:  Diagnosis Date  . Acute blood loss anemia 07/17/2016  . Acute duodenal ulcer with bleeding 07/17/2016  . Arthritis   . Helicobacter pylori gastritis   . Hyperlipidemia   . Hypertension   . Lumbosacral radiculopathy at S1 (Left) 04/12/2016  . Opiate use 04/12/2016    Past Surgical History:  Procedure Laterality Date  . COLONOSCOPY    . COLONOSCOPY WITH PROPOFOL N/A 08/23/2018   Procedure: COLONOSCOPY WITH PROPOFOL;  Surgeon: Lin Landsman, MD;  Location: [facility] ENDOSCOPY;  Service: Gastroenterology;  Laterality: N/A;  . ESOPHAGOGASTRODUODENOSCOPY N/A 07/19/2016   Procedure: ESOPHAGOGASTRODUODENOSCOPY (EGD);  Surgeon: Gatha Mayer, MD;  Location[facility] ENDOSCOPY;  Service: Endoscopy;   Laterality: N/A;  . ESOPHAGOGASTRODUODENOSCOPY (EGD) WITH PROPOFOL N/A 08/23/2018   Procedure: ESOPHAGOGASTRODUODENOSCOPY (EGD) WITH PROPOFOL;  Surgeon: Lin Landsman, MD;  Location: [facility] ENDOSCOPY;  Service: Gastroenterology;  Laterality: N/A;  . ROTATOR CUFF REPAIR Left 2011  . ROTATOR CUFF REPAIR Right 2013    Current Outpatient Medications:  .  amLODipine-benazepril (LOTREL) 5-20 MG capsule, TAKE 1 CAPSULE BY MOUTH  EVERY DAY, Disp: 2 capsule, Rfl: 0 .  atorvastatin (LIPITOR) 40 MG tablet, Take 1 tablet (40 mg total) by mouth daily., Disp: 90 tablet, Rfl: 3 .  chlorhexidine (PERIDEX) 0.12 % solution, RINSE WITH 1/2 OZ FOR 30 SECONDS AFTER THOROUGH BRUSHING 3 TIMES DAILY, Disp: , Rfl: 99 .  hydrOXYzine (ATARAX/VISTARIL) 25 MG tablet, , Disp: , Rfl: 0 .  ibuprofen (ADVIL,MOTRIN) 600 MG tablet, TAEK 1 TABLET BY MOUTH 4 TIMES DAILY ALTERNATING WITH TYLENOL AS DIRECTED, Disp: , Rfl: 0 .  loratadine (CLARITIN) 10 MG tablet, Take 1 tablet (10 mg total) by mouth daily., Disp: 90 tablet, Rfl: 1 .  tiZANidine (ZANAFLEX) 4 MG tablet, TAKE 1 TABLET BY MOUTH 3  TIMES DAILY AS NEEDED FOR  MUSCLE SPASM(S), Disp: 270 tablet, Rfl: 0 .  cefdinir (OMNICEF) 300 MG capsule, , Disp: , Rfl: 0 .  Icosapent Ethyl (VASCEPA) 1 g CAPS, Take 2 capsules (2 g total) by mouth 2 (two) times daily. (Patient not taking: Reported on 11/14/2018), Disp: 480 capsule, Rfl: 1 .  methylPREDNISolone (MEDROL DOSEPAK) 4 MG TBPK tablet, , Disp: , Rfl: 0 .  omeprazole (PRILOSEC) 20 MG capsule, Take 1 capsule (20 mg total) by mouth 2 (two) times daily before a meal., Disp: 180 capsule, Rfl: 1 .  pantoprazole (PROTONIX) 40 MG tablet, Take 1 tablet (40 mg total) by mouth daily before breakfast. (Patient not taking: Reported on 08/23/2018), Disp: 90 tablet, Rfl: 2 .  simvastatin (ZOCOR) 40 MG tablet, simvastatin 40 mg tablet, Disp: , Rfl:  .  zolpidem (AMBIEN CR) 6.25 MG CR tablet, Take 1 tablet (6.25 mg total) by mouth at bedtime as  needed for sleep., Disp: 90 tablet, Rfl: 0   Family History  Problem Relation Age of Onset  . Heart disease Mother   . Cancer Sister   . Cancer Sister      Social History   Tobacco Use  . Smoking status: Current Some Day Smoker    Packs/day: 0.20    Years: 15.00    Pack years: 3.00    Types: Cigarettes  . Smokeless tobacco: Former Systems developer    Types: [place] date: 01/16/1974  . Tobacco comment: used to smoke one pack daily for 10 years and now prn   Substance Use Topics  . Alcohol use: Yes    Alcohol/week: 0.0 standard drinks    Comment: Rare/occassional   . Drug use: No    Allergies as of 11/14/2018 - Review Complete 11/14/2018  Allergen Reaction Noted  . Naproxen sodium Rash and Other (See Comments) 04/12/2016    Review of Systems:    All systems reviewed and negative except where noted in HPI.   Physical Exam:  BP (!) 146/93 (BP Location: Left Arm, Patient Position: Sitting, Cuff Size: Large)   Pulse (!) 106   Resp 18   Ht 6' (1.829 m)   Wt 279 lb (126.6 kg)   BMI  37.84 kg/m  No LMP recorded.  General:   Alert,  Well-developed, well-nourished, pleasant and cooperative in NAD Head:  Normocephalic and atraumatic. Eyes:  Sclera clear, no icterus.   Conjunctiva pink. Ears:  Normal auditory acuity. Nose:  No deformity, discharge, or lesions. Mouth:  No deformity or lesions,oropharynx pink & moist. Neck:  Supple; no masses or thyromegaly. Lungs:  Respirations even and unlabored.  Clear throughout to auscultation.   No wheezes, crackles, or rhonchi. No acute distress. Heart:  Regular rate and rhythm; no murmurs, clicks, rubs, or gallops. Abdomen:  Normal bowel sounds. Soft, obese, non-tender and non-distended without masses, hepatosplenomegaly or hernias noted.  No guarding or rebound tenderness.   Rectal: Not performed Msk:  Symmetrical without gross deformities. Good, equal movement & strength bilaterally. Pulses:  Normal pulses noted. Extremities:  No clubbing  or edema.  No cyanosis. Neurologic:  Alert and oriented x3;  grossly normal neurologically. Skin:  Intact without significant lesions or rashes. No jaundice. Psych:  Alert and cooperative. Normal mood and affect.  Imaging Studies: No abdominal imaging  Assessment and Plan:   Carl Burke is a 62 y.o. adult with history of peptic ulcer disease secondary to H. Pylori infection status post treatment with quadruple therapy and long-term NSAID use for chronic back pain  History of peptic ulcer disease and H. Pylori infection EGD confirmed eradication of H. pylori Switch from Protonix to omeprazole 20 mg twice daily long-term as patient is on long-term NSAID use for chronic back pain  Colon cancer screening: Colonoscopy revealed tubular adenomas without evidence of dysplasia Recommend surveillance colonoscopy in 08/2023   Follow up as needed  Cephas Darby, MD

## 2018-12-21 ENCOUNTER — Other Ambulatory Visit: Payer: Self-pay | Admitting: Family Medicine

## 2018-12-21 DIAGNOSIS — M62830 Muscle spasm of back: Secondary | ICD-10-CM

## 2018-12-21 MED ORDER — TIZANIDINE HCL 4 MG PO TABS: ORAL_TABLET | ORAL | 0 refills | Status: DC

## 2018-12-21 NOTE — Telephone Encounter (Signed)
Pt called back - he would like a 30 day supply called in.

## 2018-12-21 NOTE — Telephone Encounter (Signed)
Copied from [place] [id number]. Topic: Quick Communication - Rx Refill/Question >> Dec 21, 2018 10:48 AM Leward Quan A wrote: Medication: tiZANidine (ZANAFLEX) 4 MG tablet [  Has the patient contacted their pharmacy? Yes.   (Agent: If no, request that the patient contact the pharmacy for the refill.) (Agent: If yes, when and what did the pharmacy advise?)  Preferred Pharmacy (with phone number or street name): [id number], [place] [phone] (Phone) [phone] (Fax)    Agent: Please be advised that RX refills may take up to 3 business days. We ask that you follow-up with your pharmacy.

## 2018-12-21 NOTE — Addendum Note (Signed)
Addended by: Inda Coke on: 12/21/2018 02:34 PM   Modules accepted: Orders

## 2019-01-03 ENCOUNTER — Ambulatory Visit: Payer: Managed Care, Other (non HMO) | Admitting: Family Medicine

## 2019-01-03 ENCOUNTER — Encounter: Payer: Self-pay | Admitting: Family Medicine

## 2019-01-03 VITALS — BP 140/70 | HR 99 | Temp 98.3°F | Resp 16 | Ht 72.0 in | Wt 275.8 lb

## 2019-01-03 DIAGNOSIS — R739 Hyperglycemia, unspecified: Secondary | ICD-10-CM

## 2019-01-03 DIAGNOSIS — M19041 Primary osteoarthritis, right hand: Secondary | ICD-10-CM

## 2019-01-03 DIAGNOSIS — M62830 Muscle spasm of back: Secondary | ICD-10-CM

## 2019-01-03 DIAGNOSIS — Z1159 Encounter for screening for other viral diseases: Secondary | ICD-10-CM | POA: Diagnosis not present

## 2019-01-03 DIAGNOSIS — Z23 Encounter for immunization: Secondary | ICD-10-CM | POA: Diagnosis not present

## 2019-01-03 DIAGNOSIS — E782 Mixed hyperlipidemia: Secondary | ICD-10-CM | POA: Diagnosis not present

## 2019-01-03 DIAGNOSIS — I10 Essential (primary) hypertension: Secondary | ICD-10-CM | POA: Diagnosis not present

## 2019-01-03 DIAGNOSIS — J3089 Other allergic rhinitis: Secondary | ICD-10-CM

## 2019-01-03 DIAGNOSIS — G894 Chronic pain syndrome: Secondary | ICD-10-CM

## 2019-01-03 DIAGNOSIS — M19042 Primary osteoarthritis, left hand: Secondary | ICD-10-CM

## 2019-01-03 DIAGNOSIS — J302 Other seasonal allergic rhinitis: Secondary | ICD-10-CM

## 2019-01-03 DIAGNOSIS — G4700 Insomnia, unspecified: Secondary | ICD-10-CM

## 2019-01-03 MED ORDER — TIZANIDINE HCL 4 MG PO TABS: ORAL_TABLET | ORAL | 1 refills | Status: DC

## 2019-01-03 MED ORDER — LORATADINE 10 MG PO TABS: 10.0000 mg | ORAL_TABLET | Freq: Two times a day (BID) | ORAL | 1 refills | Status: DC

## 2019-01-03 MED ORDER — AMLODIPINE BESY-BENAZEPRIL HCL 5-20 MG PO CAPS: 1.0000 | ORAL_CAPSULE | Freq: Every day | ORAL | 1 refills | Status: DC

## 2019-01-03 MED ORDER — IBUPROFEN 200 MG PO TABS: 200.0000 mg | ORAL_TABLET | Freq: Two times a day (BID) | ORAL | 0 refills | Status: DC

## 2019-01-03 MED ORDER — ZOLPIDEM TARTRATE ER 6.25 MG PO TBCR: 6.2500 mg | EXTENDED_RELEASE_TABLET | Freq: Every evening | ORAL | 0 refills | Status: DC | PRN

## 2019-01-03 MED ORDER — MEGARED OMEGA-3 KRILL OIL 500 MG PO CAPS: 1.0000 | ORAL_CAPSULE | Freq: Two times a day (BID) | ORAL | 0 refills | Status: DC

## 2019-01-03 MED ORDER — ATORVASTATIN CALCIUM 40 MG PO TABS: 40.0000 mg | ORAL_TABLET | Freq: Every day | ORAL | 1 refills | Status: DC

## 2019-01-03 NOTE — Progress Notes (Signed)
Name: Carl Burke   MRN: [id number]    DOB: 1956/02/02   Date:01/03/2019       Progress Note  Subjective  Chief Complaint  Chief Complaint  Patient presents with  . Medication Refill    6 month F/U  . Hypertension    Goes up and down regarding pain level  . Back Pain  . Dyslipidemia  . Insomnia    Gets about 4 hours a night  . Hyperglycemia    HPI  HTN: he denies chest pain or palpitation, taking medication daily, bp is at goal today.   Hyperglycemia: he denies polyphagia, polydipsia or polyuria. last hgbA1C was 6.2% , reminded him of a diabetic diet  Chronic back pain, neck pain, radiculitis/also hands: used to take narcotics , seen by pain clinic, but he states medications did not help, only on Zanaflex three times a day. Pain right now is 4-5/10 and stable.   Dyslipidemia: taking Atorvastatin  But off Vascepa because of cost, taking otc Megared , recheck labs.   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 He gets 90 days but lasts 6 months   Patient Active Problem List   Diagnosis Date Noted  . Degeneration of lumbosacral intervertebral disc 03/28/2018  . Full thickness rotator cuff tear 03/28/2018  . Neck pain 03/28/2018  . Hyperlipidemia 12/20/2016  . Chronic shoulder pain (Right) 04/12/2016  . Cervical spondylosis 04/12/2016  . Chronic upper back pain (Location of Secondary source of pain) (Bilateral) (midline) 04/12/2016  . Essential hypertension 04/12/2016  . Lumbosacral radiculopathy at S1 (Left) 04/12/2016    Past Surgical History:  Procedure Laterality Date  . COLONOSCOPY    . COLONOSCOPY WITH PROPOFOL N/A 08/23/2018   Procedure: COLONOSCOPY WITH PROPOFOL;  Surgeon: Lin Landsman, MD;  Location: [facility] ENDOSCOPY;  Service: Gastroenterology;  Laterality: N/A;  .  ESOPHAGOGASTRODUODENOSCOPY N/A 07/19/2016   Procedure: ESOPHAGOGASTRODUODENOSCOPY (EGD);  Surgeon: Gatha Mayer, MD;  Location: [facility] ENDOSCOPY;  Service: Endoscopy;  Laterality: N/A;  . ESOPHAGOGASTRODUODENOSCOPY (EGD) WITH PROPOFOL N/A 08/23/2018   Procedure: ESOPHAGOGASTRODUODENOSCOPY (EGD) WITH PROPOFOL;  Surgeon: Lin Landsman, MD;  Location: [facility] ENDOSCOPY;  Service: Gastroenterology;  Laterality: N/A;  . ROTATOR CUFF REPAIR Left 2011  . ROTATOR CUFF REPAIR Right 2013    Family History  Problem Relation Age of Onset  . Heart disease Mother   . Cancer Sister   . Cancer Sister     Social History   Socioeconomic History  . Marital status: Married    Spouse name: Blanch Media  . Number of children: 3  . Years of education: Not on file  . Highest education level: Not on file  Occupational History  . Occupation: retired   Scientific laboratory technician  . Financial resource strain: Not hard at all  . Food insecurity:    Worry: Never true    Inability: Never true  . Transportation needs:    Medical: No    Non-medical: No  Tobacco Use  . Smoking status: Current Some Day Smoker    Packs/day: 0.20    Years: 15.00    Pack years: 3.00    Types: Cigarettes  . Smokeless tobacco: Former Systems developer    Types: [place] date: 01/16/1974  . Tobacco comment: used to smoke one pack daily for 10 years and now prn   Substance  and Sexual Activity  . Alcohol use: Yes    Alcohol/week: 0.0 standard drinks    Comment: Rare/occassional   . Drug use: No  . Sexual activity: Yes    Partners: Female  Lifestyle  . Physical activity:    Days per week: 0 days    Minutes per session: 0 min  . Stress: Not on file  Relationships  . Social connections:    Talks on phone: Not on file    Gets together: Not on file    Attends religious service: Not on file    Active member of club or organization: Not on file    Attends meetings of clubs or organizations: Not on file    Relationship status: Not on file  . Intimate  partner violence:    Fear of current or ex partner: No    Emotionally abused: No    Physically abused: No    Forced sexual activity: No  Other Topics Concern  . 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  amLODipine-benazepril (LOTREL) 5-20 MG capsule, Take 1 capsule by mouth daily., Disp: 90 capsule, Rfl: 1 .  atorvastatin (LIPITOR) 40 MG tablet, Take 1 tablet (40 mg total) by mouth daily., Disp: 90 tablet, Rfl: 1 .  loratadine (CLARITIN) 10 MG tablet, Take 1 tablet (10 mg total) by mouth 2 (two) times daily., Disp: 180 tablet, Rfl: 1 .  tiZANidine (ZANAFLEX) 4 MG tablet, TAKE 1 TABLET BY MOUTH 3  TIMES DAILY AS NEEDED FOR  MUSCLE SPASM(S), Disp: 270 tablet, Rfl: 1 .  ibuprofen (ADVIL,MOTRIN) 200 MG tablet, Take 1 tablet (200 mg total) by mouth 2 (two) times daily., Disp: 60 tablet, Rfl: 0 .  MEGARED OMEGA-3 KRILL OIL 500 MG CAPS, Take 1 capsule by mouth 2 (two) times daily., Disp: 60 capsule, Rfl: 0 .  zolpidem (AMBIEN CR) 6.25 MG CR tablet, Take 1 tablet (6.25 mg total) by mouth at bedtime as needed for sleep., Disp: 90 tablet, Rfl: 0  Allergies  Allergen Reactions  . Naproxen Sodium Rash and Other (See Comments)    800 mg tablets that the MD prescribed for pain.    I personally reviewed active problem list, medication list, allergies, family history, social history with the patient/caregiver today.   ROS  Constitutional: Negative for fever or significant weight change.  Respiratory: positive  For intermittent  cough ( states triggered by allergies) but no  shortness of breath.   Cardiovascular: Negative for chest pain or palpitations.  Gastrointestinal: Negative for abdominal pain, no bowel changes.  Musculoskeletal: positive  for gait problem and  joint swelling.  Skin: Negative for rash.  Neurological: Negative for  dizziness, positive for chronic daily  headache.  No other specific complaints in a complete review of systems (except as listed in HPI above).  Objective  Vitals:   01/03/19 0945 01/03/19 0947  BP: 140/70 140/70  Pulse:  99  Resp:  16  Temp: 98.4 F (36.9 C) 98.3 F (36.8 C)  TempSrc: Oral Oral  SpO2:  97%  Weight:  275 lb 12.8 oz (125.1 kg)  Height:  6' (1.829 m)    Body mass index is 37.41 kg/m.  Physical Exam  Constitutional: Patient appears well-developed and well-nourished. Obese  No distress.  HEENT: head atraumatic, normocephalic,  pupils equal and reactive to light,  neck supple, throat within normal limits Cardiovascular: Normal rate, regular rhythm and normal heart sounds.  No murmur heard. No BLE edema. Pulmonary/Chest: Effort normal and breath sounds normal. No respiratory distress. Abdominal: Soft.  There is no tenderness. Muscular skeletal: swelling of both hands, decrease flexion of right, decrease rom of motion of right shoulder, tender during palpation of lumbar spine  Psychiatric: Patient has a normal mood and affect. behavior is normal. Judgment and thought content normal.  PHQ2/9: Depression screen [facility] 2/9 07/03/2018 12/21/2017 06/21/2017 12/20/2016 05/02/2016  Decreased Interest 0 0 0 0 0  Down, Depressed, Hopeless 0 0 0 0 0  PHQ - 2 Score 0 0 0 0 0     Fall Risk: Fall Risk  01/03/2019 07/03/2018 03/28/2018 03/28/2018 12/21/2017  Falls in the past year? 0 No No No No     Functional Status Survey: Is the patient deaf or have difficulty hearing?: Yes(Tinnitus) Does the patient have difficulty seeing, even when wearing glasses/contacts?: No Does the patient have difficulty concentrating, remembering, or making decisions?: No Does the patient have difficulty walking or climbing stairs?: No Does the patient have difficulty dressing or bathing?: No Does the patient have difficulty doing errands alone such as visiting a doctor's office or shopping?:  No    Assessment & Plan  1. Flu vaccine need  - Flu Vaccine QUAD 36+ mos IM  2. Need for hepatitis C screening test  Today   3. Essential hypertension  - amLODipine-benazepril (LOTREL) 5-20 MG capsule; Take 1 capsule by mouth daily.  Dispense: 90 capsule; Refill: 1  4. Mixed hyperlipidemia  - atorvastatin (LIPITOR) 40 MG tablet; Take 1 tablet (40 mg total) by mouth daily.  Dispense: 90 tablet; Refill: 1 - MEGARED OMEGA-3 KRILL OIL 500 MG CAPS; Take 1 capsule by mouth 2 (two) times daily.  Dispense: 60 capsule; Refill: 0  5. Back muscle spasm  - tiZANidine (ZANAFLEX) 4 MG tablet; TAKE 1 TABLET BY MOUTH 3  TIMES DAILY AS NEEDED FOR  MUSCLE SPASM(S)  Dispense: 270 tablet; Refill: 1 - ibuprofen (ADVIL,MOTRIN) 200 MG tablet; Take 1 tablet (200 mg total) by mouth 2 (two) times daily.  Dispense: 60 tablet; Refill: 0  6. Perennial allergic rhinitis with seasonal variation  - loratadine (CLARITIN) 10 MG tablet; Take 1 tablet (10 mg total) by mouth 2 (two) times daily.  Dispense: 180 tablet; Refill: 1  7. Insomnia, unspecified type  - zolpidem (AMBIEN CR) 6.25 MG CR tablet; Take 1 tablet (6.25 mg total) by mouth at bedtime as needed for sleep.  Dispense: 90 tablet; Refill: 0  8. Chronic pain syndrome  Stable and chronic   9. 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

## 2019-01-04 LAB — LIPID PANEL
CHOLESTEROL TOTAL: 186 mg/dL (ref 100–199)
Chol/HDL Ratio: 5.2 ratio — ABNORMAL HIGH (ref 0.0–5.0)
HDL: 36 mg/dL — AB (ref >39)
LDL Calculated: 93 mg/dL (ref 0–99)
TRIGLYCERIDES: 287 mg/dL — AB (ref 0–149)
VLDL Cholesterol Cal: 57 mg/dL — ABNORMAL HIGH (ref 5–40)

## 2019-01-04 LAB — HEPATITIS C ANTIBODY

## 2019-01-04 LAB — COMPREHENSIVE METABOLIC PANEL
A/G RATIO: 1.6 (ref 1.2–2.2)
ALBUMIN: 4.2 g/dL (ref 3.8–4.9)
ALT: 52 IU/L — ABNORMAL HIGH (ref 0–44)
AST: 33 IU/L (ref 0–40)
Alkaline Phosphatase: 80 IU/L (ref 39–117)
BUN / CREAT RATIO: 15 (ref 10–24)
BUN: 12 mg/dL (ref 8–27)
Bilirubin Total: 0.2 mg/dL (ref 0.0–1.2)
CO2: 22 mmol/L (ref 20–29)
CREATININE: 0.8 mg/dL (ref 0.76–1.27)
Calcium: 9.6 mg/dL (ref 8.6–10.2)
Chloride: 104 mmol/L (ref 96–106)
GFR calc Af Amer: 112 mL/min/{1.73_m2} (ref >59)
GFR calc non Af Amer: 97 mL/min/{1.73_m2} (ref >59)
GLOBULIN, TOTAL: 2.6 g/dL (ref 1.5–4.5)
Glucose: 95 mg/dL (ref 65–99)
Potassium: 4.8 mmol/L (ref 3.5–5.2)
SODIUM: 143 mmol/L (ref 134–144)
Total Protein: 6.8 g/dL (ref 6.0–8.5)

## 2019-01-04 LAB — HEMOGLOBIN A1C
Est. average glucose Bld gHb Est-mCnc: 131 mg/dL
Hgb A1c MFr Bld: 6.2 % — ABNORMAL HIGH (ref 4.8–5.6)

## 2019-01-07 ENCOUNTER — Encounter: Payer: Self-pay | Admitting: Family Medicine

## 2019-01-07 DIAGNOSIS — R7303 Prediabetes: Secondary | ICD-10-CM | POA: Insufficient documentation

## 2019-05-26 ENCOUNTER — Other Ambulatory Visit: Payer: Self-pay | Admitting: Family Medicine

## 2019-05-26 DIAGNOSIS — I10 Essential (primary) hypertension: Secondary | ICD-10-CM

## 2019-05-26 DIAGNOSIS — E782 Mixed hyperlipidemia: Secondary | ICD-10-CM

## 2019-07-04 ENCOUNTER — Ambulatory Visit: Payer: Managed Care, Other (non HMO) | Admitting: Family Medicine

## 2019-07-04 ENCOUNTER — Ambulatory Visit: Payer: Managed Care, Other (non HMO)

## 2019-07-04 ENCOUNTER — Encounter: Payer: Self-pay | Admitting: Family Medicine

## 2019-07-04 ENCOUNTER — Other Ambulatory Visit: Payer: Self-pay

## 2019-07-04 VITALS — BP 134/72 | HR 92 | Temp 97.3°F | Resp 16 | Ht 72.0 in | Wt 279.1 lb

## 2019-07-04 DIAGNOSIS — M5417 Radiculopathy, lumbosacral region: Secondary | ICD-10-CM

## 2019-07-04 DIAGNOSIS — M62830 Muscle spasm of back: Secondary | ICD-10-CM

## 2019-07-04 DIAGNOSIS — M6530 Trigger finger, unspecified finger: Secondary | ICD-10-CM

## 2019-07-04 DIAGNOSIS — K429 Umbilical hernia without obstruction or gangrene: Secondary | ICD-10-CM | POA: Diagnosis not present

## 2019-07-04 DIAGNOSIS — G4700 Insomnia, unspecified: Secondary | ICD-10-CM

## 2019-07-04 DIAGNOSIS — I10 Essential (primary) hypertension: Secondary | ICD-10-CM | POA: Diagnosis not present

## 2019-07-04 MED ORDER — TIZANIDINE HCL 4 MG PO TABS: ORAL_TABLET | ORAL | 1 refills | Status: DC

## 2019-07-04 MED ORDER — AMLODIPINE BESY-BENAZEPRIL HCL 5-20 MG PO CAPS: 1.0000 | ORAL_CAPSULE | Freq: Every day | ORAL | 0 refills | Status: DC

## 2019-07-04 MED ORDER — ZOLPIDEM TARTRATE ER 6.25 MG PO TBCR: 6.2500 mg | EXTENDED_RELEASE_TABLET | Freq: Every evening | ORAL | 0 refills | Status: DC | PRN

## 2019-07-04 NOTE — Progress Notes (Signed)
Name: Carl Burke   MRN: [id number]    DOB: 1956/01/25   Date:07/04/2019       Progress Note  Subjective  Chief Complaint  Chief Complaint  Patient presents with  . Follow-up    6 month F/U  . Hypertension    Denies any symptoms  . Chronic back pain, neck pain, radiculitis/also hands    Muscle cramps would like to discuss Tizanidine dosage-quit taking all pain medications  . Obesity  . Insomnia    Needs Ambien to go to [employer]  . Hand Pain    Onset-couple of months but has worsen in the past 2 months- Right hand has been locking up and is starting to lose his grip    HPI  HTN: he denies chest pain or palpitation, taking medication daily, bp is at goal today. Unchanged   Trigger finger: 1st and 3rd fingers of right hand, going on for month, and is painful when he stretches his finger. Discussed referral to Ortho   Hyperglycemia: he denies polyphagia, polydipsia or polyuria.last hgbA1C was 6.2% and stable , reminded him of a diabetic diet. He states he has been the same for many years.   Chronic back pain, neck pain, radiculitis/also hands: used to take narcotics , seen by pain clinic, but he states medications did not help, only on Zanaflex three times a day. Pain right now is 4-5/10 and stable.   Dyslipidemia: takingAtorvastatin  But off Vascepa because of cost, he also stopped otc supplements because of cost.   Obesity: discussed life style modification, his weight is stable since last visit.   Umbilical hernia: hernia has been present for many years, however since muscle cramps more often, and seems to be triggered by crossing his legs, he has been feeling more of a discomfort that goes from the umbilical area to both sides, also needs to hold his umbilicus to sit up from laying position   Insomnia: chronic, takes Ambien every other night and it helps him fall and stay asleep. He gets 90 days but lasts 6 months , he is due for refills today   Patient Active  Problem List   Diagnosis Date Noted  . Pre-diabetes 01/07/2019  . Degeneration of lumbosacral intervertebral disc 03/28/2018  . Full thickness rotator cuff tear 03/28/2018  . Neck pain 03/28/2018  . Hyperlipidemia 12/20/2016  . Chronic shoulder pain (Right) 04/12/2016  . Cervical spondylosis 04/12/2016  . Chronic upper back pain (Location of Secondary source of pain) (Bilateral) (midline) 04/12/2016  . Essential hypertension 04/12/2016  . Lumbosacral radiculopathy at S1 (Left) 04/12/2016    Past Surgical History:  Procedure Laterality Date  . COLONOSCOPY    . COLONOSCOPY WITH PROPOFOL N/A 08/23/2018   Procedure: COLONOSCOPY WITH PROPOFOL;  Surgeon: Lin Landsman, MD;  Location: [facility] ENDOSCOPY;  Service: Gastroenterology;  Laterality: N/A;  . ESOPHAGOGASTRODUODENOSCOPY N/A 07/19/2016   Procedure: ESOPHAGOGASTRODUODENOSCOPY (EGD);  Surgeon: Gatha Mayer, MD;  Location: [facility] ENDOSCOPY;  Service: Endoscopy;  Laterality: N/A;  . ESOPHAGOGASTRODUODENOSCOPY (EGD) WITH PROPOFOL N/A 08/23/2018   Procedure: ESOPHAGOGASTRODUODENOSCOPY (EGD) WITH PROPOFOL;  Surgeon: Lin Landsman, MD;  Location: [facility] ENDOSCOPY;  Service: Gastroenterology;  Laterality: N/A;  . ROTATOR CUFF REPAIR Left 2011  . ROTATOR CUFF REPAIR Right 2013    Family History  Problem Relation Age of Onset  . Heart disease Mother   . Cancer Sister   . Cancer Sister     Social History   Socioeconomic History  . Marital status: Married  Spouse name: Blanch Media  . Number of children: 3  . Years of education: Not on file  . Highest education level: Not on file  Occupational History  . Occupation: retired   Scientific laboratory technician  . Financial resource strain: Not hard at all  . Food insecurity    Worry: Never true    Inability: Never true  . Transportation needs    Medical: No    Non-medical: No  Tobacco Use  . Smoking status: Current Some Day Smoker    Packs/day: 0.20    Years: 15.00    Pack years: 3.00    Types:  Cigarettes  . Smokeless tobacco: Former Systems developer    Types: [place] date: 01/16/1974  . Tobacco comment: used to smoke one pack daily for 10 years and now prn   Substance and Sexual Activity  . Alcohol use: Yes    Alcohol/week: 0.0 standard drinks    Comment: Rare/occassional   . Drug use: No  . Sexual activity: Yes    Partners: Female  Lifestyle  . Physical activity    Days per week: 0 days    Minutes per session: 0 min  . Stress: Not on file  Relationships  . Social Herbalist on phone: Not on file    Gets together: Not on file    Attends religious service: Not on file    Active member of club or organization: Not on file    Attends meetings of clubs or organizations: Not on file    Relationship status: Not on file  . Intimate partner violence    Fear of current or ex partner: No    Emotionally abused: No    Physically abused: No    Forced sexual activity: No  Other Topics Concern  . 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  amLODipine-benazepril (LOTREL) 5-20 MG capsule, TAKE 1 CAPSULE BY MOUTH  DAILY, Disp: 90 capsule, Rfl: 0 .  atorvastatin (LIPITOR) 40 MG tablet, TAKE 1 TABLET BY MOUTH  DAILY, Disp: 90 tablet, Rfl: 0 .  ibuprofen (ADVIL,MOTRIN) 200 MG tablet, Take 1 tablet (200 mg total) by mouth 2 (two) times daily., Disp: 60 tablet, Rfl: 0 .  loratadine (CLARITIN) 10 MG tablet, Take 1 tablet (10 mg total) by mouth 2 (two) times daily., Disp: 180 tablet, Rfl: 1 .  tiZANidine (ZANAFLEX) 4 MG tablet, TAKE 1 TABLET BY MOUTH 3  TIMES DAILY AS NEEDED FOR  MUSCLE SPASM(S), Disp: 270 tablet, Rfl: 1 .  zolpidem (AMBIEN CR) 6.25 MG CR tablet, Take 1 tablet (6.25 mg total) by mouth at bedtime as needed for sleep., Disp: 90 tablet, Rfl: 0 .  ALLERGY RELIEF 10 MG tablet, , Disp: , Rfl:  .  MEGARED OMEGA-3  KRILL OIL 500 MG CAPS, Take 1 capsule by mouth 2 (two) times daily. (Patient not taking: Reported on 07/04/2019), Disp: 60 capsule, Rfl: 0  Allergies  Allergen Reactions  . Naproxen Sodium Rash and Other (See Comments)    800 mg tablets that the MD prescribed for pain.    I personally reviewed active problem list, medication list, allergies, family history, social history with the patient/caregiver today.   ROS  Constitutional: Negative for fever or weight change.  Respiratory: Negative for cough  and shortness of breath.   Cardiovascular: Negative for chest pain or palpitations.  Gastrointestinal: Negative for abdominal pain, no bowel changes.  Musculoskeletal: Negative for gait problem or joint swelling.  Skin: Negative for rash.  Neurological: Negative for dizziness or headache.  No other specific complaints in a complete review of systems (except as listed in HPI above).  Objective  Vitals:   07/04/19 1023  BP: 134/72  Pulse: 92  Resp: 16  Temp: (!) 97.3 F (36.3 C)  TempSrc: Temporal  SpO2: 99%  Weight: 279 lb 1.6 oz (126.6 kg)  Height: 6' (1.829 m)    Body mass index is 37.85 kg/m.  Physical Exam  Constitutional: Patient appears well-developed and well-nourished. Obese No distress.  HEENT: head atraumatic, normocephalic, pupils equal and reactive to light, neck supple Cardiovascular: Normal rate, regular rhythm and normal heart sounds.  No murmur heard. Trace  BLE edema. Pulmonary/Chest: Effort normal and breath sounds normal. No respiratory distress. Muscular Skeletal: pain during palpation of lumbar spine - right lower, but negative straight leg raise , trigger finger right hand 1st and 3rd finger  Abdominal: Soft.  There is tenderness during palpation of umbilical hernia, no redness or increase in warmth  Psychiatric: Patient has a normal mood and affect. behavior is normal. Judgment and thought content normal.  PHQ2/9: Depression screen [facility] 2/9 07/04/2019  07/03/2018 12/21/2017 06/21/2017 12/20/2016  Decreased Interest 0 0 0 0 0  Down, Depressed, Hopeless 0 0 0 0 0  PHQ - 2 Score 0 0 0 0 0  Altered sleeping 0 - - - -  Tired, decreased energy 0 - - - -  Change in appetite 0 - - - -  Feeling bad or failure about yourself  0 - - - -  Trouble concentrating 0 - - - -  Moving slowly or fidgety/restless 0 - - - -  Suicidal thoughts 0 - - - -  PHQ-9 Score 0 - - - -  Difficult doing work/chores Not difficult at all - - - -    phq 9 is negative   Fall Risk: Fall Risk  07/04/2019 01/03/2019 07/03/2018 03/28/2018 03/28/2018  Falls in the past year? 0 0 No No No  Number falls in past yr: 0 - - - -  Injury with Fall? 0 - - - -     Functional Status Survey: Is the patient deaf or have difficulty hearing?: Yes(Tinnitus) Does the patient have difficulty seeing, even when wearing glasses/contacts?: No Does the patient have difficulty concentrating, remembering, or making decisions?: No Does the patient have difficulty walking or climbing stairs?: No Does the patient have difficulty dressing or bathing?: No Does the patient have difficulty doing errands alone such as visiting a doctor's office or shopping?: No    Assessment & Plan  1. Essential hypertension  - amLODipine-benazepril (LOTREL) 5-20 MG capsule; Take 1 capsule by mouth daily.  Dispense: 90 capsule; Refill: 0  2. Insomnia, unspecified type  - zolpidem (AMBIEN CR) 6.25 MG CR tablet; Take 1 tablet (6.25 mg total) by mouth at bedtime as needed for sleep.  Dispense: 90 tablet; Refill: 0  3. Back muscle spasm  - tiZANidine (ZANAFLEX) 4 MG tablet; TAKE 1 TABLET BY MOUTH 3  TIMES DAILY AS NEEDED FOR  MUSCLE SPASM(S)  Dispense: 360 tablet; Refill: 1  4. Umbilical hernia without obstruction and without gangrene  - Ambulatory referral to General Surgery  5. Trigger finger of right hand, unspecified finger  - Ambulatory referral to Orthopedic  Surgery  6. Morbid obesity ([place])  BMI above 35  with co-morbidity  Discussed with the patient the risk posed by an increased BMI. Discussed importance of portion control, calorie counting and at least 150 minutes of physical activity weekly. Avoid sweet beverages and drink more water. Eat at least 6 servings of fruit and vegetables daily    7. Lumbosacral radiculopathy at S1 (Left)  Continue medication

## 2019-07-17 ENCOUNTER — Ambulatory Visit: Payer: Managed Care, Other (non HMO) | Admitting: Surgery

## 2019-07-17 ENCOUNTER — Encounter: Payer: Self-pay | Admitting: Surgery

## 2019-07-17 ENCOUNTER — Other Ambulatory Visit: Payer: Self-pay

## 2019-07-17 VITALS — BP 169/87 | HR 79 | Temp 97.9°F | Resp 18 | Ht 72.0 in | Wt 279.6 lb

## 2019-07-17 DIAGNOSIS — K429 Umbilical hernia without obstruction or gangrene: Secondary | ICD-10-CM | POA: Diagnosis not present

## 2019-07-17 NOTE — Patient Instructions (Addendum)
Please call our office if you have any questions or concerns.   Umbilical Hernia, Adult  A hernia is a bulge of tissue that pushes through an opening between muscles. An umbilical hernia happens in the abdomen, near the belly button (umbilicus). The hernia may contain tissues from the small intestine, large intestine, or fatty tissue covering the intestines (omentum). Umbilical hernias in adults tend to get worse over time, and they require surgical treatment. There are several types of umbilical hernias. You may have:  A hernia located just above or below the umbilicus (indirect hernia). This is the most common type of umbilical hernia in adults.  A hernia that forms through an opening formed by the umbilicus (direct hernia).  A hernia that comes and goes (reducible hernia). A reducible hernia may be visible only when you strain, lift something heavy, or cough. This type of hernia can be pushed back into the abdomen (reduced).  A hernia that traps abdominal tissue inside the hernia (incarcerated hernia). This type of hernia cannot be reduced.  A hernia that cuts off blood flow to the tissues inside the hernia (strangulated hernia). The tissues can start to die if this happens. This type of hernia requires emergency treatment. What are the causes? An umbilical hernia happens when tissue inside the abdomen presses on a weak area of the abdominal muscles. What increases the risk? You may have a greater risk of this condition if you:  Are obese.  Have had several pregnancies.  Have a buildup of fluid inside your abdomen (ascites).  Have had surgery that weakens the abdominal muscles. What are the signs or symptoms? The main symptom of this condition is a painless bulge at or near the belly button. A reducible hernia may be visible only when you strain, lift something heavy, or cough. Other symptoms may include:  Dull pain.  A feeling of pressure. Symptoms of a strangulated hernia may  include:  Pain that gets increasingly worse.  Nausea and vomiting.  Pain when pressing on the hernia.  Skin over the hernia becoming red or purple.  Constipation.  Blood in the stool. How is this diagnosed? This condition may be diagnosed based on:  A physical exam. You may be asked to cough or strain while standing. These actions increase the pressure inside your abdomen and force the hernia through the opening in your muscles. Your health care provider may try to reduce the hernia by pressing on it.  Your symptoms and medical history. How is this treated? Surgery is the only treatment for an umbilical hernia. Surgery for a strangulated hernia is done as soon as possible. If you have a small hernia that is not incarcerated, you may need to lose weight before having surgery. Follow these instructions at home:  Lose weight, if told by your health care provider.  Do not try to push the hernia back in.  Watch your hernia for any changes in color or size. Tell your health care provider if any changes occur.  You may need to avoid activities that increase pressure on your hernia.  Do not lift anything that is heavier than 10 lb (4.5 kg) until your health care provider says that this is safe.  Take over-the-counter and prescription medicines only as told by your health care provider.  Keep all follow-up visits as told by your health care provider. This is important. Contact a health care provider if:  Your hernia gets larger.  Your hernia becomes painful. Get help right away  if:  You develop sudden, severe pain near the area of your hernia.  You have pain as well as nausea or vomiting.  You have pain and the skin over your hernia changes color.  You develop a fever. This information is not intended to replace advice given to you by your health care provider. Make sure you discuss any questions you have with your health care provider. Document Released: 04/29/2016 Document  Revised: 01/10/2018 Document Reviewed: 05/29/2017 Elsevier Patient Education  2020 [employer].

## 2019-07-17 NOTE — Progress Notes (Signed)
07/17/2019  Reason for Visit:  Umbilical hernia  Referring Provider:  Steele Sizer, MD  History of Present Illness: Carl Burke is a 63 y.o. adult presenting for evaluation of umbilical hernia.  Patient reports he's had it for about 20 years.  He also has a significant history of back pain with radiating pain that goes from the back across to the front towards the umbilicus on both sides.  His pain for long has been controlled with different medications, including muscle relaxants.  However, recently his doses have been decreased.  He reports that he's feeling swelling along each side of the umbilicus where the hernia is located.  The hernia itself is not causing more pain, but he feels more cramping of the muscles around the hernia.  He knows the hernia is growing in size overall and is worried that the issues with the muscles are related to his hernia.  Denies any fevers, chills, chest pain, shortness of breath.  Reports some constipation but no nausea or vomiting.  The hernia is reducible.  From the beginning, he mentions that he is not interested in any surgery unless it's absolutely necessary.  Past Medical History: Past Medical History:  Diagnosis Date  . Acute blood loss anemia 07/17/2016  . Acute duodenal ulcer with bleeding 07/17/2016  . Arthritis   . Colon cancer screening   . Duodenal ulcer due to bacteria   . Helicobacter pylori gastritis   . Helicobacter pylori gastritis    Rx 1) PPI pantoprazole 40 mg qd x 2 mos 2) Pepto Bismol 2 tabs (262 mg each) 4 times a day x 14 d 3) Metronidazole 250 mg 4 times a day x 14 d 4) doxycycline 100 mg 2 times a day x 14 d   In 4 weeks after treatment completed do H. Pylori stool antigen - dx H. Pylori gastritis   . Helicobacter pylori infection   . Hyperlipidemia   . Hypertension   . Long term prescription opiate use 04/12/2016  . Lumbosacral radiculopathy at S1 (Left) 04/12/2016  . Opiate use 04/12/2016  . Opiate use (300 MME/Day) 04/12/2016   Fentanyl patch 100 g per hour every 72 hours + oxycodone/APAP 10/325 one every 6 hours (40 mg/day).      Past Surgical History: Past Surgical History:  Procedure Laterality Date  . COLONOSCOPY    . COLONOSCOPY WITH PROPOFOL N/A 08/23/2018   Procedure: COLONOSCOPY WITH PROPOFOL;  Surgeon: Lin Landsman, MD;  Location: [facility] ENDOSCOPY;  Service: Gastroenterology;  Laterality: N/A;  . ESOPHAGOGASTRODUODENOSCOPY N/A 07/19/2016   Procedure: ESOPHAGOGASTRODUODENOSCOPY (EGD);  Surgeon: Gatha Mayer, MD;  Location: [facility] ENDOSCOPY;  Service: Endoscopy;  Laterality: N/A;  . ESOPHAGOGASTRODUODENOSCOPY (EGD) WITH PROPOFOL N/A 08/23/2018   Procedure: ESOPHAGOGASTRODUODENOSCOPY (EGD) WITH PROPOFOL;  Surgeon: Lin Landsman, MD;  Location: [facility] ENDOSCOPY;  Service: Gastroenterology;  Laterality: N/A;  . ROTATOR CUFF REPAIR Left 2011  . ROTATOR CUFF REPAIR Right 2013    Home Medications: Prior to Admission medications   Medication Sig Start Date End Date Taking? Authorizing Provider  ALLERGY RELIEF 10 MG tablet  04/01/19  Yes [provider]  amLODipine-benazepril (LOTREL) 5-20 MG capsule Take 1 capsule by mouth daily. 07/04/19  Yes Sowles, Drue Stager, MD  atorvastatin (LIPITOR) 40 MG tablet TAKE 1 TABLET BY MOUTH  DAILY 05/27/19  Yes Sowles, Drue Stager, MD  ibuprofen (ADVIL,MOTRIN) 200 MG tablet Take 1 tablet (200 mg total) by mouth 2 (two) times daily. 01/03/19  Yes Steele Sizer, MD  loratadine (CLARITIN) 10  MG tablet Take 1 tablet (10 mg total) by mouth 2 (two) times daily. 01/03/19  Yes Sowles, Drue Stager, MD  tiZANidine (ZANAFLEX) 4 MG tablet TAKE 1 TABLET BY MOUTH 3  TIMES DAILY AS NEEDED FOR  MUSCLE SPASM(S) 07/04/19  Yes Sowles, Drue Stager, MD  zolpidem (AMBIEN CR) 6.25 MG CR tablet Take 1 tablet (6.25 mg total) by mouth at bedtime as needed for sleep. 07/04/19 10/02/19 Yes Steele Sizer, MD    Allergies: Allergies  Allergen Reactions  . Naproxen Sodium Rash and Other (See Comments)    800  mg tablets that the MD prescribed for pain.    Social History:  reports that he has been smoking cigarettes. He has a 3.00 pack-year smoking history. He has never used smokeless tobacco. He reports current alcohol use. He reports that he does not use drugs.   Family History: Family History  Problem Relation Age of Onset  . Heart disease Mother   . Colon cancer Father   . Cancer Sister   . Cancer Sister     Review of Systems: Review of Systems  Constitutional: Negative for chills and fever.  HENT: Negative for hearing loss.   Respiratory: Negative for shortness of breath.   Cardiovascular: Negative for chest pain.  Gastrointestinal: Negative for abdominal pain, nausea and vomiting.  Genitourinary: Negative for dysuria.  Musculoskeletal: Positive for back pain, joint pain and myalgias.  Skin: Negative for rash.  Neurological: Negative for dizziness.  Psychiatric/Behavioral: Negative for depression.    Physical Exam BP (!) 169/87   Pulse 79   Temp 97.9 F (36.6 C) (Temporal)   Resp 18   Ht 6' (1.829 m)   Wt 279 lb 9.6 oz (126.8 kg)   SpO2 92%   BMI 37.92 kg/m  CONSTITUTIONAL: No acute distress HEENT:  Normocephalic, atraumatic, extraocular motion intact. NECK: Trachea is midline, and there is no jugular venous distension.  RESPIRATORY:  Lungs are clear, and breath sounds are equal bilaterally. Normal respiratory effort without pathologic use of accessory muscles. CARDIOVASCULAR: Heart is regular without murmurs, gallops, or rubs. GI: The abdomen is soft, obese, non-distended, non-tender to palpation.  Patient has a reducible umbilical hernia which is only sore to deep palpation. On either side of the umbilicus, the rectus muscle feels swollen like a lump on either side for an area about 5 cm longitudinal and 7 cm transverse.  No other hernia palpable. MUSCULOSKELETAL:  Normal muscle strength and tone in all four extremities.  No peripheral edema or cyanosis. SKIN: Skin  turgor is normal. There are no pathologic skin lesions.  NEUROLOGIC:  Motor and sensation is grossly normal.  Cranial nerves are grossly intact. PSYCH:  Alert and oriented to person, place and time. Affect is normal.  Laboratory Analysis: No results found for this or any previous visit (from the past 24 hour(s)).  Imaging: No results found.  Assessment and Plan: This is a 63 y.o. adult with an umbilical hernia.  The patient describes cramping and radiating pain from his back toward the umbilicus on either side.  The rectus muscle on either side of the umbilicus is swollen and tight.  I think the main complaint of the patient is related to muscle cramping and tightness rather than being related to his hernia.  The hernia itself is reducible and is only somewhat sore to palpation.  I think with the dose of his muscle relaxant being decreased, the muscle is going into spasm and cramping.   With this in mind, there is  currently no urgency to proceed with hernia repair.  I discussed with the patient the natural history of hernias, and that it will continue to get bigger and with time, he may develop symptoms.  He agrees that at that point, he will call us to schedule another appointment, but for now, with minimal symptoms, he would rather do watchful waiting.  That is reasonable at this point and we'll be happy to help him when he desires.   Face-to-face time spent with the patient and care providers was 40 minutes, with more than 50% of the time spent counseling, educating, and coordinating care of the patient.     Melvyn Neth, [facility]

## 2019-08-20 ENCOUNTER — Other Ambulatory Visit: Payer: Self-pay | Admitting: Family Medicine

## 2019-08-20 DIAGNOSIS — E782 Mixed hyperlipidemia: Secondary | ICD-10-CM

## 2019-08-21 NOTE — Telephone Encounter (Signed)
Requested medication (s) are due for refill today: yes  Requested medication (s) are on the active medication list: yes  Last refill: 06/26/2019  Future visit scheduled: yes  Notes to clinic:  Requesting a year supply   Requested Prescriptions  Pending Prescriptions Disp Refills   atorvastatin (LIPITOR) 40 MG tablet [Pharmacy Med Name: ATORVASTATIN  40MG   TAB] 90 tablet 3    Sig: TAKE 1 TABLET BY MOUTH  DAILY     Cardiovascular:  Antilipid - Statins Failed - 08/20/2019  9:12 PM      Failed - HDL in normal range and within 360 days    HDL  Date Value Ref Range Status  01/03/2019 36 (L) >39 mg/dL Final         Failed - Triglycerides in normal range and within 360 days    Triglycerides  Date Value Ref Range Status  01/03/2019 287 (H) 0 - 149 mg/dL Final         Passed - Total Cholesterol in normal range and within 360 days    Cholesterol, Total  Date Value Ref Range Status  01/03/2019 186 100 - 199 mg/dL Final         Passed - LDL in normal range and within 360 days    LDL Calculated  Date Value Ref Range Status  01/03/2019 93 0 - 99 mg/dL Final         Passed - Patient is not pregnant      Passed - Valid encounter within last 12 months    Recent Outpatient Visits          1 month ago Umbilical hernia without obstruction and without gangrene   [facility] Steele Sizer, MD   7 months ago Essential hypertension   [facility] Steele Sizer, MD   1 year ago Essential hypertension   [facility] Steele Sizer, MD   1 year ago Essential hypertension   [facility] Steele Sizer, MD   1 year ago Mixed hyperlipidemia   [facility] Roselee Nova, MD      Future Appointments            In 4 months Steele Sizer, MD [facility], [facility]

## 2019-10-30 ENCOUNTER — Ambulatory Visit: Payer: Managed Care, Other (non HMO) | Admitting: Family Medicine

## 2019-10-30 ENCOUNTER — Other Ambulatory Visit: Payer: Self-pay

## 2019-10-30 ENCOUNTER — Encounter: Payer: Self-pay | Admitting: Family Medicine

## 2019-10-30 VITALS — BP 118/78 | HR 78 | Temp 96.9°F | Resp 16 | Ht 72.0 in | Wt 273.4 lb

## 2019-10-30 DIAGNOSIS — E785 Hyperlipidemia, unspecified: Secondary | ICD-10-CM

## 2019-10-30 DIAGNOSIS — E782 Mixed hyperlipidemia: Secondary | ICD-10-CM

## 2019-10-30 DIAGNOSIS — M62830 Muscle spasm of back: Secondary | ICD-10-CM | POA: Diagnosis not present

## 2019-10-30 DIAGNOSIS — J3089 Other allergic rhinitis: Secondary | ICD-10-CM

## 2019-10-30 DIAGNOSIS — G894 Chronic pain syndrome: Secondary | ICD-10-CM

## 2019-10-30 DIAGNOSIS — M19042 Primary osteoarthritis, left hand: Secondary | ICD-10-CM

## 2019-10-30 DIAGNOSIS — L299 Pruritus, unspecified: Secondary | ICD-10-CM

## 2019-10-30 DIAGNOSIS — R739 Hyperglycemia, unspecified: Secondary | ICD-10-CM

## 2019-10-30 DIAGNOSIS — N6342 Unspecified lump in left breast, subareolar: Secondary | ICD-10-CM

## 2019-10-30 DIAGNOSIS — J302 Other seasonal allergic rhinitis: Secondary | ICD-10-CM

## 2019-10-30 DIAGNOSIS — G4700 Insomnia, unspecified: Secondary | ICD-10-CM | POA: Diagnosis not present

## 2019-10-30 DIAGNOSIS — N62 Hypertrophy of breast: Secondary | ICD-10-CM

## 2019-10-30 DIAGNOSIS — K429 Umbilical hernia without obstruction or gangrene: Secondary | ICD-10-CM

## 2019-10-30 DIAGNOSIS — I10 Essential (primary) hypertension: Secondary | ICD-10-CM

## 2019-10-30 DIAGNOSIS — M19041 Primary osteoarthritis, right hand: Secondary | ICD-10-CM

## 2019-10-30 MED ORDER — AMLODIPINE BESY-BENAZEPRIL HCL 5-20 MG PO CAPS: 1.0000 | ORAL_CAPSULE | Freq: Every day | ORAL | 0 refills | Status: DC

## 2019-10-30 MED ORDER — TIZANIDINE HCL 4 MG PO TABS: ORAL_TABLET | ORAL | 1 refills | Status: DC

## 2019-10-30 MED ORDER — ZOLPIDEM TARTRATE ER 6.25 MG PO TBCR: 6.2500 mg | EXTENDED_RELEASE_TABLET | Freq: Every evening | ORAL | 0 refills | Status: DC | PRN

## 2019-10-30 MED ORDER — LORATADINE 10 MG PO TABS: 10.0000 mg | ORAL_TABLET | Freq: Two times a day (BID) | ORAL | 1 refills | Status: DC

## 2019-10-30 MED ORDER — HYDROXYZINE HCL 10 MG PO TABS: 10.0000 mg | ORAL_TABLET | Freq: Three times a day (TID) | ORAL | 0 refills | Status: DC | PRN

## 2019-10-30 NOTE — Addendum Note (Signed)
Addended by: Inda Coke on: 10/30/2019 10:16 AM   Modules accepted: Orders

## 2019-10-30 NOTE — Addendum Note (Signed)
Addended by: Inda Coke on: 10/30/2019 09:52 AM   Modules accepted: Orders

## 2019-10-30 NOTE — Progress Notes (Signed)
Name: Carl Burke   MRN: [id number]    DOB: [birth year]   Date:10/30/2019       Progress Note  Subjective  Chief Complaint  Chief Complaint  Patient presents with  . Pruritis    Onset-2 weeks, all over his body, no rashes-no changes in personal hygiene-laundry soap or body washes.   . Nipple Swollen    On left side since all this itching has started. Tried Benadryl with no relief, Sarna and Claritin.    HPI  Pruritus: he states he was in [place] three weeks ago, they have a house down there and he was remodeling it. He states about two weeks ago , after he was back he noticed constant pruritis, no rashes, different parts of his body, he states it can be on palm of hands, bottom of his feet, back, scalp, inside his ears, genitalia. No change in hygiene products. He is not taking any medication for it at this time. No change in diet. No fever, chills. He states left nipple is sore and swollen, no nipple drainage  Breast mass: left nipple is tender, swollen and has a mass underneath, he noticed it about one week ago. No nipple discharge   HTN: he denies chest pain, dizziness  or palpitation, taking medication daily,bp is at goal today.  Trigger finger: 1st and 3rd fingers of right hand, he went to Ortho this Summer had steroid injections and symptoms resolved   Hyperglycemia: he denies polyphagia, polydipsia or polyuria. We will recheck labs today   Chronic back pain, neck pain, radiculitis/also hands: used to take narcotics , seen by pain clinic, but he states medications did not help, only on Zanaflex three times a day. Pain right now is 6/10 and stable.  Dyslipidemia: takingAtorvastatinBut off Vascepa because of cost, he also stopped otc supplements because of cost. We will recheck labs and if elevated we will send fish oil rx to [facility] since they have a Good Rx voucher   Obesity: discussed life style modification,his weight went down a little since last visit, it  is down 6 lbs  Umbilical hernia: hernia has been present for many years, he saw surgeon, he is wearing a compression vest , does not need surgery   Insomnia: chronic, takes Ambien every other night and it helps him fall and stay asleep.He gets 90 days but lasts about 5-6 months. Sometimes takes two if needed.  Patient Active Problem List   Diagnosis Date Noted  . Umbilical hernia without obstruction and without gangrene 07/17/2019  . Pre-diabetes 01/07/2019  . Degeneration of lumbosacral intervertebral disc 03/28/2018  . Full thickness rotator cuff tear 03/28/2018  . Neck pain 03/28/2018  . Hyperlipidemia 12/20/2016  . Chronic shoulder pain (Right) 04/12/2016  . Cervical spondylosis 04/12/2016  . Chronic upper back pain (Location of Secondary source of pain) (Bilateral) (midline) 04/12/2016  . Essential hypertension 04/12/2016  . Lumbosacral radiculopathy at S1 (Left) 04/12/2016    Past Surgical History:  Procedure Laterality Date  . COLONOSCOPY    . COLONOSCOPY WITH PROPOFOL N/A 08/23/2018   Procedure: COLONOSCOPY WITH PROPOFOL;  Surgeon: Lin Landsman, MD;  Location: [facility] ENDOSCOPY;  Service: Gastroenterology;  Laterality: N/A;  . ESOPHAGOGASTRODUODENOSCOPY N/A 07/19/2016   Procedure: ESOPHAGOGASTRODUODENOSCOPY (EGD);  Surgeon: Gatha Mayer, MD;  Location: [facility] ENDOSCOPY;  Service: Endoscopy;  Laterality: N/A;  . ESOPHAGOGASTRODUODENOSCOPY (EGD) WITH PROPOFOL N/A 08/23/2018   Procedure: ESOPHAGOGASTRODUODENOSCOPY (EGD) WITH PROPOFOL;  Surgeon: Lin Landsman, MD;  Location: [facility] ENDOSCOPY;  Service: Gastroenterology;  Laterality: N/A;  . ROTATOR CUFF REPAIR Left 2011  . ROTATOR CUFF REPAIR Right 2013    Family History  Problem Relation Age of Onset  . Heart disease Mother   . Colon cancer Father   . Cancer Sister   . Cancer Sister     Social History   Socioeconomic History  . Marital status: Married    Spouse name: Blanch Media  . Number of children: 3  . Years of  education: Not on file  . Highest education level: Not on file  Occupational History  . Occupation: retired   Scientific laboratory technician  . Financial resource strain: Not hard at all  . Food insecurity    Worry: Never true    Inability: Never true  . Transportation needs    Medical: No    Non-medical: No  Tobacco Use  . Smoking status: Current Some Day Smoker    Packs/day: 0.20    Years: 15.00    Pack years: 3.00    Types: Cigarettes  . Smokeless tobacco: Never Used  . Tobacco comment: used to smoke one pack daily for 10 years and now prn   Substance and Sexual Activity  . Alcohol use: Yes    Alcohol/week: 0.0 standard drinks    Comment: Rare/occassional   . Drug use: No  . Sexual activity: Yes    Partners: Female  Lifestyle  . Physical activity    Days per week: 0 days    Minutes per session: 0 min  . Stress: Not at all  Relationships  . Social connections    Talks on phone: More than three times a week    Gets together: More than three times a week    Attends religious service: 1 to 4 times per year    Active member of club or organization: No    Attends meetings of clubs or organizations: Never    Relationship status: Married  . Intimate partner violence    Fear of current or ex partner: No    Emotionally abused: No    Physically abused: No    Forced sexual activity: No  Other Topics Concern  . 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  ALLERGY RELIEF 10 MG tablet, , Disp: , Rfl:  .  amLODipine-benazepril (LOTREL) 5-20 MG capsule, Take 1 capsule by mouth daily., Disp: 90 capsule, Rfl: 0 .  atorvastatin (LIPITOR) 40 MG tablet, TAKE 1 TABLET BY MOUTH  DAILY, Disp: 90 tablet, Rfl: 3 .  ibuprofen (ADVIL,MOTRIN) 200 MG tablet, Take 1 tablet (200 mg total) by mouth 2 (two) times daily., Disp: 60 tablet, Rfl: 0 .   loratadine (CLARITIN) 10 MG tablet, Take 1 tablet (10 mg total) by mouth 2 (two) times daily., Disp: 180 tablet, Rfl: 1 .  tiZANidine (ZANAFLEX) 4 MG tablet, TAKE 1 TABLET BY MOUTH 3  TIMES DAILY AS NEEDED FOR  MUSCLE SPASM(S), Disp: 360 tablet, Rfl: 1 .  zolpidem (AMBIEN CR) 6.25 MG CR tablet, Take 1 tablet (6.25 mg total) by mouth at bedtime as needed for sleep., Disp: 90 tablet, Rfl: 0  Allergies  Allergen Reactions  . Naproxen Sodium Rash and Other (See Comments)    800 mg tablets that the MD prescribed for pain.    I personally reviewed active problem  list, medication list, allergies, family history, social history, health maintenance with the patient/caregiver today.   ROS  Constitutional: Negative for fever or weight change.  Respiratory: Negative for cough and shortness of breath.   Cardiovascular: Negative for chest pain or palpitations.  Gastrointestinal: Negative for abdominal pain, no bowel changes.  Musculoskeletal: Negative for gait problem or joint swelling.  Skin: Negative for rash.  Neurological: Negative for dizziness or headache.  No other specific complaints in a complete review of systems (except as listed in HPI above).  Objective  Vitals:   10/30/19 0902  BP: 118/78  Pulse: 78  Resp: 16  Temp: (!) 96.9 F (36.1 C)  TempSrc: Temporal  SpO2: 96%  Weight: 273 lb 6.4 oz (124 kg)  Height: 6' (1.829 m)    Body mass index is 37.08 kg/m.  Physical Exam  Constitutional: Patient appears well-developed and well-nourished. Obese  No distress.  HEENT: head atraumatic, normocephalic, pupils equal and reactive to light Cardiovascular: Normal rate, regular rhythm and normal heart sounds.  No murmur heard. No BLE edema. Pulmonary/Chest: Effort normal and breath sounds normal. No respiratory distress. Abdominal: Soft.  There is no tenderness. Breast: gynecomastia, left more than right ( he states normal for him), left nipple swollen and tender, no increase in  warmth, no increase in redness, small mass behind nipple.  Skin no rashes  Psychiatric: Patient has a normal mood and affect. behavior is normal. Judgment and thought content normal.  PHQ2/9: Depression screen [facility] 2/9 10/30/2019 07/04/2019 07/03/2018 12/21/2017 06/21/2017  Decreased Interest 0 0 0 0 0  Down, Depressed, Hopeless 0 0 0 0 0  PHQ - 2 Score 0 0 0 0 0  Altered sleeping 0 0 - - -  Tired, decreased energy 0 0 - - -  Change in appetite 0 0 - - -  Feeling bad or failure about yourself  0 0 - - -  Trouble concentrating 0 0 - - -  Moving slowly or fidgety/restless 0 0 - - -  Suicidal thoughts 0 0 - - -  PHQ-9 Score 0 0 - - -  Difficult doing work/chores Not difficult at all Not difficult at all - - -    phq 9 is negative   Fall Risk: Fall Risk  10/30/2019 07/17/2019 07/17/2019 07/04/2019 01/03/2019  Falls in the past year? 0 0 0 0 0  Number falls in past yr: 0 - 0 0 -  Injury with Fall? 0 - 0 0 -    Functional Status Survey: Is the patient deaf or have difficulty hearing?: No Does the patient have difficulty seeing, even when wearing glasses/contacts?: No Does the patient have difficulty concentrating, remembering, or making decisions?: No Does the patient have difficulty walking or climbing stairs?: No Does the patient have difficulty dressing or bathing?: No Does the patient have difficulty doing errands alone such as visiting a doctor's office or shopping?: No    Assessment & Plan  1. Essential hypertension  - CBC with Differential/Platelet - COMPLETE METABOLIC PANEL WITH GFR - amLODipine-benazepril (LOTREL) 5-20 MG capsule; Take 1 capsule by mouth daily.  Dispense: 90 capsule; Refill: 0  2. Insomnia, unspecified type  - zolpidem (AMBIEN CR) 6.25 MG CR tablet; Take 1 tablet (6.25 mg total) by mouth at bedtime as needed for sleep.  Dispense: 90 tablet; Refill: 0  3. Morbid obesity ([place])  Discussed with the patient the risk posed by an increased BMI. Discussed  importance of portion control, calorie counting and at  least 150 minutes of physical activity weekly. Avoid sweet beverages and drink more water. Eat at least 6 servings of fruit and vegetables daily   4. Back muscle spasm  - tiZANidine (ZANAFLEX) 4 MG tablet; TAKE 1 TABLET BY MOUTH 3  TIMES DAILY AS NEEDED FOR  MUSCLE SPASM(S)  Dispense: 360 tablet; Refill: 1  5. Umbilical hernia without obstruction and without gangrene  Seen by surgeon   6. Chronic pain syndrome  Stable  7. Mixed hyperlipidemia  - Lipid panel  8. Primary osteoarthritis of both hands  stable  9. Hyperglycemia  - Hemoglobin A1c  10. Dyslipidemia  On statin therapy   11. Pruritus of skin  - hydrOXYzine (ATARAX/VISTARIL) 10 MG tablet; Take 1-2 tablets (10-20 mg total) by mouth 3 (three) times daily as needed.  Dispense: 90 tablet; Refill: 0  12. Gynecomastia, male  - TSH - Prolactin - Testosterone Total,Free,Bio, Males  13. Subareolar mass of left breast  - MM Digital Diagnostic Bilat; Future - US BREAST LTD UNI RIGHT INC AXILLA; Future - US BREAST LTD UNI LEFT INC AXILLA; Future  Discussed seeing surgeon also and we will send him back to Dr. Hampton Abbot also   14. Perennial allergic rhinitis with seasonal variation  - loratadine (CLARITIN) 10 MG tablet; Take 1 tablet (10 mg total) by mouth 2 (two) times daily.  Dispense: 180 tablet; Refill: 1

## 2019-10-30 NOTE — Addendum Note (Signed)
Addended by: Steele Sizer F on: 10/30/2019 09:56 AM   Modules accepted: Orders

## 2019-10-31 ENCOUNTER — Encounter: Payer: Self-pay | Admitting: Family Medicine

## 2019-10-31 DIAGNOSIS — E785 Hyperlipidemia, unspecified: Secondary | ICD-10-CM | POA: Insufficient documentation

## 2019-10-31 LAB — CBC WITH DIFFERENTIAL/PLATELET
Absolute Monocytes: 889 cells/uL (ref 200–950)
Basophils Absolute: 68 cells/uL (ref 0–200)
Basophils Relative: 0.6 %
Eosinophils Absolute: 228 cells/uL (ref 15–500)
Eosinophils Relative: 2 %
HCT: 49.3 % (ref 38.5–50.0)
Hemoglobin: 16.7 g/dL (ref 13.2–17.1)
Lymphs Abs: 2816 cells/uL (ref 850–3900)
MCH: 30.5 pg (ref 27.0–33.0)
MCHC: 33.9 g/dL (ref 32.0–36.0)
MCV: 90 fL (ref 80.0–100.0)
MPV: 10.7 fL (ref 7.5–12.5)
Monocytes Relative: 7.8 %
Neutro Abs: 7399 cells/uL (ref 1500–7800)
Neutrophils Relative %: 64.9 %
Platelets: 265 10*3/uL (ref 140–400)
RBC: 5.48 10*6/uL (ref 4.20–5.80)
RDW: 13.5 % (ref 11.0–15.0)
Total Lymphocyte: 24.7 %
WBC: 11.4 10*3/uL — ABNORMAL HIGH (ref 3.8–10.8)

## 2019-10-31 LAB — COMPLETE METABOLIC PANEL WITH GFR
AG Ratio: 1.8 (calc) (ref 1.0–2.5)
ALT: 35 U/L (ref 9–46)
AST: 21 U/L (ref 10–35)
Albumin: 4.2 g/dL (ref 3.6–5.1)
Alkaline phosphatase (APISO): 68 U/L (ref 35–144)
BUN/Creatinine Ratio: 17 (calc) (ref 6–22)
BUN: 11 mg/dL (ref 7–25)
CO2: 30 mmol/L (ref 20–32)
Calcium: 9.3 mg/dL (ref 8.6–10.3)
Chloride: 102 mmol/L (ref 98–110)
Creat: 0.65 mg/dL — ABNORMAL LOW (ref 0.70–1.25)
GFR, Est African American: 122 mL/min/{1.73_m2} (ref >=60)
GFR, Est Non African American: 105 mL/min/{1.73_m2} (ref >=60)
Globulin: 2.3 g/dL (calc) (ref 1.9–3.7)
Glucose, Bld: 117 mg/dL — ABNORMAL HIGH (ref 65–99)
Potassium: 4.2 mmol/L (ref 3.5–5.3)
Sodium: 139 mmol/L (ref 135–146)
Total Bilirubin: 0.5 mg/dL (ref 0.2–1.2)
Total Protein: 6.5 g/dL (ref 6.1–8.1)

## 2019-10-31 LAB — TESTOSTERONE TOTAL,FREE,BIO, MALES
Albumin: 4.2 g/dL (ref 3.6–5.1)
Sex Hormone Binding: 32 nmol/L (ref 22–77)
Testosterone: 244 ng/dL — ABNORMAL LOW (ref 250–827)

## 2019-10-31 LAB — LIPID PANEL
Cholesterol: 158 mg/dL (ref <200)
HDL: 35 mg/dL — ABNORMAL LOW (ref >=40)
LDL Cholesterol (Calc): 89 mg/dL (calc)
Non-HDL Cholesterol (Calc): 123 mg/dL (calc) (ref <130)
Total CHOL/HDL Ratio: 4.5 (calc) (ref <5.0)
Triglycerides: 245 mg/dL — ABNORMAL HIGH (ref <150)

## 2019-10-31 LAB — HEMOGLOBIN A1C
Hgb A1c MFr Bld: 6.2 % of total Hgb — ABNORMAL HIGH (ref <5.7)
Mean Plasma Glucose: 131 (calc)
eAG (mmol/L): 7.3 (calc)

## 2019-10-31 LAB — TSH: TSH: 1.56 mIU/L (ref 0.40–4.50)

## 2019-10-31 LAB — PROLACTIN: Prolactin: 5.9 ng/mL (ref 2.0–18.0)

## 2019-11-01 ENCOUNTER — Ambulatory Visit
Admission: RE | Admit: 2019-11-01 | Discharge: 2019-11-01 | Disposition: A | Discharge status: Home / Self Care | Payer: Managed Care, Other (non HMO) | Source: Ambulatory Visit | Attending: Family Medicine | Admitting: Family Medicine

## 2019-11-01 DIAGNOSIS — N62 Hypertrophy of breast: Secondary | ICD-10-CM

## 2019-11-01 DIAGNOSIS — N6342 Unspecified lump in left breast, subareolar: Secondary | ICD-10-CM

## 2019-11-12 ENCOUNTER — Ambulatory Visit: Payer: Managed Care, Other (non HMO) | Admitting: Surgery

## 2019-11-28 IMAGING — MG DIGITAL DIAGNOSTIC BILAT W/ TOMO W/ CAD
6 of 10 series · 6 of 30 positions shown · non-contrast
Comparison: None.

ACR Breast Density Category a: The breast tissue is almost entirely
fatty.

CLINICAL DATA: 63-year-old male complaining of pain and swelling in
the subareolar region of the left breast.

EXAM:
DIGITAL DIAGNOSTIC BILATERAL MAMMOGRAM WITH CAD AND TOMO

[L MLO synth-2D]
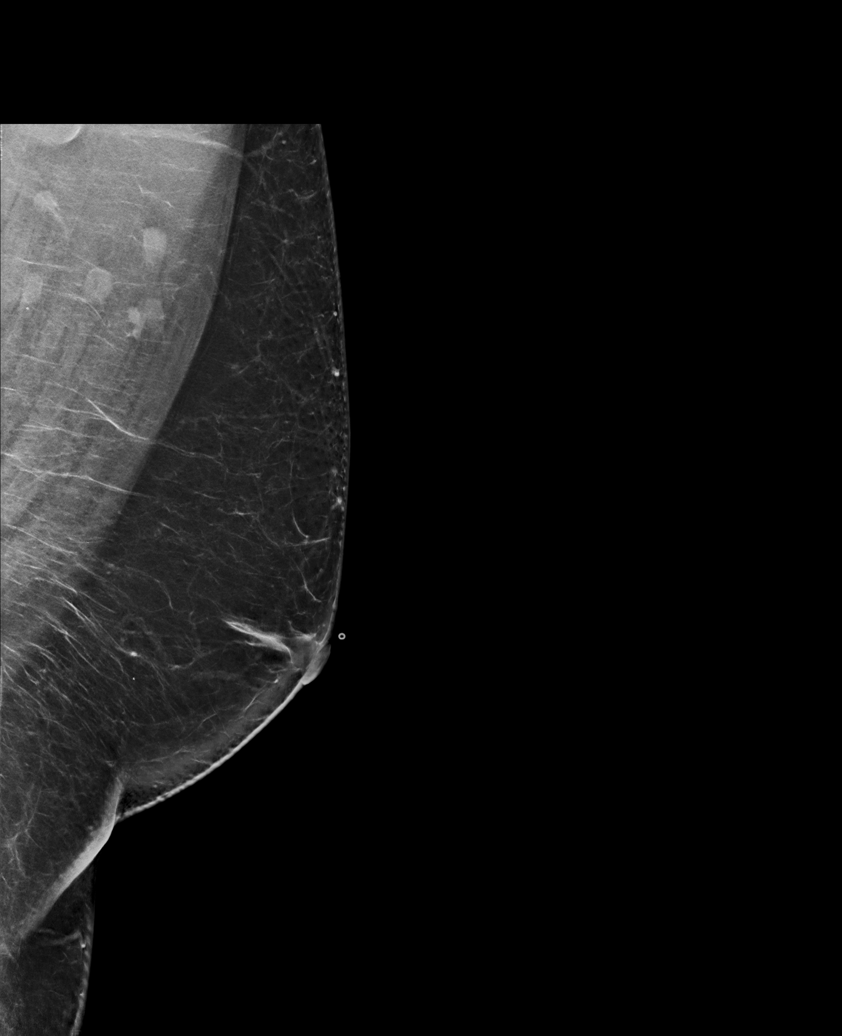

[R MLO synth-2D]
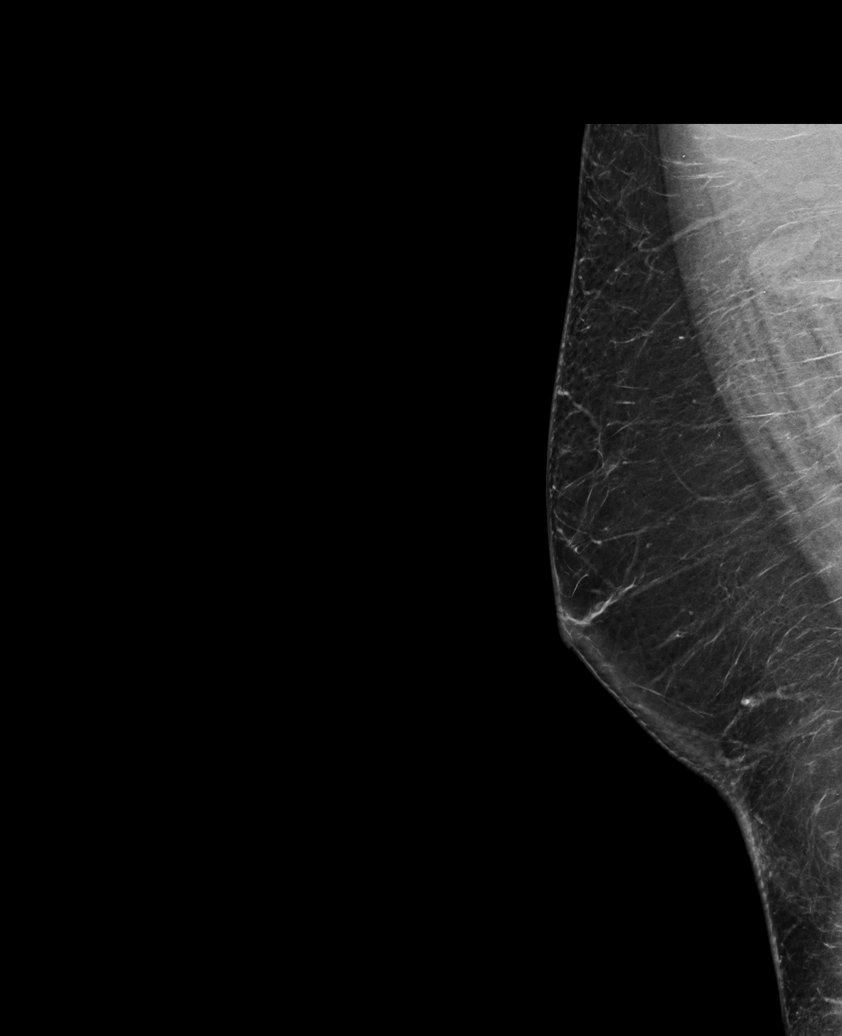

[L CC synth-2D]
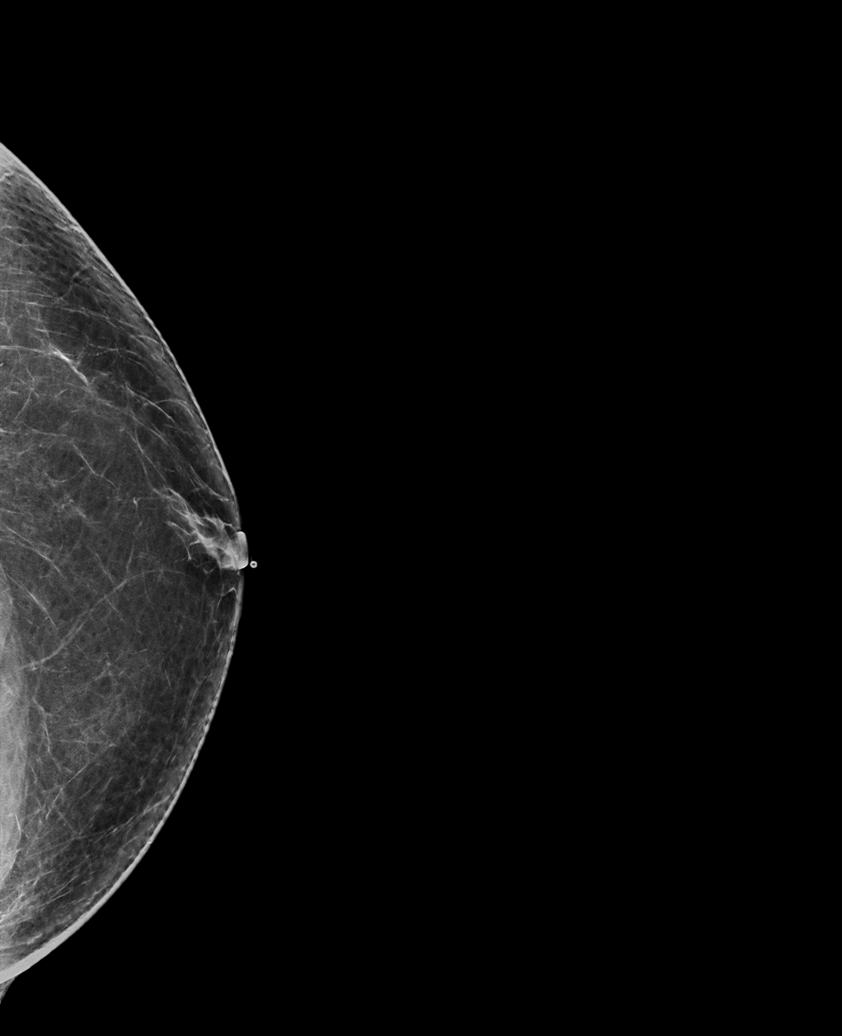

[L TAN synth-2D]
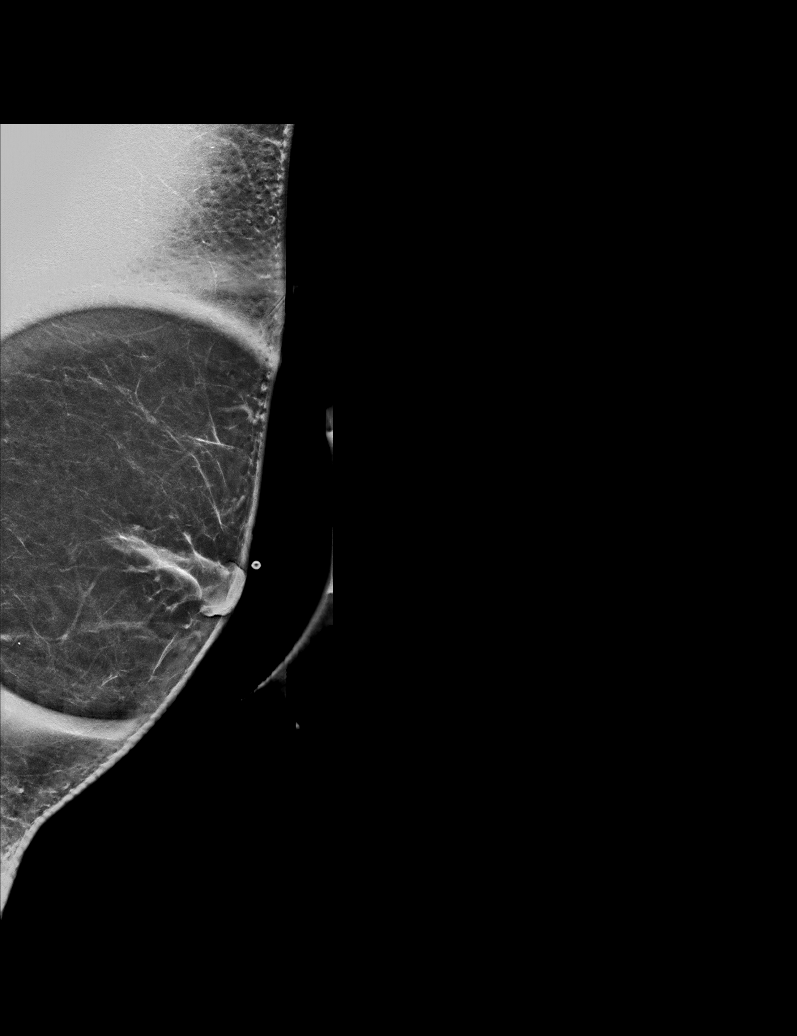

[R CC synth-2D]
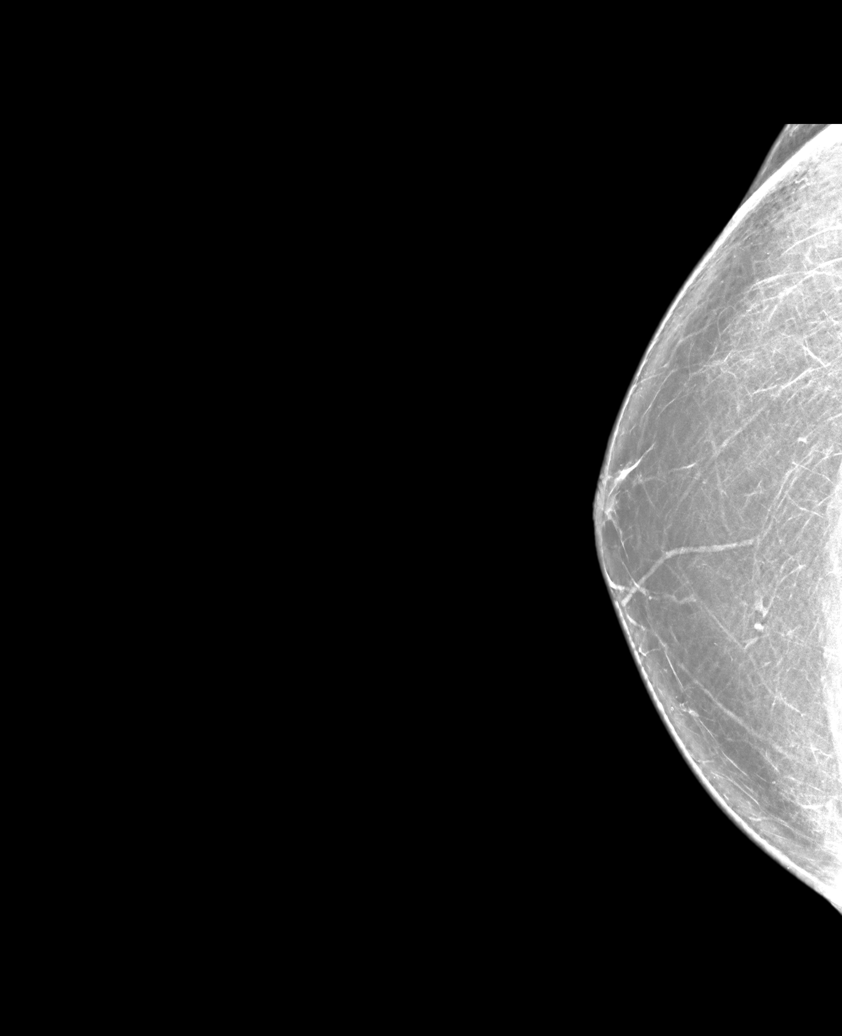

[L TAN tomo · tomo slice 35/69.0]
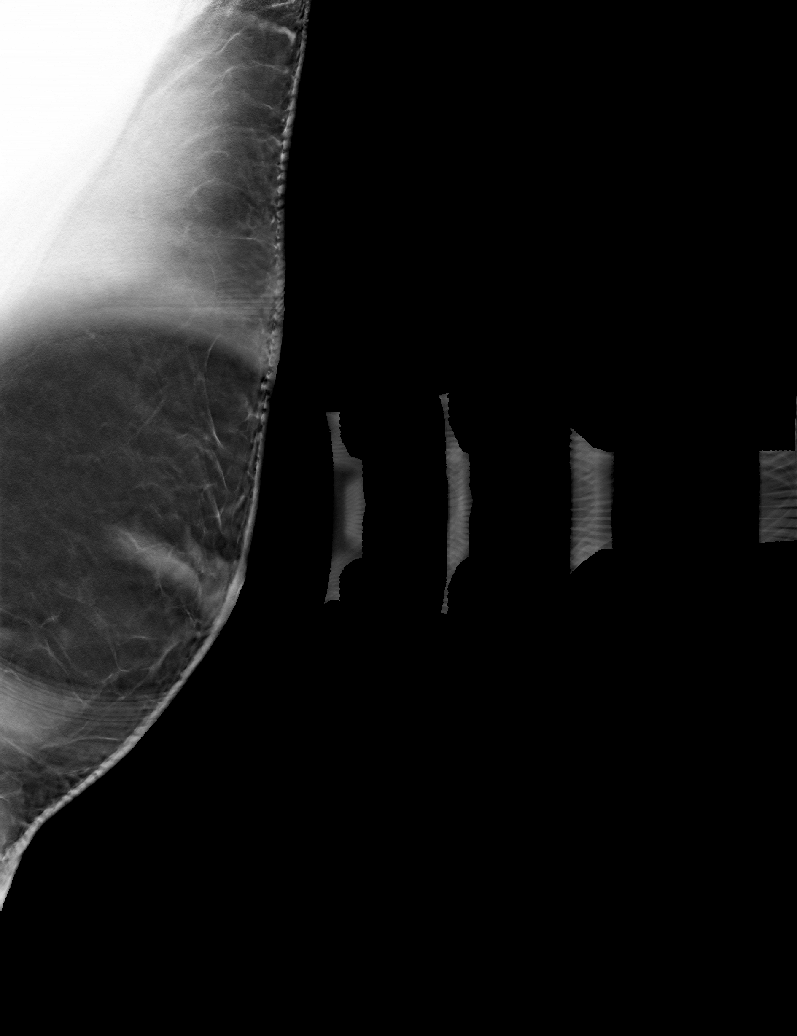

[6 of 30 positions shown; findings below may reference images not displayed]

FINDINGS: In the left subareolar region there is flame shaped fibroglandular
tissue with interspersed fat consistent with gynecomastia. No
suspicious mass or malignant type microcalcifications identified in
either breast.

Mammographic images were processed with CAD.
IMPRESSION: Left breast gynecomastia. No evidence of malignancy in either
breast.

RECOMMENDATION:
If the clinical exam remains benign/stable no follow-up is needed.

I have discussed the findings and recommendations with the patient.
If applicable, a reminder letter will be sent to the patient
regarding the next appointment.

BI-RADS CATEGORY  2: Benign.

## 2019-11-29 ENCOUNTER — Telehealth: Payer: Self-pay

## 2019-11-29 NOTE — Telephone Encounter (Signed)
Copied from [place] [phone]. Topic: General - Inquiry >> Nov 29, 2019  9:11 AM Richardo Priest, NT wrote: Reason for CRM: Pt's wife called in stating she would like the office to know that her husband is to only use [facility], as she works there, and not Licensed [employer] for any labwork. Pt's wife noticed they received a bill from [facility] and stated she thought she had made it clear on his chart to only use [facility]. Please advise.

## 2020-01-06 ENCOUNTER — Ambulatory Visit: Payer: Managed Care, Other (non HMO) | Admitting: Family Medicine

## 2020-01-29 ENCOUNTER — Other Ambulatory Visit: Payer: Self-pay | Admitting: Family Medicine

## 2020-01-29 DIAGNOSIS — I10 Essential (primary) hypertension: Secondary | ICD-10-CM

## 2020-02-27 ENCOUNTER — Ambulatory Visit: Payer: Managed Care, Other (non HMO) | Admitting: Family Medicine

## 2020-04-10 ENCOUNTER — Other Ambulatory Visit: Payer: Self-pay

## 2020-04-10 ENCOUNTER — Ambulatory Visit: Payer: Managed Care, Other (non HMO) | Admitting: Family Medicine

## 2020-04-10 ENCOUNTER — Encounter: Payer: Self-pay | Admitting: Family Medicine

## 2020-04-10 DIAGNOSIS — G4700 Insomnia, unspecified: Secondary | ICD-10-CM

## 2020-04-10 DIAGNOSIS — J3089 Other allergic rhinitis: Secondary | ICD-10-CM

## 2020-04-10 DIAGNOSIS — G894 Chronic pain syndrome: Secondary | ICD-10-CM

## 2020-04-10 DIAGNOSIS — I10 Essential (primary) hypertension: Secondary | ICD-10-CM

## 2020-04-10 DIAGNOSIS — R739 Hyperglycemia, unspecified: Secondary | ICD-10-CM

## 2020-04-10 DIAGNOSIS — E782 Mixed hyperlipidemia: Secondary | ICD-10-CM

## 2020-04-10 DIAGNOSIS — M5417 Radiculopathy, lumbosacral region: Secondary | ICD-10-CM

## 2020-04-10 DIAGNOSIS — J302 Other seasonal allergic rhinitis: Secondary | ICD-10-CM

## 2020-04-10 DIAGNOSIS — M62830 Muscle spasm of back: Secondary | ICD-10-CM | POA: Diagnosis not present

## 2020-04-10 MED ORDER — LORATADINE 10 MG PO TABS: 10.0000 mg | ORAL_TABLET | Freq: Two times a day (BID) | ORAL | 1 refills | Status: DC

## 2020-04-10 MED ORDER — TIZANIDINE HCL 4 MG PO TABS: ORAL_TABLET | ORAL | 1 refills | Status: DC

## 2020-04-10 MED ORDER — ZOLPIDEM TARTRATE ER 6.25 MG PO TBCR: 6.2500 mg | EXTENDED_RELEASE_TABLET | Freq: Every evening | ORAL | 0 refills | Status: DC | PRN

## 2020-04-10 MED ORDER — AMLODIPINE BESY-BENAZEPRIL HCL 5-20 MG PO CAPS: 1.0000 | ORAL_CAPSULE | Freq: Every day | ORAL | 0 refills | Status: DC

## 2020-04-10 NOTE — Progress Notes (Signed)
Name: Carl Burke   MRN: [id number]    DOB: 04-05-1956   Date:04/10/2020       Progress Note  Subjective  Chief Complaint  Chief Complaint  Patient presents with  . Medication Refill    [facility] ONLY!!! needs refill on all medications  . Hypertension    chronic headaches  . Chronic back pain  . Dyslipidemia  . Insomnia  . Obesity  . Fatigue    Chronic fatigue    HPI   Breast mass: left nipple was tender, we checked mammogram back in 10/2019 and was clear. He states it comes and goes, no pain at this time  HTN: he denies chest pain or palpitation, taking medication daily. BP is low today, he states at home it was 130's. He states slightly dizzy today but has not been drinking enough fluids.   Hyperglycemia: he denies polyphagia, polydipsia or polyuria. Last A1C is stable at 6.2%   Chronic back pain, neck pain, radiculitis/also hands: used to take narcotics , seen by pain clinic, but he states medications did not help, only on Zanaflex three times a day. Pain right now is 5/10 and stable.He also has bilateral knee pain and the pain is worse on his knees now 8/10 wearing a brace  Dyslipidemia: takingAtorvastatinbut off  Vascepa because of cost,discussed rechecking labs but he would like to hold off until next visit, advised to start taking baby aspirin but he wants to hold off because he is taking ibuprofen  Morbid obesity : discussed life style modification,he has HTN, chronic back pain, dyslipidemia. Discussed importance of losing weight.   Umbilical hernia: hernia has been present for many years, he saw surgeon, he is wearing a compression vest , does not need surgery . Unchanged   Insomnia: chronic, takes Ambien every other night and it helps him fall and stay asleep.He gets 90 days but lasts about 5-6 months. He needs refill of medication    Patient Active Problem List   Diagnosis Date Noted  . Dyslipidemia 10/31/2019  . Umbilical hernia without  obstruction and without gangrene 07/17/2019  . Pre-diabetes 01/07/2019  . Degeneration of lumbosacral intervertebral disc 03/28/2018  . Full thickness rotator cuff tear 03/28/2018  . Neck pain 03/28/2018  . Hyperlipidemia 12/20/2016  . Chronic shoulder pain (Right) 04/12/2016  . Cervical spondylosis 04/12/2016  . Chronic upper back pain (Location of Secondary source of pain) (Bilateral) (midline) 04/12/2016  . Essential hypertension 04/12/2016  . Lumbosacral radiculopathy at S1 (Left) 04/12/2016  . Rotator cuff arthropathy of right shoulder 08/15/2013    Past Surgical History:  Procedure Laterality Date  . COLONOSCOPY    . COLONOSCOPY WITH PROPOFOL N/A 08/23/2018   Procedure: COLONOSCOPY WITH PROPOFOL;  Surgeon: Lin Landsman, MD;  Location: [facility] ENDOSCOPY;  Service: Gastroenterology;  Laterality: N/A;  . ESOPHAGOGASTRODUODENOSCOPY N/A 07/19/2016   Procedure: ESOPHAGOGASTRODUODENOSCOPY (EGD);  Surgeon: Gatha Mayer, MD;  Location: [facility] ENDOSCOPY;  Service: Endoscopy;  Laterality: N/A;  . ESOPHAGOGASTRODUODENOSCOPY (EGD) WITH PROPOFOL N/A 08/23/2018   Procedure: ESOPHAGOGASTRODUODENOSCOPY (EGD) WITH PROPOFOL;  Surgeon: Lin Landsman, MD;  Location: [facility] ENDOSCOPY;  Service: Gastroenterology;  Laterality: N/A;  . ROTATOR CUFF REPAIR Left 2011  . ROTATOR CUFF REPAIR Right 2013    Family History  Problem Relation Age of Onset  . Heart disease Mother   . Colon cancer Father   . Cancer Sister   . Breast cancer Sister 62  . Cancer Sister     Social History   Tobacco Use  .  Smoking status: Current Some Day Smoker    Packs/day: 0.20    Years: 15.00    Pack years: 3.00    Types: Cigarettes  . Smokeless tobacco: Never Used  . Tobacco comment: used to smoke one pack daily for 10 years and now prn   Substance Use Topics  . Alcohol use: Yes    Alcohol/week: 0.0 standard drinks    Comment: Rare/occassional      Current Outpatient Medications:  .  amLODipine-benazepril  (LOTREL) 5-20 MG capsule, TAKE 1 CAPSULE BY MOUTH  DAILY, Disp: 90 capsule, Rfl: 0 .  atorvastatin (LIPITOR) 40 MG tablet, TAKE 1 TABLET BY MOUTH  DAILY, Disp: 90 tablet, Rfl: 3 .  hydrOXYzine (ATARAX/VISTARIL) 10 MG tablet, Take 1-2 tablets (10-20 mg total) by mouth 3 (three) times daily as needed., Disp: 90 tablet, Rfl: 0 .  ibuprofen (ADVIL,MOTRIN) 200 MG tablet, Take 1 tablet (200 mg total) by mouth 2 (two) times daily., Disp: 60 tablet, Rfl: 0 .  loratadine (CLARITIN) 10 MG tablet, Take 1 tablet (10 mg total) by mouth 2 (two) times daily., Disp: 180 tablet, Rfl: 1 .  tiZANidine (ZANAFLEX) 4 MG tablet, TAKE 1 TABLET BY MOUTH 3  TIMES DAILY AS NEEDED FOR  MUSCLE SPASM(S), Disp: 360 tablet, Rfl: 1 .  zolpidem (AMBIEN CR) 6.25 MG CR tablet, Take 1 tablet (6.25 mg total) by mouth at bedtime as needed for sleep., Disp: 90 tablet, Rfl: 0  Allergies  Allergen Reactions  . Naproxen Sodium Rash and Other (See Comments)    800 mg tablets that the MD prescribed for pain.    I personally reviewed active problem list, medication list, allergies, family history, social history, health maintenance with the patient/caregiver today.   ROS  Constitutional: Negative for fever or weight change.  Respiratory: Negative for cough and shortness of breath.   Cardiovascular: Negative for chest pain or palpitations.  Gastrointestinal: Negative for abdominal pain, no bowel changes.  Musculoskeletal: Negative for gait problem or joint swelling.  Skin: Negative for rash.  Neurological: Negative for dizziness or headache.  No other specific complaints in a complete review of systems (except as listed in HPI above).  Objective  Vitals:   04/10/20 1026  BP: 112/76  Pulse: 80  Resp: 18  Temp: (!) 97.5 F (36.4 C)  TempSrc: Temporal  SpO2: 95%  Weight: 277 lb 4.8 oz (125.8 kg)  Height: 6' (1.829 m)    Body mass index is 37.61 kg/m.  Physical Exam  Constitutional: Patient appears well-developed and  well-nourished. Obese  No distress.  HEENT: head atraumatic, normocephalic, pupils equal and reactive to light Cardiovascular: Normal rate, regular rhythm and normal heart sounds.  No murmur heard. No BLE edema. Pulmonary/Chest: Effort normal and breath sounds normal. No respiratory distress. Abdominal: Soft.  There is no tenderness. Muscular Skeletal: pain during palpation of lumbar spine , negative straight leg raise Psychiatric: Patient has a normal mood and affect. behavior is normal. Judgment and thought content normal.  PHQ2/9: Depression screen [facility] 2/9 04/10/2020 10/30/2019 07/04/2019 07/03/2018 12/21/2017  Decreased Interest 0 0 0 0 0  Down, Depressed, Hopeless 0 0 0 0 0  PHQ - 2 Score 0 0 0 0 0  Altered sleeping 0 0 0 - -  Tired, decreased energy 0 0 0 - -  Change in appetite 0 0 0 - -  Feeling bad or failure about yourself  0 0 0 - -  Trouble concentrating 0 0 0 - -  Moving slowly  or fidgety/restless 0 0 0 - -  Suicidal thoughts 0 0 0 - -  PHQ-9 Score 0 0 0 - -  Difficult doing work/chores Not difficult at all Not difficult at all Not difficult at all - -    phq 9 is negative  Fall Risk: Fall Risk  04/10/2020 10/30/2019 07/17/2019 07/17/2019 07/04/2019  Falls in the past year? 0 0 0 0 0  Number falls in past yr: 0 0 - 0 0  Injury with Fall? 0 0 - 0 0     Functional Status Survey: Is the patient deaf or have difficulty hearing?: No Does the patient have difficulty seeing, even when wearing glasses/contacts?: No Does the patient have difficulty concentrating, remembering, or making decisions?: No Does the patient have difficulty walking or climbing stairs?: Yes(wearing bilateral knee braces from a fall on a ladder) Does the patient have difficulty dressing or bathing?: No Does the patient have difficulty doing errands alone such as visiting a doctor's office or shopping?: No    Assessment & Plan  1. Insomnia, unspecified type  - zolpidem (AMBIEN CR) 6.25 MG CR tablet;  Take 1 tablet (6.25 mg total) by mouth at bedtime as needed for sleep.  Dispense: 90 tablet; Refill: 0  2. Back muscle spasm  - tiZANidine (ZANAFLEX) 4 MG tablet; TAKE 1 TABLET BY MOUTH 3  TIMES DAILY AS NEEDED FOR  MUSCLE SPASM(S)  Dispense: 360 tablet; Refill: 1  3. Mixed hyperlipidemia  - Lipid panel  4. Essential hypertension  - amLODipine-benazepril (LOTREL) 5-20 MG capsule; Take 1 capsule by mouth daily.  Dispense: 90 capsule; Refill: 0 - CBC with Differential/Platelet - Comprehensive metabolic panel  5. Perennial allergic rhinitis with seasonal variation  - loratadine (CLARITIN) 10 MG tablet; Take 1 tablet (10 mg total) by mouth 2 (two) times daily.  Dispense: 180 tablet; Refill: 1  6. Morbid obesity ([place])  Discussed with the patient the risk posed by an increased BMI. Discussed importance of portion control, calorie counting and at least 150 minutes of physical activity weekly. Avoid sweet beverages and drink more water. Eat at least 6 servings of fruit and vegetables daily   7. Hyperglycemia  - Hemoglobin A1c  8. Lumbosacral radiculopathy at S1   9. Chronic pain syndrome  Stable on medication

## 2020-07-04 ENCOUNTER — Other Ambulatory Visit: Payer: Self-pay | Admitting: Family Medicine

## 2020-07-04 DIAGNOSIS — E782 Mixed hyperlipidemia: Secondary | ICD-10-CM

## 2020-07-04 DIAGNOSIS — I10 Essential (primary) hypertension: Secondary | ICD-10-CM

## 2020-07-13 ENCOUNTER — Other Ambulatory Visit: Payer: Self-pay | Admitting: Family Medicine

## 2020-07-13 DIAGNOSIS — G4700 Insomnia, unspecified: Secondary | ICD-10-CM

## 2020-07-13 NOTE — Telephone Encounter (Signed)
Requested medication (s) are due for refill today:  yes  Requested medication (s) are on the active medication list:  yes  Future visit scheduled:  Yes  Last Refill: 04/11/19;#90; no refills  Notes to Clinic: Medication is not delegated.   Requested Prescriptions  Pending Prescriptions Disp Refills   zolpidem (AMBIEN CR) 6.25 MG CR tablet [Pharmacy Med Name: ZOLPIDEM  6.25MG   TAB  EXTENDED RELEASE] 90 tablet     Sig: TAKE 1 TABLET BY MOUTH AT  BEDTIME AS NEEDED FOR SLEEP      Not Delegated - Psychiatry:  Anxiolytics/Hypnotics Failed - 07/13/2020 11:33 AM      Failed - This refill cannot be delegated      Failed - Urine Drug Screen completed in last 360 days.      Passed - Valid encounter within last 6 months    Recent Outpatient Visits           3 months ago Morbid obesity [facility])   [facility] Steele Sizer, MD   8 months ago Essential hypertension   [facility] Steele Sizer, MD   1 year ago Umbilical hernia without obstruction and without gangrene   [facility] Steele Sizer, MD   1 year ago Essential hypertension   [facility] Steele Sizer, MD   2 years ago Essential hypertension   [facility] Steele Sizer, MD       Future Appointments             In 3 months Ancil Boozer, Drue Stager, MD [facility], [facility]

## 2020-09-23 ENCOUNTER — Other Ambulatory Visit: Payer: Self-pay | Admitting: Family Medicine

## 2020-09-23 DIAGNOSIS — E782 Mixed hyperlipidemia: Secondary | ICD-10-CM

## 2020-09-23 DIAGNOSIS — I10 Essential (primary) hypertension: Secondary | ICD-10-CM

## 2020-09-29 NOTE — Progress Notes (Addendum)
Name: Carl Burke   MRN: [id number]    DOB: 05/09/1956   Date:09/30/2020       Progress Note  Subjective  Chief Complaint  Bilateral leg swelling   HPI  Breast mass: left nipple was tender, we checked mammogram back in 10/2019 and was clear. He states it comes and goes, no pain at this time .   Chronic bronchitis: he has been smoking since age 64, he states initially just a few cigarettes, in his 64's and 64 's he smoked up to 3 packs per day. He quit for a period of time but is now smoking again but about 5 cigarettes per day. 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 BP is at goal., continue current medications   Hyperglycemia: he denies polyphagia, polydipsia or polyuria.Last A1C is stable at 6.2% , explained he needs to have labs rechecked today   Chronic back pain, neck pain, radiculitis/also hands: used to take narcotics , seen by pain clinic, but he states medications did not help so he stopped everything , only on Zanaflex three times a day. Pain right now is4/10 and stable.He also has bilateral knee pain intermittently, but doing well today   Dyslipidemia: takingAtorvastatinbut off  Vascepa because of cost, we will recheck labs today   Morbid obesity : discussed life style modification,he has HTN, chronic back pain, dyslipidemia. He has lost 3 lbs since last visit, he is cutting on carbohydrates at this time.   Umbilical hernia: hernia has been present for many years,he saw surgeon, he is wearing a compression vest , does not need surgery. Stable   Insomnia: chronic, takes Ambien every other night and it helps him fall and stay asleep.He gets 90 days but  lastsabout 5-6 months. He needs refill of medication today . He states that if he takes it every night it does not work   History of alcoholism: he states used to drink a bottle of hard liquor per night in his 51's and 60 's he had 4 DUI's in his life time, but states now only drinks occasionally. We will monitor.   Skin lesion: he states lots of skin issues but over the past month he noticed on spot on right upper chest that grew in size and one morning it was bruised and bleeding in am, the size is smaller now   Patient Active Problem List   Diagnosis Date Noted  . Dyslipidemia 10/31/2019  . Umbilical hernia without obstruction and without gangrene 07/17/2019  . Pre-diabetes 01/07/2019  . Degeneration of lumbosacral intervertebral disc 03/28/2018  . Full thickness rotator cuff tear 03/28/2018  . Neck pain 03/28/2018  . Hyperlipidemia 12/20/2016  . Chronic shoulder pain (Right) 04/12/2016  . Cervical spondylosis 04/12/2016  . Chronic upper back pain (Location of Secondary source of pain) (Bilateral) (midline) 04/12/2016  . Essential hypertension 04/12/2016  . Lumbosacral radiculopathy at S1 (Left) 04/12/2016  . Rotator cuff arthropathy of right shoulder 08/15/2013    Past Surgical History:  Procedure Laterality Date  . COLONOSCOPY    . COLONOSCOPY WITH PROPOFOL N/A 08/23/2018   Procedure: COLONOSCOPY WITH PROPOFOL;  Surgeon: Lin Landsman, MD;  Location: [facility]  ENDOSCOPY;  Service: Gastroenterology;  Laterality: N/A;  . ESOPHAGOGASTRODUODENOSCOPY N/A 07/19/2016   Procedure: ESOPHAGOGASTRODUODENOSCOPY (EGD);  Surgeon: Gatha Mayer, MD;  Location: [facility] ENDOSCOPY;  Service: Endoscopy;  Laterality: N/A;  . ESOPHAGOGASTRODUODENOSCOPY (EGD) WITH PROPOFOL N/A 08/23/2018   Procedure: ESOPHAGOGASTRODUODENOSCOPY (EGD) WITH PROPOFOL;  Surgeon: Lin Landsman, MD;  Location: [facility] ENDOSCOPY;  Service: Gastroenterology;  Laterality: N/A;  . ROTATOR CUFF REPAIR Left 2011  . ROTATOR CUFF  REPAIR Right 2013    Family History  Problem Relation Age of Onset  . Heart disease Mother   . Colon cancer Father   . Cancer Sister   . Breast cancer Sister 50  . Cancer Sister     Social History   Tobacco Use  . Smoking status: Current Some Day Smoker    Packs/day: 0.20    Years: 15.00    Pack years: 3.00    Types: Cigarettes  . Smokeless tobacco: Never Used  . Tobacco comment: used to smoke one pack daily for 10 years and now prn   Substance Use Topics  . Alcohol use: Yes    Alcohol/week: 0.0 standard drinks    Comment: Rare/occassional      Current Outpatient Medications:  .  amLODipine-benazepril (LOTREL) 5-20 MG capsule, TAKE 1 CAPSULE BY MOUTH  DAILY, Disp: 90 capsule, Rfl: 0 .  atorvastatin (LIPITOR) 40 MG tablet, TAKE 1 TABLET BY MOUTH  DAILY, Disp: 90 tablet, Rfl: 0 .  ibuprofen (ADVIL,MOTRIN) 200 MG tablet, Take 1 tablet (200 mg total) by mouth 2 (two) times daily., Disp: 60 tablet, Rfl: 0 .  loratadine (CLARITIN) 10 MG tablet, Take 1 tablet (10 mg total) by mouth 2 (two) times daily., Disp: 180 tablet, Rfl: 1 .  tiZANidine (ZANAFLEX) 4 MG tablet, TAKE 1 TABLET BY MOUTH 3  TIMES DAILY AS NEEDED FOR  MUSCLE SPASM(S), Disp: 360 tablet, Rfl: 1 .  zolpidem (AMBIEN CR) 6.25 MG CR tablet, Take 1 tablet (6.25 mg total) by mouth at bedtime as needed for sleep., Disp: 90 tablet, Rfl: 0  Allergies  Allergen Reactions  . Naproxen Sodium Rash and Other (See Comments)    800 mg tablets that the MD prescribed for pain.    I personally reviewed active problem list, medication list, allergies, family history, social history, health maintenance with the patient/caregiver today.   ROS  Constitutional: Negative for fever or weight change.  Respiratory: Positive for cough and occasional shortness of breath.   Cardiovascular: Negative for chest pain or palpitations.  Gastrointestinal: Negative for abdominal pain, no bowel changes.  Musculoskeletal: Positive  for gait problem  , positive for joint swelling - hands/ chronic .  Skin: Negative for rash.  Neurological: Negative for dizziness or headache.  No other specific complaints in a complete review of systems (except as listed in HPI above).   Objective  Vitals:   09/30/20 1051  BP: 136/60  Pulse: 75  Resp: 16  Temp: 98.4 F (36.9 C)  TempSrc: Oral  SpO2: 98%  Weight: 274 lb 6.4 oz (124.5 kg)  Height: 6' (1.829 m)    Body mass index is 37.22 kg/m.  Physical Exam  Constitutional: Patient appears well-developed and well-nourished. Obese  No distress.  HEENT: head atraumatic, normocephalic, pupils equal and reactive to light, , neck supple Cardiovascular: Normal rate, regular rhythm and normal heart sounds.  No murmur heard. No BLE edema. Pulmonary/Chest: Effort normal and breath sounds normal. No respiratory distress. Abdominal: Soft.  There is no tenderness. Psychiatric: Patient has a  normal mood and affect. behavior is normal. Judgment and thought content normal. Skin: raised lesion, skin color on right upper chest, possible skin tag but could be Actinic keratosis we will refer him to dermatologist  Muscular Skeletal: pain during palpation of lumbar spine, moving around the room because of pain , hands are slightly swollen, also tender, difficulty making a fist  PHQ2/9: Depression screen [facility] 2/9 09/30/2020 04/10/2020 10/30/2019 07/04/2019 07/03/2018  Decreased Interest 0 0 0 0 0  Down, Depressed, Hopeless 0 0 0 0 0  PHQ - 2 Score 0 0 0 0 0  Altered sleeping - 0 0 0 -  Tired, decreased energy - 0 0 0 -  Change in appetite - 0 0 0 -  Feeling bad or failure about yourself  - 0 0 0 -  Trouble concentrating - 0 0 0 -  Moving slowly or fidgety/restless - 0 0 0 -  Suicidal thoughts - 0 0 0 -  PHQ-9 Score - 0 0 0 -  Difficult doing work/chores - Not difficult at all Not difficult at all Not difficult at all -    phq 9 is negative   Fall Risk: Fall Risk  09/30/2020 04/10/2020 10/30/2019 07/17/2019  07/17/2019  Falls in the past year? 0 0 0 0 0  Number falls in past yr: 0 0 0 - 0  Injury with Fall? 0 0 0 - 0    Functional Status Survey: Is the patient deaf or have difficulty hearing?: Yes Does the patient have difficulty seeing, even when wearing glasses/contacts?: No Does the patient have difficulty concentrating, remembering, or making decisions?: No Does the patient have difficulty walking or climbing stairs?: Yes Does the patient have difficulty dressing or bathing?: No Does the patient have difficulty doing errands alone such as visiting a doctor's office or shopping?: Yes    Assessment & Plan  1. Skin lesion of chest wall  - Ambulatory referral to Dermatology  2. Essential hypertension  - amLODipine-benazepril (LOTREL) 5-20 MG capsule; Take 1 capsule by mouth daily.  Dispense: 90 capsule; Refill: 1  3. Morbid obesity ([place])  Discussed with the patient the risk posed by an increased BMI. Discussed importance of portion control, calorie counting and at least 150 minutes of physical activity weekly. Avoid sweet beverages and drink more water. Eat at least 6 servings of fruit and vegetables daily   4. Lumbosacral radiculopathy at S1  stable  5. Chronic pain syndrome  Stable   6. Umbilical hernia without obstruction and without gangrene  Seen by surgeon   7. Perennial allergic rhinitis with seasonal variation  Does not want to use nasal spray  8. Mixed hyperlipidemia  Needs to have labs done   9. Simple chronic bronchitis (HCC)  Discussed inhalers but he would like to hold off for now  10. Tobacco use  Discussed importance of quitting   11. Primary osteoarthritis of both hands  Stable   12. Back muscle spasm  - tiZANidine (ZANAFLEX) 4 MG tablet; TAKE 1 TABLET BY MOUTH 3  TIMES DAILY AS NEEDED FOR  MUSCLE SPASM(S)  Dispense: 360 tablet; Refill: 1  13. Insomnia, unspecified type  - zolpidem (AMBIEN CR) 6.25 MG CR tablet; Take 1 tablet (6.25 mg  total) by mouth at bedtime as needed for sleep.  Dispense: 90 tablet; Refill: 0  14. History of alcoholism ([place])

## 2020-09-30 ENCOUNTER — Other Ambulatory Visit: Payer: Self-pay

## 2020-09-30 ENCOUNTER — Encounter: Payer: Self-pay | Admitting: Family Medicine

## 2020-09-30 ENCOUNTER — Ambulatory Visit: Payer: Managed Care, Other (non HMO) | Admitting: Family Medicine

## 2020-09-30 VITALS — BP 136/60 | HR 75 | Temp 98.4°F | Resp 16 | Ht 72.0 in | Wt 274.4 lb

## 2020-09-30 DIAGNOSIS — F1021 Alcohol dependence, in remission: Secondary | ICD-10-CM

## 2020-09-30 DIAGNOSIS — M5417 Radiculopathy, lumbosacral region: Secondary | ICD-10-CM

## 2020-09-30 DIAGNOSIS — G894 Chronic pain syndrome: Secondary | ICD-10-CM

## 2020-09-30 DIAGNOSIS — J410 Simple chronic bronchitis: Secondary | ICD-10-CM

## 2020-09-30 DIAGNOSIS — M19041 Primary osteoarthritis, right hand: Secondary | ICD-10-CM

## 2020-09-30 DIAGNOSIS — Z720 Tobacco use: Secondary | ICD-10-CM

## 2020-09-30 DIAGNOSIS — L989 Disorder of the skin and subcutaneous tissue, unspecified: Secondary | ICD-10-CM

## 2020-09-30 DIAGNOSIS — J3089 Other allergic rhinitis: Secondary | ICD-10-CM

## 2020-09-30 DIAGNOSIS — K429 Umbilical hernia without obstruction or gangrene: Secondary | ICD-10-CM

## 2020-09-30 DIAGNOSIS — E782 Mixed hyperlipidemia: Secondary | ICD-10-CM

## 2020-09-30 DIAGNOSIS — M62830 Muscle spasm of back: Secondary | ICD-10-CM

## 2020-09-30 DIAGNOSIS — J302 Other seasonal allergic rhinitis: Secondary | ICD-10-CM

## 2020-09-30 DIAGNOSIS — M19042 Primary osteoarthritis, left hand: Secondary | ICD-10-CM

## 2020-09-30 DIAGNOSIS — I10 Essential (primary) hypertension: Secondary | ICD-10-CM

## 2020-09-30 DIAGNOSIS — G4700 Insomnia, unspecified: Secondary | ICD-10-CM

## 2020-09-30 MED ORDER — TIZANIDINE HCL 4 MG PO TABS: ORAL_TABLET | ORAL | 1 refills | Status: DC

## 2020-09-30 MED ORDER — AMLODIPINE BESY-BENAZEPRIL HCL 5-20 MG PO CAPS: 1.0000 | ORAL_CAPSULE | Freq: Every day | ORAL | 1 refills | Status: DC

## 2020-09-30 MED ORDER — ZOLPIDEM TARTRATE ER 6.25 MG PO TBCR: 6.2500 mg | EXTENDED_RELEASE_TABLET | Freq: Every evening | ORAL | 0 refills | Status: DC | PRN

## 2020-09-30 NOTE — Patient Instructions (Signed)
Discuss with your wife lung cancer screen by lung CT

## 2020-10-06 LAB — LIPID PANEL

## 2020-10-07 LAB — COMPREHENSIVE METABOLIC PANEL
ALT: 49 IU/L — ABNORMAL HIGH (ref 0–44)
AST: 26 IU/L (ref 0–40)
Albumin/Globulin Ratio: 1.9 (ref 1.2–2.2)
Albumin: 4.3 g/dL (ref 3.8–4.8)
Alkaline Phosphatase: 79 IU/L (ref 44–121)
BUN/Creatinine Ratio: 16 (ref 10–24)
BUN: 14 mg/dL (ref 8–27)
Bilirubin Total: 0.4 mg/dL (ref 0.0–1.2)
CO2: 24 mmol/L (ref 20–29)
Calcium: 9.5 mg/dL (ref 8.6–10.2)
Chloride: 102 mmol/L (ref 96–106)
Creatinine, Ser: 0.85 mg/dL (ref 0.76–1.27)
GFR calc Af Amer: 108 mL/min/{1.73_m2} (ref >59)
GFR calc non Af Amer: 93 mL/min/{1.73_m2} (ref >59)
Globulin, Total: 2.3 g/dL (ref 1.5–4.5)
Glucose: 134 mg/dL — ABNORMAL HIGH (ref 65–99)
Potassium: 4.2 mmol/L (ref 3.5–5.2)
Sodium: 142 mmol/L (ref 134–144)
Total Protein: 6.6 g/dL (ref 6.0–8.5)

## 2020-10-07 LAB — CBC WITH DIFFERENTIAL/PLATELET
Basophils Absolute: 0.1 10*3/uL (ref 0.0–0.2)
Basos: 1 %
EOS (ABSOLUTE): 0.2 10*3/uL (ref 0.0–0.4)
Eos: 1 %
Hematocrit: 49.4 % (ref 37.5–51.0)
Hemoglobin: 16.8 g/dL (ref 13.0–17.7)
Immature Grans (Abs): 0 10*3/uL (ref 0.0–0.1)
Immature Granulocytes: 0 %
Lymphocytes Absolute: 3.4 10*3/uL — ABNORMAL HIGH (ref 0.7–3.1)
Lymphs: 23 %
MCH: 30.7 pg (ref 26.6–33.0)
MCHC: 34 g/dL (ref 31.5–35.7)
MCV: 90 fL (ref 79–97)
Monocytes Absolute: 0.9 10*3/uL (ref 0.1–0.9)
Monocytes: 6 %
Neutrophils Absolute: 10 10*3/uL — ABNORMAL HIGH (ref 1.4–7.0)
Neutrophils: 69 %
Platelets: 253 10*3/uL (ref 150–450)
RBC: 5.48 x10E6/uL (ref 4.14–5.80)
RDW: 13.9 % (ref 11.6–15.4)
WBC: 14.7 10*3/uL — ABNORMAL HIGH (ref 3.4–10.8)

## 2020-10-07 LAB — LIPID PANEL
Chol/HDL Ratio: 5.1 ratio — ABNORMAL HIGH (ref 0.0–5.0)
Cholesterol, Total: 203 mg/dL — ABNORMAL HIGH (ref 100–199)
HDL: 40 mg/dL (ref >39)
LDL Chol Calc (NIH): 110 mg/dL — ABNORMAL HIGH (ref 0–99)
Triglycerides: 309 mg/dL — ABNORMAL HIGH (ref 0–149)
VLDL Cholesterol Cal: 53 mg/dL — ABNORMAL HIGH (ref 5–40)

## 2020-10-07 LAB — HEMOGLOBIN A1C
Est. average glucose Bld gHb Est-mCnc: 134 mg/dL
Hgb A1c MFr Bld: 6.3 % — ABNORMAL HIGH (ref 4.8–5.6)

## 2020-10-12 ENCOUNTER — Ambulatory Visit: Payer: Managed Care, Other (non HMO) | Admitting: Family Medicine

## 2020-10-14 ENCOUNTER — Telehealth: Payer: Self-pay

## 2020-10-14 DIAGNOSIS — D72829 Elevated white blood cell count, unspecified: Secondary | ICD-10-CM

## 2020-10-14 NOTE — Telephone Encounter (Signed)
Copied from [place] [id number]. Topic: Referral - Request for Referral >> Oct 14, 2020  1:30 PM Erick Blinks wrote: Has patient seen PCP for this complaint? Yes.   *If NO, is insurance requiring patient see PCP for this issue before PCP can refer them? Referral for which specialty: Hematology Preferred provider/office: [facility]  Reason for referral: Per PCP  Fax: [phone]

## 2020-10-18 NOTE — Progress Notes (Signed)
[place]  [phone] Fax:(336) [phone]  ID: Carl Burke OB: 1956-11-29  MR#: [id number]  YTK#:[id number]  Patient Care Team: Steele Sizer, MD as PCP - General (Family Medicine)  CHIEF COMPLAINT:  Leukocytosis.  INTERVAL HISTORY: Patient is a 64 year old male who was noted to have an elevated white blood cell count on routine blood work.  He currently feels well and is asymptomatic.  He has some sinus congestion, but denies fever or treatment with antibiotics.  He has no new medications.  He has no neurologic complaints.  He denies any weakness or fatigue.  He has a good appetite and denies weight loss.  He has no chest pain, shortness of breath, cough, or hemoptysis.  He denies any nausea, vomiting, constipation, or diarrhea.  He has no urinary complaints.  Patient feels at his baseline and offers no specific complaints today.  REVIEW OF SYSTEMS:   Review of Systems  Constitutional: Negative.  Negative for fever, malaise/fatigue and weight loss.  Respiratory: Negative.  Negative for cough, hemoptysis and shortness of breath.   Cardiovascular: Negative.  Negative for chest pain and leg swelling.  Gastrointestinal: Negative.  Negative for abdominal pain.  Genitourinary: Negative.  Negative for dysuria.  Musculoskeletal: Negative.  Negative for back pain.  Skin: Negative.  Negative for rash.  Neurological: Negative.  Negative for dizziness, focal weakness, weakness and headaches.  Psychiatric/Behavioral: Negative.  The patient is not nervous/anxious.     As per HPI. Otherwise, a complete review of systems is negative.  PAST MEDICAL HISTORY: Past Medical History:  Diagnosis Date  . Acute blood loss anemia 07/17/2016  . Acute duodenal ulcer with bleeding 07/17/2016  . Arthritis   . Colon cancer screening   . Duodenal ulcer due to bacteria   . Helicobacter pylori gastritis   . Helicobacter pylori gastritis    Rx 1) PPI pantoprazole 40 mg qd x 2  mos 2) Pepto Bismol 2 tabs (262 mg each) 4 times a day x 14 d 3) Metronidazole 250 mg 4 times a day x 14 d 4) doxycycline 100 mg 2 times a day x 14 d   In 4 weeks after treatment completed do H. Pylori stool antigen - dx H. Pylori gastritis   . Helicobacter pylori infection   . Hyperlipidemia   . Hypertension   . Long term prescription opiate use 04/12/2016  . Lumbosacral radiculopathy at S1 (Left) 04/12/2016  . Opiate use 04/12/2016  . Opiate use (300 MME/Day) 04/12/2016   Fentanyl patch 100 g per hour every 72 hours + oxycodone/APAP 10/325 one every 6 hours (40 mg/day).     PAST SURGICAL HISTORY: Past Surgical History:  Procedure Laterality Date  . COLONOSCOPY    . COLONOSCOPY WITH PROPOFOL N/A 08/23/2018   Procedure: COLONOSCOPY WITH PROPOFOL;  Surgeon: Lin Landsman, MD;  Location: [facility] ENDOSCOPY;  Service: Gastroenterology;  Laterality: N/A;  . ESOPHAGOGASTRODUODENOSCOPY N/A 07/19/2016   Procedure: ESOPHAGOGASTRODUODENOSCOPY (EGD);  Surgeon: Gatha Mayer, MD;  Location: [facility] ENDOSCOPY;  Service: Endoscopy;  Laterality: N/A;  . ESOPHAGOGASTRODUODENOSCOPY (EGD) WITH PROPOFOL N/A 08/23/2018   Procedure: ESOPHAGOGASTRODUODENOSCOPY (EGD) WITH PROPOFOL;  Surgeon: Lin Landsman, MD;  Location[facility] ENDOSCOPY;  Service: Gastroenterology;  Laterality: N/A;  . ROTATOR CUFF REPAIR Left 2011  . ROTATOR CUFF REPAIR Right 2013    FAMILY HISTORY: Family History  Problem Relation Age of Onset  . Heart disease Mother   . Colon cancer Father   . Cancer Sister   . Breast cancer Sister  27  . Cancer Sister     ADVANCED DIRECTIVES (Y/N):  N  HEALTH MAINTENANCE: Social History   Tobacco Use  . Smoking status: Current Some Day Smoker    Packs/day: 0.50    Years: 50.00    Pack years: 25.00    Types: Cigarettes    Start date: 09/30/1970  . Smokeless tobacco: Never Used  . Tobacco comment: used to smoke one pack daily for 10 years and now prn   Vaping Use  . Vaping Use: Never used    Substance Use Topics  . Alcohol use: Yes    Alcohol/week: 0.0 standard drinks    Comment: Rare/occassional   . Drug use: No     Colonoscopy:  PAP:  Bone density:  Lipid panel:  Allergies  Allergen Reactions  . Naproxen Sodium Rash and Other (See Comments)    800 mg tablets that the MD prescribed for pain.    Current Outpatient Medications  Medication Sig Dispense Refill  . amLODipine-benazepril (LOTREL) 5-20 MG capsule Take 1 capsule by mouth daily. 90 capsule 1  . atorvastatin (LIPITOR) 40 MG tablet TAKE 1 TABLET BY MOUTH  DAILY 90 tablet 0  . ibuprofen (ADVIL,MOTRIN) 200 MG tablet Take 1 tablet (200 mg total) by mouth 2 (two) times daily. 60 tablet 0  . loratadine (CLARITIN) 10 MG tablet Take 1 tablet (10 mg total) by mouth 2 (two) times daily. 180 tablet 1  . tiZANidine (ZANAFLEX) 4 MG tablet TAKE 1 TABLET BY MOUTH 3  TIMES DAILY AS NEEDED FOR  MUSCLE SPASM(S) 360 tablet 1  . zolpidem (AMBIEN CR) 6.25 MG CR tablet Take 1 tablet (6.25 mg total) by mouth at bedtime as needed for sleep. 90 tablet 0   No current facility-administered medications for this visit.    OBJECTIVE: Vitals:   10/19/20 1504  BP: (!) 185/70  Pulse: 95  Resp: 20  Temp: 97.8 F (36.6 C)  SpO2: 97%     Body mass index is 37.51 kg/m.    ECOG FS:0 - Asymptomatic  General: Well-developed, well-nourished, no acute distress. Eyes: Pink conjunctiva, anicteric sclera. HEENT: Normocephalic, moist mucous membranes. Lungs: No audible wheezing or coughing. Heart: Regular rate and rhythm. Abdomen: Soft, nontender, no obvious distention. Musculoskeletal: No edema, cyanosis, or clubbing. Neuro: Alert, answering all questions appropriately. Cranial nerves grossly intact. Skin: No rashes or petechiae noted. Psych: Normal affect. Lymphatics: No cervical, calvicular, axillary or inguinal LAD.   LAB RESULTS:  Lab Results  Component Value Date   NA 142 10/06/2020   K 4.2 10/06/2020   CL 102 10/06/2020    CO2 24 10/06/2020   GLUCOSE 134 (H) 10/06/2020   BUN 14 10/06/2020   CREATININE 0.85 10/06/2020   CALCIUM 9.5 10/06/2020   PROT 6.6 10/06/2020   ALBUMIN 4.3 10/06/2020   AST 26 10/06/2020   ALT 49 (H) 10/06/2020   ALKPHOS 79 10/06/2020   BILITOT 0.4 10/06/2020   GFRNONAA 93 10/06/2020   GFRAA 108 10/06/2020    Lab Results  Component Value Date   WBC 14.7 (H) 10/06/2020   NEUTROABS 10.0 (H) 10/06/2020   HGB 16.8 10/06/2020   HCT 49.4 10/06/2020   MCV 90 10/06/2020   PLT 253 10/06/2020     STUDIES: No results found.  ASSESSMENT: Leukocytosis.  PLAN:    1.  Leukocytosis: Unclear etiology.  His most recent white blood count cell was elevated at 14.7.  He was noted to have increased neutrophils and lymphocytes.  Upon review of  his chart, patient has had an intermittently elevated white blood cell count since at least August 2017.  Repeat laboratory work from today is pending.  I have also ordered peripheral blood flow cytometry as well as BCR-ABL mutation for completeness.  No intervention is needed at this time.  Patient does not require bone marrow.  He will have a video assisted telemedicine visit in 3 weeks to discuss the results and any additional diagnostic planning necessary. 2.  Hypertension: Patient's blood pressure is moderately elevated today.  Continue monitoring and treatment per primary care.  I spent a total of 45 minutes reviewing chart data, face-to-face evaluation with the patient, counseling and coordination of care as detailed above.  Patient expressed understanding and was in agreement with this plan. He also understands that He can call clinic at any time with any questions, concerns, or complaints.     Lloyd Huger, MD   10/19/2020 4:18 PM

## 2020-10-19 ENCOUNTER — Other Ambulatory Visit: Payer: Self-pay

## 2020-10-19 ENCOUNTER — Inpatient Hospital Stay: Payer: Managed Care, Other (non HMO)

## 2020-10-19 ENCOUNTER — Encounter: Payer: Self-pay | Admitting: Oncology

## 2020-10-19 ENCOUNTER — Inpatient Hospital Stay: Payer: Managed Care, Other (non HMO) | Attending: Oncology | Admitting: Oncology

## 2020-10-19 VITALS — BP 185/70 | HR 95 | Temp 97.8°F | Resp 20 | Wt 276.6 lb

## 2020-10-19 DIAGNOSIS — Z803 Family history of malignant neoplasm of breast: Secondary | ICD-10-CM | POA: Diagnosis not present

## 2020-10-19 DIAGNOSIS — Z800 Family history of malignant neoplasm of digestive organs: Secondary | ICD-10-CM | POA: Diagnosis not present

## 2020-10-19 DIAGNOSIS — F17210 Nicotine dependence, cigarettes, uncomplicated: Secondary | ICD-10-CM | POA: Diagnosis not present

## 2020-10-19 DIAGNOSIS — R0981 Nasal congestion: Secondary | ICD-10-CM | POA: Insufficient documentation

## 2020-10-19 DIAGNOSIS — D72829 Elevated white blood cell count, unspecified: Secondary | ICD-10-CM

## 2020-10-19 DIAGNOSIS — I10 Essential (primary) hypertension: Secondary | ICD-10-CM | POA: Diagnosis not present

## 2020-11-04 NOTE — Progress Notes (Signed)
[place]  Telephone:(336) [phone] Fax:(336) [phone]  ID: Carl Burke OB: June 22, 1956  MR#: [id number]  VEL#:[id number]  Patient Care Team: Steele Sizer, MD as PCP - General (Family Medicine)  I connected with Carl Burke on 11/10/20 at  2:00 PM EST by video enabled telemedicine visit and verified that I am speaking with the correct person using two identifiers.   I discussed the limitations, risks, security and privacy concerns of performing an evaluation and management service by telemedicine and the availability of in-person appointments. I also discussed with the patient that there may be a patient responsible charge related to this service. The patient expressed understanding and agreed to proceed.   Other persons participating in the visit and their role in the encounter: Patient, MD.  Patient's location: Home. Provider's location: Clinic.  CHIEF COMPLAINT:  Leukocytosis.  INTERVAL HISTORY: Patient agreed to video assisted telemedicine visit for further evaluation and discussion of his laboratory results. He continues to feel well and remains relatively asymptomatic. He continues to have sinus congestion which is chronic and unchanged. He denies any fevers. He has no neurologic complaints.  He denies any weakness or fatigue.  He has a good appetite and denies weight loss.  He has no chest pain, shortness of breath, cough, or hemoptysis.  He denies any nausea, vomiting, constipation, or diarrhea.  He has no urinary complaints. Patient offers no further specific complaints today.  REVIEW OF SYSTEMS:   Review of Systems  Constitutional: Negative.  Negative for fever, malaise/fatigue and weight loss.  Respiratory: Negative.  Negative for cough, hemoptysis and shortness of breath.   Cardiovascular: Negative.  Negative for chest pain and leg swelling.  Gastrointestinal: Negative.  Negative for abdominal pain.  Genitourinary: Negative.  Negative for  dysuria.  Musculoskeletal: Negative.  Negative for back pain.  Skin: Negative.  Negative for rash.  Neurological: Negative.  Negative for dizziness, focal weakness, weakness and headaches.  Psychiatric/Behavioral: Negative.  The patient is not nervous/anxious.     As per HPI. Otherwise, a complete review of systems is negative.  PAST MEDICAL HISTORY: Past Medical History:  Diagnosis Date  . Acute blood loss anemia 07/17/2016  . Acute duodenal ulcer with bleeding 07/17/2016  . Arthritis   . Colon cancer screening   . Duodenal ulcer due to bacteria   . Helicobacter pylori gastritis   . Helicobacter pylori gastritis    Rx 1) PPI pantoprazole 40 mg qd x 2 mos 2) Pepto Bismol 2 tabs (262 mg each) 4 times a day x 14 d 3) Metronidazole 250 mg 4 times a day x 14 d 4) doxycycline 100 mg 2 times a day x 14 d   In 4 weeks after treatment completed do H. Pylori stool antigen - dx H. Pylori gastritis   . Helicobacter pylori infection   . Hyperlipidemia   . Hypertension   . Long term prescription opiate use 04/12/2016  . Lumbosacral radiculopathy at S1 (Left) 04/12/2016  . Opiate use 04/12/2016  . Opiate use (300 MME/Day) 04/12/2016   Fentanyl patch 100 g per hour every 72 hours + oxycodone/APAP 10/325 one every 6 hours (40 mg/day).     PAST SURGICAL HISTORY: Past Surgical History:  Procedure Laterality Date  . COLONOSCOPY    . COLONOSCOPY WITH PROPOFOL N/A 08/23/2018   Procedure: COLONOSCOPY WITH PROPOFOL;  Surgeon: Lin Landsman, MD;  Location: [facility] ENDOSCOPY;  Service: Gastroenterology;  Laterality: N/A;  . ESOPHAGOGASTRODUODENOSCOPY N/A 07/19/2016   Procedure: ESOPHAGOGASTRODUODENOSCOPY (EGD);  Surgeon: Glendell Docker  Simonne Maffucci, MD;  Location: [place];  Service: Endoscopy;  Laterality: N/A;  . ESOPHAGOGASTRODUODENOSCOPY (EGD) WITH PROPOFOL N/A 08/23/2018   Procedure: ESOPHAGOGASTRODUODENOSCOPY (EGD) WITH PROPOFOL;  Surgeon: Lin Landsman, MD;  Location: [facility] ENDOSCOPY;  Service:  Gastroenterology;  Laterality: N/A;  . ROTATOR CUFF REPAIR Left 2011  . ROTATOR CUFF REPAIR Right 2013    FAMILY HISTORY: Family History  Problem Relation Age of Onset  . Heart disease Mother   . Colon cancer Father   . Cancer Sister   . Breast cancer Sister 62  . Cancer Sister     ADVANCED DIRECTIVES (Y/N):  N  HEALTH MAINTENANCE: Social History   Tobacco Use  . Smoking status: Current Some Day Smoker    Packs/day: 0.50    Years: 50.00    Pack years: 25.00    Types: Cigarettes    Start date: 09/30/1970  . Smokeless tobacco: Never Used  . Tobacco comment: used to smoke one pack daily for 10 years and now prn   Vaping Use  . Vaping Use: Never used  Substance Use Topics  . Alcohol use: Yes    Alcohol/week: 0.0 standard drinks    Comment: Rare/occassional   . Drug use: No     Colonoscopy:  PAP:  Bone density:  Lipid panel:  Allergies  Allergen Reactions  . Naproxen Sodium Rash and Other (See Comments)    800 mg tablets that the MD prescribed for pain.    Current Outpatient Medications  Medication Sig Dispense Refill  . amLODipine-benazepril (LOTREL) 5-20 MG capsule Take 1 capsule by mouth daily. 90 capsule 1  . atorvastatin (LIPITOR) 40 MG tablet TAKE 1 TABLET BY MOUTH  DAILY 90 tablet 0  . ibuprofen (ADVIL,MOTRIN) 200 MG tablet Take 1 tablet (200 mg total) by mouth 2 (two) times daily. 60 tablet 0  . loratadine (CLARITIN) 10 MG tablet Take 1 tablet (10 mg total) by mouth 2 (two) times daily. 180 tablet 1  . tiZANidine (ZANAFLEX) 4 MG tablet TAKE 1 TABLET BY MOUTH 3  TIMES DAILY AS NEEDED FOR  MUSCLE SPASM(S) 360 tablet 1  . zolpidem (AMBIEN CR) 6.25 MG CR tablet Take 1 tablet (6.25 mg total) by mouth at bedtime as needed for sleep. 90 tablet 0   No current facility-administered medications for this visit.    OBJECTIVE: There were no vitals filed for this visit.   There is no height or weight on file to calculate BMI.    ECOG FS:0 - Asymptomatic  General:  Well-developed, well-nourished, no acute distress. HEENT: Normocephalic. Neuro: Alert, answering all questions appropriately. Cranial nerves grossly intact. Psych: Normal affect.   LAB RESULTS:  Lab Results  Component Value Date   NA 142 10/06/2020   K 4.2 10/06/2020   CL 102 10/06/2020   CO2 24 10/06/2020   GLUCOSE 134 (H) 10/06/2020   BUN 14 10/06/2020   CREATININE 0.85 10/06/2020   CALCIUM 9.5 10/06/2020   PROT 6.6 10/06/2020   ALBUMIN 4.3 10/06/2020   AST 26 10/06/2020   ALT 49 (H) 10/06/2020   ALKPHOS 79 10/06/2020   BILITOT 0.4 10/06/2020   GFRNONAA 93 10/06/2020   GFRAA 108 10/06/2020    Lab Results  Component Value Date   WBC 14.7 (H) 10/06/2020   NEUTROABS 10.0 (H) 10/06/2020   HGB 16.8 10/06/2020   HCT 49.4 10/06/2020   MCV 90 10/06/2020   PLT 253 10/06/2020     STUDIES: No results found.  ASSESSMENT: Leukocytosis.  PLAN:  1.  Leukocytosis: Likely reactive. Labs completed at [facility] on October 19, 2020 revealed a white blood cell count of 13.4 all of his other laboratory work including iron stores, B12, folate, BCR-ABL mutation, and peripheral blood flow cytometry were either negative or within normal limits. Pathologist review suggested findings can be seen in acute/chronic infection or an inflammatory state, indicating this possibly may be related to his chronic sinus congestion/infection. No intervention is needed at this time. Patient does not require bone marrow biopsy. No further follow-up has been scheduled. Please refer patient back if there are any questions or concerns. 2.  Hypertension: Continue monitoring and treatment per primary care.  I provided 20 minutes of face-to-face video visit time during this encounter which included chart review, counseling, and coordination of care as documented above.   Patient expressed understanding and was in agreement with this plan. He also understands that He can call clinic at any time with any  questions, concerns, or complaints.     Lloyd Huger, MD   11/10/2020 3:38 PM

## 2020-11-10 ENCOUNTER — Inpatient Hospital Stay (HOSPITAL_BASED_OUTPATIENT_CLINIC_OR_DEPARTMENT_OTHER): Payer: Managed Care, Other (non HMO) | Admitting: Oncology

## 2020-11-10 ENCOUNTER — Encounter: Payer: Self-pay | Admitting: Oncology

## 2020-11-10 DIAGNOSIS — D72829 Elevated white blood cell count, unspecified: Secondary | ICD-10-CM | POA: Diagnosis not present

## 2020-12-17 ENCOUNTER — Other Ambulatory Visit: Payer: Self-pay | Admitting: Family Medicine

## 2020-12-17 DIAGNOSIS — E782 Mixed hyperlipidemia: Secondary | ICD-10-CM

## 2020-12-17 DIAGNOSIS — I10 Essential (primary) hypertension: Secondary | ICD-10-CM

## 2021-01-18 NOTE — Progress Notes (Unsigned)
Name: Carl Burke   MRN: [id number]    DOB: 05-10-1956   Date:01/19/2021       Progress Note  Subjective  Chief Complaint  Annual Exam and follow up  HPI  Patient presents for annual CPE    IPSS Questionnaire (AUA-7): Over the past month.   1)  How often have you had a sensation of not emptying your bladder completely after you finish urinating?  0- Not at all  2)  How often have you had to urinate again less than two hours after you finished urinating? 1- Less than one in five  3)  How often have you found you stopped and started again several times when you urinated?  0- Not at all  4) How difficult have you found it to postpone urination?  0- Not at all  5) How often have you had a weak urinary stream?  0- Not at all  6) How often have you had to push or strain to begin urination?  0- Not at all  7) How many times did you most typically get up to urinate from the time you went to bed until the time you got up in the morning?  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, does construction work   Depression: phq 9 is negative Depression screen [facility] 2/9 01/19/2021 09/30/2020 04/10/2020 10/30/2019 07/04/2019  Decreased Interest 2 0 0 0 0  Down, Depressed, Hopeless 2 0 0 0 0  PHQ - 2 Score 4 0 0 0 0  Altered sleeping 1 - 0 0 0  Tired, decreased energy 0 - 0 0 0  Change in appetite 1 - 0 0 0  Feeling bad or failure about yourself  0 - 0 0 0  Trouble concentrating 1 - 0 0 0  Moving slowly or fidgety/restless 0 - 0 0 0  Suicidal thoughts 0 - 0 0 0  PHQ-9 Score 7 - 0 0 0  Difficult doing work/chores Somewhat difficult - Not difficult at all Not difficult at all Not difficult at all   He states during the winter he is in more pain, dark days, cannot work due to pain on hands  He states it is seasonal and refuses medication.   Hypertension:  BP Readings from  Last 3 Encounters:  01/19/21 140/72  10/19/20 (!) 185/70  09/30/20 136/60    Obesity: Wt Readings from Last 3 Encounters:  01/19/21 284 lb 14.4 oz (129.2 kg)  10/19/20 276 lb 9.6 oz (125.5 kg)  09/30/20 274 lb 6.4 oz (124.5 kg)   BMI Readings from Last 3 Encounters:  01/19/21 38.64 kg/m  10/19/20 37.51 kg/m  09/30/20 37.22 kg/m     Lipids:  Lab Results  Component Value Date   CHOL 203 (H) 10/06/2020   CHOL 158 10/30/2019   CHOL 186 01/03/2019   Lab Results  Component Value Date   HDL 40 10/06/2020   HDL 35 (L) 10/30/2019   HDL 36 (L) 01/03/2019   Lab Results  Component Value Date   LDLCALC 110 (H) 10/06/2020   LDLCALC 89 10/30/2019   LDLCALC 93 01/03/2019   Lab Results  Component Value Date   TRIG 309 (H) 10/06/2020   TRIG 245 (H) 10/30/2019   TRIG 287 (H) 01/03/2019   Lab Results  Component Value Date   CHOLHDL 5.1 (  H) 10/06/2020   CHOLHDL 4.5 10/30/2019   CHOLHDL 5.2 (H) 01/03/2019   No results found for: LDLDIRECT Glucose:  Glucose  Date Value Ref Range Status  10/06/2020 134 (H) 65 - 99 mg/dL Final  01/03/2019 95 65 - 99 mg/dL Final  07/18/2018 103 (H) 65 - 99 mg/dL Final   Glucose, Bld  Date Value Ref Range Status  10/30/2019 117 (H) 65 - 99 mg/dL Final    Comment:    .            Fasting reference interval . For someone without known diabetes, a glucose value between 100 and 125 mg/dL is consistent with prediabetes and should be confirmed with a follow-up test. .   11/09/2016 136 (H) 65 - 99 mg/dL Final  07/17/2016 108 (H) 65 - 99 mg/dL Final    [place] Office Visit from 01/19/2021 in [facility]  AUDIT-C Score 2       Married STD testing and prevention (HIV/chl/gon/syphilis): not interested  Hep C: 01/03/2019  Skin cancer: Discussed monitoring for atypical lesions Colorectal cancer: 08/23/2018 Prostate cancer: N/A   Lung cancer:   Low Dose CT Chest recommended if Age 29-80 years, 30 pack-year  currently smoking OR have quit w/in 15years. Patient  qualify.   ECG:  07/17/2016  Vaccines:   Shingrix: refused  Pneumonia: refused  Flu: educated and discussed with patient. He refused   Advanced Care Planning: A voluntary discussion about advance care planning including the explanation and discussion of advance directives.  Discussed health care proxy and Living will, and the patient was able to identify a health care proxy as wife .  Patient Active Problem List   Diagnosis Date Noted  . Seasonal affective disorder ([place]) 01/19/2021  . Dyslipidemia 10/31/2019  . Umbilical hernia without obstruction and without gangrene 07/17/2019  . Pre-diabetes 01/07/2019  . Degeneration of lumbosacral intervertebral disc 03/28/2018  . Full thickness rotator cuff tear 03/28/2018  . Neck pain 03/28/2018  . Hyperlipidemia 12/20/2016  . Leukocytosis 07/17/2016  . Chronic shoulder pain (Right) 04/12/2016  . Cervical spondylosis 04/12/2016  . Chronic upper back pain (Location of Secondary source of pain) (Bilateral) (midline) 04/12/2016  . Essential hypertension 04/12/2016  . Lumbosacral radiculopathy at S1 (Left) 04/12/2016  . Rotator cuff arthropathy of right shoulder 08/15/2013    Past Surgical History:  Procedure Laterality Date  . COLONOSCOPY    . COLONOSCOPY WITH PROPOFOL N/A 08/23/2018   Procedure: COLONOSCOPY WITH PROPOFOL;  Surgeon: Lin Landsman, MD;  Location: [facility] ENDOSCOPY;  Service: Gastroenterology;  Laterality: N/A;  . ESOPHAGOGASTRODUODENOSCOPY N/A 07/19/2016   Procedure: ESOPHAGOGASTRODUODENOSCOPY (EGD);  Surgeon: Gatha Mayer, MD;  Location: [facility] ENDOSCOPY;  Service: Endoscopy;  Laterality: N/A;  . ESOPHAGOGASTRODUODENOSCOPY (EGD) WITH PROPOFOL N/A 08/23/2018   Procedure: ESOPHAGOGASTRODUODENOSCOPY (EGD) WITH PROPOFOL;  Surgeon: Lin Landsman, MD;  Location[facility] ENDOSCOPY;  Service: Gastroenterology;  Laterality: N/A;  . ROTATOR CUFF REPAIR Left 2011  . ROTATOR CUFF  REPAIR Right 2013    Family History  Problem Relation Age of Onset  . Heart disease Mother   . Colon cancer Father   . Cancer Sister   . Breast cancer Sister 80  . Cancer Sister     Social History   Socioeconomic History  . Marital status: Married    Spouse name: Blanch Media  . Number of children: 3  . Years of education: Not on file  . Highest education level: Not on file  Occupational History  .  Occupation: retired   Tobacco Use  . Smoking status: Former Smoker    Packs/day: 0.50    Years: 50.00    Pack years: 25.00    Types: Cigarettes    Start date: 09/30/1970    Quit date: 12/30/2020    Years since quitting: 0.0  . Smokeless tobacco: Never Used  . Tobacco comment: quit recently   Vaping Use  . Vaping Use: Never used  Substance and Sexual Activity  . Alcohol use: Yes    Alcohol/week: 0.0 standard drinks    Comment: Rare/occassional   . Drug use: No  . Sexual activity: Yes    Partners: Female  Other Topics Concern  . 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 Difficulty of Paying Living Expenses: Not hard at all  Food Insecurity: No Food Insecurity  . Worried About Charity fundraiser in the Last Year: Never true  . Ran Out of Food in the Last Year: Never true  Transportation Needs: No Transportation Needs  . Lack of Transportation (Medical): No  . Lack of Transportation (Non-Medical): No  Physical Activity: Sufficiently Active  . Days of Exercise per Week: 7 days  . Minutes of Exercise per Session: 60 min  Stress: No Stress Concern Present  . Feeling of Stress : Not at all  Social Connections: Moderately Isolated  . Frequency of Communication with Friends and Family: More than three times a week  . Frequency of Social Gatherings with Friends and Family: Never  .  Attends Religious Services: Never  . Active Member of Clubs or Organizations: No  . Attends Archivist Meetings: Never  . Marital Status: Married  Human resources officer Violence: Not At Risk  . Fear of Current or Ex-Partner: No  . Emotionally Abused: No  . Physically Abused: No  . Sexually Abused: No     Current Outpatient Medications:  .  amLODipine-benazepril (LOTREL) 5-20 MG capsule, TAKE 1 CAPSULE BY MOUTH  DAILY, Disp: 90 capsule, Rfl: 0 .  atorvastatin (LIPITOR) 40 MG tablet, TAKE 1 TABLET BY MOUTH  DAILY, Disp: 90 tablet, Rfl: 0 .  ibuprofen (ADVIL,MOTRIN) 200 MG tablet, Take 1 tablet (200 mg total) by mouth 2 (two) times daily., Disp: 60 tablet, Rfl: 0 .  loratadine (CLARITIN) 10 MG tablet, Take 1 tablet (10 mg total) by mouth 2 (two) times daily., Disp: 180 tablet, Rfl: 1 .  tiZANidine (ZANAFLEX) 4 MG tablet, TAKE 1 TABLET BY MOUTH 3  TIMES DAILY AS NEEDED FOR  MUSCLE SPASM(S), Disp: 360 tablet, Rfl: 1 .  zolpidem (AMBIEN CR) 6.25 MG CR tablet, Take 1 tablet (6.25 mg total) by mouth at bedtime as needed for sleep., Disp: 90 tablet, Rfl: 0  Allergies  Allergen Reactions  . Naproxen Sodium Rash and Other (See Comments)    800 mg tablets that the MD prescribed for pain.     ROS  Constitutional: Negative for fever , positive for weight change.  Respiratory: Negative for cough and shortness of breath.   Cardiovascular: Negative for chest pain or palpitations.  Gastrointestinal: Negative for abdominal pain, no bowel changes.  Musculoskeletal: positive for gait problem and  joint swelling.  Skin: Negative for rash.  Neurological: Negative for dizziness or headache.  No other  specific complaints in a complete review of systems (except as listed in HPI above).   Objective  Vitals:   01/19/21 0859  BP: 140/72  Pulse: 90  Resp: 16  Temp: 97.9 F (36.6 C)  TempSrc: Oral  SpO2: 97%  Weight: 284 lb 14.4 oz (129.2 kg)  Height: 6' (1.829 m)    Body mass index is  38.64 kg/m.  Physical Exam  Constitutional: Patient appears well-developed and well-nourished. No distress.  HENT: Head: Normocephalic and atraumatic. Ears: B TMs ok, no erythema or effusion; Nose: Nose normal. Mouth/Throat: Oropharynx is clear and moist. No oropharyngeal exudate.  Eyes: Conjunctivae and EOM are normal. Pupils are equal, round, and reactive to light. No scleral icterus.  Neck: Normal range of motion. Neck supple. No JVD present. No thyromegaly present.  Cardiovascular: Normal rate, regular rhythm and normal heart sounds.  No murmur heard. No BLE edema. Pulmonary/Chest: Effort normal and breath sounds normal. No respiratory distress. Abdominal: Soft. Bowel sounds are normal, no distension. Umbilical hernia and diathesis recti  MALE GENITALIA: Normal descended testes bilaterally, no masses palpated, no hernias, no lesions, no discharge RECTAL: not done  Musculoskeletal: he has pain during palpation of lumbar spine, worse on right lower back with symptoms of radiculitis, hands are stiff and has some swelling but no redness or increase in warmth  Neurological: he is alert and oriented to person, place, and time. No cranial nerve deficit. Coordination, balance, strength, speech and gait are normal.  Skin: Skin is warm and dry. No rash noted. No erythema.  Psychiatric: Patient has a normal mood and affect. behavior is normal. Judgment and thought content normal.  Fall Risk: Fall Risk  01/19/2021 09/30/2020 04/10/2020 10/30/2019 07/17/2019  Falls in the past year? 1 0 0 0 0  Number falls in past yr: 1 0 0 0 -  Comment 3 - - - -  Injury with Fall? 0 0 0 0 -  Risk for fall due to : History of fall(s) - - - -  Follow up Falls prevention discussed - - - -  Comment fell on ice - - - -      Functional Status Survey: Is the patient deaf or have difficulty hearing?: Yes Does the patient have difficulty seeing, even when wearing glasses/contacts?: No Does the patient have difficulty  concentrating, remembering, or making decisions?: Yes (Sometimes) Does the patient have difficulty walking or climbing stairs?: Yes Does the patient have difficulty dressing or bathing?: No Does the patient have difficulty doing errands alone such as visiting a doctor's office or shopping?: No    Assessment & Plan   1. Well adult exam   2. Need for shingles vaccine  Refused   3. Encounter for screening for lung cancer  Refused   4. Seasonal affective disorder [facility])  Not interested in discussing it or getting treated for it   -Prostate cancer screening and PSA options (with potential risks and benefits of testing vs not testing) were discussed along with recent recs/guidelines. -USPSTF grade A and B recommendations reviewed with patient; age-appropriate recommendations, preventive care, screening tests, etc discussed and encouraged; healthy living encouraged; see AVS for patient education given to patient -Discussed importance of 150 minutes of physical activity weekly, eat two servings of fish weekly, eat one serving of tree nuts ( cashews, pistachios, pecans, almonds.Marland Kitchen) every other day, eat 6 servings of fruit/vegetables daily and drink plenty of water and avoid sweet beverages.

## 2021-01-18 NOTE — Patient Instructions (Signed)

## 2021-01-19 ENCOUNTER — Encounter: Payer: Self-pay | Admitting: Family Medicine

## 2021-01-19 ENCOUNTER — Ambulatory Visit (INDEPENDENT_AMBULATORY_CARE_PROVIDER_SITE_OTHER): Payer: Managed Care, Other (non HMO) | Admitting: Family Medicine

## 2021-01-19 ENCOUNTER — Other Ambulatory Visit: Payer: Self-pay

## 2021-01-19 VITALS — BP 140/72 | HR 90 | Temp 97.9°F | Resp 16 | Ht 72.0 in | Wt 284.9 lb

## 2021-01-19 DIAGNOSIS — Z122 Encounter for screening for malignant neoplasm of respiratory organs: Secondary | ICD-10-CM

## 2021-01-19 DIAGNOSIS — Z23 Encounter for immunization: Secondary | ICD-10-CM

## 2021-01-19 DIAGNOSIS — Z0000 Encounter for general adult medical examination without abnormal findings: Secondary | ICD-10-CM

## 2021-01-19 DIAGNOSIS — F338 Other recurrent depressive disorders: Secondary | ICD-10-CM | POA: Diagnosis not present

## 2021-01-19 HISTORY — DX: Other recurrent depressive disorders: F33.8

## 2021-02-24 ENCOUNTER — Ambulatory Visit: Payer: Managed Care, Other (non HMO) | Admitting: Dermatology

## 2021-03-09 ENCOUNTER — Other Ambulatory Visit: Payer: Self-pay | Admitting: Family Medicine

## 2021-03-09 DIAGNOSIS — I10 Essential (primary) hypertension: Secondary | ICD-10-CM

## 2021-03-09 DIAGNOSIS — E782 Mixed hyperlipidemia: Secondary | ICD-10-CM

## 2021-03-09 NOTE — Telephone Encounter (Signed)
Requested Prescriptions  Pending Prescriptions Disp Refills  . amLODipine-benazepril (LOTREL) 5-20 MG capsule [Pharmacy Med Name: amLODIPine Besy-Benazepril HCl 5-20 MG Oral Capsule] 90 capsule 1    Sig: TAKE 1 CAPSULE BY MOUTH  DAILY     Cardiovascular: CCB + ACEI Combos Failed - 03/09/2021  9:23 PM      Failed - Last BP in normal range    BP Readings from Last 1 Encounters:  01/19/21 140/72         Passed - Cr in normal range and within 180 days    Creat  Date Value Ref Range Status  10/30/2019 0.65 (L) 0.70 - 1.25 mg/dL Final    Comment:    For patients >52 years of age, the reference limit for Creatinine is approximately 13% higher for people identified as African-American. .    Creatinine, Ser  Date Value Ref Range Status  10/06/2020 0.85 0.76 - 1.27 mg/dL Final         Passed - K in normal range and within 180 days    Potassium  Date Value Ref Range Status  10/06/2020 4.2 3.5 - 5.2 mmol/L Final         Passed - Patient is not pregnant      Passed - Valid encounter within last 6 months    Recent Outpatient Visits          1 month ago Well adult exam   [facility] Steele Sizer, MD   5 months ago Simple chronic bronchitis [facility])   [facility] Steele Sizer, MD   11 months ago Morbid obesity [facility])   [facility] Steele Sizer, MD   1 year ago Essential hypertension   [facility] Steele Sizer, MD   1 year ago Umbilical hernia without obstruction and without gangrene   [facility] Steele Sizer, MD      Future Appointments            In 3 weeks Steele Sizer, MD [facility], [place]   In 3 months Ralene Bathe, MD [place]           . atorvastatin (LIPITOR) 40 MG tablet [Pharmacy Med Name: Atorvastatin Calcium 40 MG Oral Tablet] 90 tablet 3    Sig: TAKE 1 TABLET BY MOUTH  DAILY     Cardiovascular:  Antilipid -  Statins Failed - 03/09/2021  9:23 PM      Failed - Total Cholesterol in normal range and within 360 days    Cholesterol, Total  Date Value Ref Range Status  10/06/2020 203 (H) 100 - 199 mg/dL Final         Failed - LDL in normal range and within 360 days    LDL Cholesterol (Calc)  Date Value Ref Range Status  10/30/2019 89 mg/dL (calc) Final    Comment:    Reference range: <100 . Desirable range <100 mg/dL for primary prevention;   <70 mg/dL for patients with CHD or diabetic patients  with > or = 2 CHD risk factors. Marland Kitchen LDL-C is now calculated using the Martin-Hopkins  calculation, which is a validated novel method providing  better accuracy than the Friedewald equation in the  estimation of LDL-C.  Cresenciano Genre et al. Annamaria Helling. 5916;384(66): 2061-2068  (http://education.[facility].com/faq/FAQ164)    LDL Chol Calc (NIH)  Date Value Ref Range Status  10/06/2020 110 (H) 0 - 99 mg/dL Final  Failed - Triglycerides in normal range and within 360 days    Triglycerides  Date Value Ref Range Status  10/06/2020 309 (H) 0 - 149 mg/dL Final         Passed - HDL in normal range and within 360 days    HDL  Date Value Ref Range Status  10/06/2020 40 >39 mg/dL Final         Passed - Patient is not pregnant      Passed - Valid encounter within last 12 months    Recent Outpatient Visits          1 month ago Well adult exam   [facility] Steele Sizer, MD   5 months ago Simple chronic bronchitis [facility])   [facility] Steele Sizer, MD   11 months ago Morbid obesity [facility])   [facility] Steele Sizer, MD   1 year ago Essential hypertension   [facility] Steele Sizer, MD   1 year ago Umbilical hernia without obstruction and without gangrene   [facility] Steele Sizer, MD      Future Appointments            In 3 weeks Steele Sizer, MD [facility], [place]   In 3 months Ralene Bathe, MD [place]

## 2021-04-01 ENCOUNTER — Ambulatory Visit: Payer: Managed Care, Other (non HMO) | Admitting: Family Medicine

## 2021-04-26 ENCOUNTER — Ambulatory Visit: Payer: Managed Care, Other (non HMO) | Admitting: Family Medicine

## 2021-05-08 LAB — COMPREHENSIVE METABOLIC PANEL
ALT: 36 IU/L (ref 0–44)
AST: 20 IU/L (ref 0–40)
Albumin/Globulin Ratio: 1.9 (ref 1.2–2.2)
Albumin: 4.2 g/dL (ref 3.8–4.8)
Alkaline Phosphatase: 80 IU/L (ref 44–121)
BUN/Creatinine Ratio: 12 (ref 10–24)
BUN: 8 mg/dL (ref 8–27)
Bilirubin Total: 0.2 mg/dL (ref 0.0–1.2)
CO2: 27 mmol/L (ref 20–29)
Calcium: 9.4 mg/dL (ref 8.6–10.2)
Chloride: 102 mmol/L (ref 96–106)
Creatinine, Ser: 0.69 mg/dL — ABNORMAL LOW (ref 0.76–1.27)
Globulin, Total: 2.2 g/dL (ref 1.5–4.5)
Glucose: 103 mg/dL — ABNORMAL HIGH (ref 65–99)
Potassium: 4.9 mmol/L (ref 3.5–5.2)
Sodium: 143 mmol/L (ref 134–144)
Total Protein: 6.4 g/dL (ref 6.0–8.5)
eGFR: 104 mL/min/{1.73_m2} (ref >59)

## 2021-05-08 LAB — LIPID PANEL
Chol/HDL Ratio: 4.5 ratio (ref 0.0–5.0)
Cholesterol, Total: 156 mg/dL (ref 100–199)
HDL: 35 mg/dL — ABNORMAL LOW (ref >39)
LDL Chol Calc (NIH): 87 mg/dL (ref 0–99)
Triglycerides: 200 mg/dL — ABNORMAL HIGH (ref 0–149)
VLDL Cholesterol Cal: 34 mg/dL (ref 5–40)

## 2021-05-08 LAB — CBC WITH DIFFERENTIAL/PLATELET
Basophils Absolute: 0.1 10*3/uL (ref 0.0–0.2)
Basos: 1 %
EOS (ABSOLUTE): 0.5 10*3/uL — ABNORMAL HIGH (ref 0.0–0.4)
Eos: 5 %
Hematocrit: 49.9 % (ref 37.5–51.0)
Hemoglobin: 16.7 g/dL (ref 13.0–17.7)
Immature Grans (Abs): 0 10*3/uL (ref 0.0–0.1)
Immature Granulocytes: 0 %
Lymphocytes Absolute: 3.2 10*3/uL — ABNORMAL HIGH (ref 0.7–3.1)
Lymphs: 27 %
MCH: 30.6 pg (ref 26.6–33.0)
MCHC: 33.5 g/dL (ref 31.5–35.7)
MCV: 92 fL (ref 79–97)
Monocytes Absolute: 0.8 10*3/uL (ref 0.1–0.9)
Monocytes: 7 %
Neutrophils Absolute: 7.1 10*3/uL — ABNORMAL HIGH (ref 1.4–7.0)
Neutrophils: 60 %
Platelets: 250 10*3/uL (ref 150–450)
RBC: 5.45 x10E6/uL (ref 4.14–5.80)
RDW: 13.5 % (ref 11.6–15.4)
WBC: 11.7 10*3/uL — ABNORMAL HIGH (ref 3.4–10.8)

## 2021-05-08 LAB — HEMOGLOBIN A1C
Est. average glucose Bld gHb Est-mCnc: 140 mg/dL
Hgb A1c MFr Bld: 6.5 % — ABNORMAL HIGH (ref 4.8–5.6)

## 2021-06-28 NOTE — Progress Notes (Signed)
Name: Carl Burke   MRN: [id number]    DOB: Jan 31, 1956   Date:06/29/2021       Progress Note  Subjective  Chief Complaint  Follow Up  HPI  Breast mass: left nipple was tender, we checked mammogram back in 10/2019 and was clear. He states it comes and goes, no pain at this time . Unchanged and states the frequency of changes has decreased No problems now    Chronic bronchitis: he has been smoking since age 65, he states initially just a few cigarettes, in his 48's and 53 's he smoked up to 3 packs per day. He quit  smoking again a couple of weeks. He has a daily cough, productive in am's, occasionally has sob but denies wheezing, discussed CT lung screening, he forgot to discuss it with his wife. 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 Unchanged   HTN: he denies chest pain or palpitation, taking medication daily. BP improved since last visit. Likely from change in diet and weight loss   Leucocytosis: it goes up and down, discussed it may be secondary to smoking, he has seen hematologist . Last level has improved , almost back to normal now    Hyperglycemia: he denies polyphagia, polydipsia or polyuria. Last A1C is stable at 6.5% , explained level is now in the diabetes level , he had a fasting of 134 last year, he has changed his diet, drinking a protein shake in the mornings and lost 11 lbs. We will recheck level next visit and if still high we will change diagnosis to DM   Chronic back pain, neck pain, knee pain radiculitis/also hands: used to take narcotics , seen by pain clinic, but he states medications did not help so he stopped everything , only on Zanaflex three times a day. Pain right now is 6/10 . He has intermittent tingling on left thumb and index finger    Dyslipidemia: taking Atorvastatin  but off  Vascepa because of cost, LDL improved from 110 to 87 on medication, good  cholesterl was low but he is eating healthier and we can monitor for now    Morbid obesity : BMI above 35 with co-morbidies , such as HTN, chronic back pain, dyslipidemia. He has lost 11 lbs since last visit, he is cutting on carbohydrates at this time., replacing breakfast with a protein shake called [employer] - reviewed online no caffeine, all plant based and some minerals and vitamins     Umbilical hernia: hernia has been present for many years, he saw surgeon, he is wearing a compression vest , unchanged    Insomnia: chronic, takes Ambien every other night and it helps him fall and stay asleep. He gets 90 days but lasts about 5-6 months. He needs refill of medication today . He states it helps with his sleep about half the time, but does not want to change    History of alcoholism: he states used to drink a bottle of hard liquor per night in his 30's and 50 's he had 4 DUI's in his life time, but states now only drinks occasionally. Unchanged     Patient Active Problem List   Diagnosis Date Noted   Seasonal affective disorder ([place]) 01/19/2021   Dyslipidemia 36/46/8032   Umbilical hernia without obstruction and without gangrene 07/17/2019   Pre-diabetes 01/07/2019   Degeneration of  lumbosacral intervertebral disc 03/28/2018   Full thickness rotator cuff tear 03/28/2018   Neck pain 03/28/2018   Hyperlipidemia 12/20/2016   Leukocytosis 07/17/2016   Chronic shoulder pain (Right) 04/12/2016   Cervical spondylosis 04/12/2016   Chronic upper back pain (Location of Secondary source of pain) (Bilateral) (midline) 04/12/2016   Essential hypertension 04/12/2016   Lumbosacral radiculopathy at S1 (Left) 04/12/2016   Rotator cuff arthropathy of right shoulder 08/15/2013    Past Surgical History:  Procedure Laterality Date   COLONOSCOPY     COLONOSCOPY WITH PROPOFOL N/A 08/23/2018   Procedure: COLONOSCOPY WITH PROPOFOL;  Surgeon: Lin Landsman, MD;  Location: [facility] ENDOSCOPY;  Service:  Gastroenterology;  Laterality: N/A;   ESOPHAGOGASTRODUODENOSCOPY N/A 07/19/2016   Procedure: ESOPHAGOGASTRODUODENOSCOPY (EGD);  Surgeon: Gatha Mayer, MD;  Location: [facility] ENDOSCOPY;  Service: Endoscopy;  Laterality: N/A;   ESOPHAGOGASTRODUODENOSCOPY (EGD) WITH PROPOFOL N/A 08/23/2018   Procedure: ESOPHAGOGASTRODUODENOSCOPY (EGD) WITH PROPOFOL;  Surgeon: Lin Landsman, MD;  Location: [facility] ENDOSCOPY;  Service: Gastroenterology;  Laterality: N/A;   ROTATOR CUFF REPAIR Left 2011   ROTATOR CUFF REPAIR Right 2013    Family History  Problem Relation Age of Onset   Heart disease Mother    Colon cancer Father    Cancer Sister    Breast cancer Sister 21   Cancer Sister     Social History   Tobacco Use   Smoking status: Former    Packs/day: 0.50    Years: 50.00    Pack years: 25.00    Types: Cigarettes    Start date: 09/30/1970    Quit date: 12/30/2020    Years since quitting: 0.4   Smokeless tobacco: Never   Tobacco comments:    quit recently   Substance Use Topics   Alcohol use: Yes    Alcohol/week: 0.0 standard drinks    Comment: Rare/occassional      Current Outpatient Medications:    atorvastatin (LIPITOR) 40 MG tablet, TAKE 1 TABLET BY MOUTH  DAILY, Disp: 90 tablet, Rfl: 3   ibuprofen (ADVIL,MOTRIN) 200 MG tablet, Take 1 tablet (200 mg total) by mouth 2 (two) times daily., Disp: 60 tablet, Rfl: 0   loratadine (CLARITIN) 10 MG tablet, Take 1 tablet (10 mg total) by mouth 2 (two) times daily., Disp: 180 tablet, Rfl: 1   amLODipine-benazepril (LOTREL) 5-20 MG capsule, Take 1 capsule by mouth daily., Disp: 90 capsule, Rfl: 1   tiZANidine (ZANAFLEX) 4 MG tablet, TAKE 1 TABLET BY MOUTH 3  TIMES DAILY AS NEEDED FOR  MUSCLE SPASM(S), Disp: 360 tablet, Rfl: 1   zolpidem (AMBIEN CR) 6.25 MG CR tablet, Take 1 tablet (6.25 mg total) by mouth at bedtime as needed for sleep., Disp: 90 tablet, Rfl: 0  Allergies  Allergen Reactions   Naproxen Sodium Rash and Other (See Comments)    800  mg tablets that the MD prescribed for pain.    I personally reviewed active problem list, medication list, allergies, family history, social history, health maintenance with the patient/caregiver today.   ROS  Constitutional: Negative for fever , positive for  weight change.  Respiratory: Negative for cough and shortness of breath.   Cardiovascular: Negative for chest pain or palpitations.  Gastrointestinal: Negative for abdominal pain, no bowel changes.  Musculoskeletal: positive for gait problem and joint swelling.  Skin: Negative for rash.  Neurological: Negative for dizziness or headache.  No other specific complaints in a complete review of systems (except as listed in HPI above).   Objective  Vitals:   06/29/21 1143  BP: 130/74  Pulse: 80  Resp: 16  Temp: 98.1 F (36.7 C)  TempSrc: Oral  SpO2: 95%  Weight: 274 lb (124.3 kg)  Height: 6' (1.829 m)    Body mass index is 37.16 kg/m.  Physical Exam  Constitutional: Patient appears well-developed and well-nourished. Obese  No distress.  HEENT: head atraumatic, normocephalic, pupils equal and reactive to light, neck supple Cardiovascular: Normal rate, regular rhythm and normal heart sounds.  No murmur heard. No BLE edema. Pulmonary/Chest: Effort normal and breath sounds normal. No respiratory distress. Abdominal: Soft.  There is no tenderness. Muscular Skeletal: tender during palpitation of lumbar spine  Psychiatric: Patient has a normal mood and affect. behavior is normal. Judgment and thought content normal.   Recent Results (from the past 2160 hour(s))  Lipid panel     Status: Abnormal   Collection Time: 05/07/21  9:09 AM  Result Value Ref Range   Cholesterol, Total 156 100 - 199 mg/dL   Triglycerides 200 (H) 0 - 149 mg/dL   HDL 35 (L) >39 mg/dL   VLDL Cholesterol Cal 34 5 - 40 mg/dL   LDL Chol Calc (NIH) 87 0 - 99 mg/dL   Chol/HDL Ratio 4.5 0.0 - 5.0 ratio    Comment:                                   T.  Chol/HDL Ratio                                             Men  Women                               1/2 Avg.Risk  3.4    3.3                                   Avg.Risk  5.0    4.4                                2X Avg.Risk  9.6    7.1                                3X Avg.Risk 23.4   11.0   CBC with Differential/Platelet     Status: Abnormal   Collection Time: 05/07/21  9:09 AM  Result Value Ref Range   WBC 11.7 (H) 3.4 - 10.8 x10E3/uL   RBC 5.45 4.14 - 5.80 x10E6/uL   Hemoglobin 16.7 13.0 - 17.7 g/dL   Hematocrit 49.9 37.5 - 51.0 %   MCV 92 79 - 97 fL   MCH 30.6 26.6 - 33.0 pg   MCHC 33.5 31.5 - 35.7 g/dL   RDW 13.5 11.6 - 15.4 %   Platelets 250 150 - 450 x10E3/uL   Neutrophils 60 Not Estab. %   Lymphs 27 Not Estab. %   Monocytes 7 Not Estab. %   Eos 5 Not Estab. %   Basos 1 Not Estab. %   Neutrophils Absolute 7.1 (H) 1.4 -  7.0 x10E3/uL   Lymphocytes Absolute 3.2 (H) 0.7 - 3.1 x10E3/uL   Monocytes Absolute 0.8 0.1 - 0.9 x10E3/uL   EOS (ABSOLUTE) 0.5 (H) 0.0 - 0.4 x10E3/uL   Basophils Absolute 0.1 0.0 - 0.2 x10E3/uL   Immature Granulocytes 0 Not Estab. %   Immature Grans (Abs) 0.0 0.0 - 0.1 x10E3/uL  Comprehensive metabolic panel     Status: Abnormal   Collection Time: 05/07/21  9:09 AM  Result Value Ref Range   Glucose 103 (H) 65 - 99 mg/dL   BUN 8 8 - 27 mg/dL   Creatinine, Ser 0.69 (L) 0.76 - 1.27 mg/dL   eGFR 104 >59 mL/min/1.73   BUN/Creatinine Ratio 12 10 - 24   Sodium 143 134 - 144 mmol/L   Potassium 4.9 3.5 - 5.2 mmol/L   Chloride 102 96 - 106 mmol/L   CO2 27 20 - 29 mmol/L   Calcium 9.4 8.6 - 10.2 mg/dL   Total Protein 6.4 6.0 - 8.5 g/dL   Albumin 4.2 3.8 - 4.8 g/dL   Globulin, Total 2.2 1.5 - 4.5 g/dL   Albumin/Globulin Ratio 1.9 1.2 - 2.2   Bilirubin Total 0.2 0.0 - 1.2 mg/dL   Alkaline Phosphatase 80 44 - 121 IU/L   AST 20 0 - 40 IU/L   ALT 36 0 - 44 IU/L  Hemoglobin A1c     Status: Abnormal   Collection Time: 05/07/21  9:09 AM  Result Value Ref Range    Hgb A1c MFr Bld 6.5 (H) 4.8 - 5.6 %    Comment:          Prediabetes: 5.7 - 6.4          Diabetes: >6.4          Glycemic control for adults with diabetes: <7.0    Est. average glucose Bld gHb Est-mCnc 140 mg/dL     PHQ2/9: Depression screen [facility] 2/9 06/29/2021 01/19/2021 09/30/2020 04/10/2020 10/30/2019  Decreased Interest 0 2 0 0 0  Down, Depressed, Hopeless 0 2 0 0 0  PHQ - 2 Score 0 4 0 0 0  Altered sleeping - 1 - 0 0  Tired, decreased energy - 0 - 0 0  Change in appetite - 1 - 0 0  Feeling bad or failure about yourself  - 0 - 0 0  Trouble concentrating - 1 - 0 0  Moving slowly or fidgety/restless - 0 - 0 0  Suicidal thoughts - 0 - 0 0  PHQ-9 Score - 7 - 0 0  Difficult doing work/chores - Somewhat difficult - Not difficult at all Not difficult at all    phq 9 is negative   Fall Risk: Fall Risk  06/29/2021 01/19/2021 09/30/2020 04/10/2020 10/30/2019  Falls in the past year? 0 1 0 0 0  Number falls in past yr: 0 1 0 0 0  Comment - 3 - - -  Injury with Fall? 0 0 0 0 0  Risk for fall due to : - History of fall(s) - - -  Follow up - Falls prevention discussed - - -  Comment - fell on ice - - -      Functional Status Survey: Is the patient deaf or have difficulty hearing?: Yes Does the patient have difficulty seeing, even when wearing glasses/contacts?: No Does the patient have difficulty concentrating, remembering, or making decisions?: No Does the patient have difficulty walking or climbing stairs?: Yes Does the patient have difficulty dressing or bathing?: No Does the  patient have difficulty doing errands alone such as visiting a doctor's office or shopping?: No    Assessment & Plan  1. Morbid obesity ([place])  Discussed with the patient the risk posed by an increased BMI. Discussed importance of portion control, calorie counting and at least 150 minutes of physical activity weekly. Avoid sweet beverages and drink more water. Eat at least 6 servings of fruit and  vegetables daily    2. Essential hypertension  - amLODipine-benazepril (LOTREL) 5-20 MG capsule; Take 1 capsule by mouth daily.  Dispense: 90 capsule; Refill: 1  3. Chronic pain syndrome   4. Perennial allergic rhinitis with seasonal variation   5. Lumbosacral radiculopathy at S1   6. Mixed hyperlipidemia  On statin therapy   7. Simple chronic bronchitis ([place])   8. Tobacco use   9. Primary osteoarthritis of both hands  Stable   10. Insomnia, unspecified type  - zolpidem (AMBIEN CR) 6.25 MG CR tablet; Take 1 tablet (6.25 mg total) by mouth at bedtime as needed for sleep.  Dispense: 90 tablet; Refill: 0  11. Back muscle spasm  - tiZANidine (ZANAFLEX) 4 MG tablet; TAKE 1 TABLET BY MOUTH 3  TIMES DAILY AS NEEDED FOR  MUSCLE SPASM(S)  Dispense: 360 tablet; Refill: 1  12. History of alcoholism ([place])  We will continue to monitor

## 2021-06-29 ENCOUNTER — Other Ambulatory Visit: Payer: Self-pay

## 2021-06-29 ENCOUNTER — Ambulatory Visit: Payer: Managed Care, Other (non HMO) | Admitting: Family Medicine

## 2021-06-29 ENCOUNTER — Encounter: Payer: Self-pay | Admitting: Family Medicine

## 2021-06-29 DIAGNOSIS — J3089 Other allergic rhinitis: Secondary | ICD-10-CM

## 2021-06-29 DIAGNOSIS — G4700 Insomnia, unspecified: Secondary | ICD-10-CM

## 2021-06-29 DIAGNOSIS — M19041 Primary osteoarthritis, right hand: Secondary | ICD-10-CM

## 2021-06-29 DIAGNOSIS — M5417 Radiculopathy, lumbosacral region: Secondary | ICD-10-CM

## 2021-06-29 DIAGNOSIS — M62830 Muscle spasm of back: Secondary | ICD-10-CM

## 2021-06-29 DIAGNOSIS — J410 Simple chronic bronchitis: Secondary | ICD-10-CM

## 2021-06-29 DIAGNOSIS — I10 Essential (primary) hypertension: Secondary | ICD-10-CM | POA: Diagnosis not present

## 2021-06-29 DIAGNOSIS — G894 Chronic pain syndrome: Secondary | ICD-10-CM

## 2021-06-29 DIAGNOSIS — J302 Other seasonal allergic rhinitis: Secondary | ICD-10-CM

## 2021-06-29 DIAGNOSIS — E782 Mixed hyperlipidemia: Secondary | ICD-10-CM

## 2021-06-29 DIAGNOSIS — M19042 Primary osteoarthritis, left hand: Secondary | ICD-10-CM

## 2021-06-29 DIAGNOSIS — F1021 Alcohol dependence, in remission: Secondary | ICD-10-CM

## 2021-06-29 DIAGNOSIS — Z720 Tobacco use: Secondary | ICD-10-CM

## 2021-06-29 MED ORDER — TIZANIDINE HCL 4 MG PO TABS: ORAL_TABLET | ORAL | 1 refills | Status: DC

## 2021-06-29 MED ORDER — AMLODIPINE BESY-BENAZEPRIL HCL 5-20 MG PO CAPS: 1.0000 | ORAL_CAPSULE | Freq: Every day | ORAL | 1 refills | Status: DC

## 2021-06-29 MED ORDER — ZOLPIDEM TARTRATE ER 6.25 MG PO TBCR: 6.2500 mg | EXTENDED_RELEASE_TABLET | Freq: Every evening | ORAL | 0 refills | Status: DC | PRN

## 2021-07-01 ENCOUNTER — Ambulatory Visit: Payer: Managed Care, Other (non HMO) | Admitting: Dermatology

## 2021-07-01 ENCOUNTER — Other Ambulatory Visit: Payer: Self-pay

## 2021-07-01 DIAGNOSIS — Z1283 Encounter for screening for malignant neoplasm of skin: Secondary | ICD-10-CM | POA: Diagnosis not present

## 2021-07-01 DIAGNOSIS — D1800 Hemangioma unspecified site: Secondary | ICD-10-CM

## 2021-07-01 DIAGNOSIS — D229 Melanocytic nevi, unspecified: Secondary | ICD-10-CM

## 2021-07-01 DIAGNOSIS — L304 Erythema intertrigo: Secondary | ICD-10-CM

## 2021-07-01 DIAGNOSIS — L918 Other hypertrophic disorders of the skin: Secondary | ICD-10-CM

## 2021-07-01 DIAGNOSIS — L820 Inflamed seborrheic keratosis: Secondary | ICD-10-CM

## 2021-07-01 DIAGNOSIS — L570 Actinic keratosis: Secondary | ICD-10-CM | POA: Diagnosis not present

## 2021-07-01 DIAGNOSIS — L814 Other melanin hyperpigmentation: Secondary | ICD-10-CM

## 2021-07-01 DIAGNOSIS — R238 Other skin changes: Secondary | ICD-10-CM

## 2021-07-01 DIAGNOSIS — L821 Other seborrheic keratosis: Secondary | ICD-10-CM

## 2021-07-01 DIAGNOSIS — L578 Other skin changes due to chronic exposure to nonionizing radiation: Secondary | ICD-10-CM

## 2021-07-01 NOTE — Progress Notes (Signed)
New Patient Visit  Subjective  Carl Burke is a 65 y.o. male who presents for the following: New Patient (Initial Visit) (Patient is here today for fbse. Patient reports no family or personal history of skin cancer. He states he has several spots of concern he would like checked today on back, right dorsal hand, posterior right leg, and face. ).  The following portions of the chart were reviewed this encounter and updated as appropriate:   Tobacco  Allergies  Meds  Problems  Med Hx  Surg Hx  Fam Hx     Review of Systems:  No other skin or systemic complaints except as noted in HPI or Assessment and Plan.  Objective  Well appearing patient in no apparent distress; mood and affect are within normal limits.  A full examination was performed including scalp, head, eyes, ears, nose, lips, neck, chest, axillae, abdomen, back, buttocks, bilateral upper extremities, bilateral lower extremities, hands, feet, fingers, toes, fingernails, and toenails. All findings within normal limits unless otherwise noted below.  left mid back x 1, right medial knee x 1 (2) Erythematous keratotic or waxy stuck-on papule or plaque.   face x 4 (4) Erythematous thin papules/macules with gritty scale.   Left Axilla Erythematous rash  Right Mid Helix 0.7 cm Blanching compressible papule    Assessment & Plan   Lentigines - Scattered tan macules - Due to sun exposure - Benign-appering, observe - Recommend daily broad spectrum sunscreen SPF 30+ to sun-exposed areas, reapply every 2 hours as needed. - Call for any changes  Acrochordons (Skin Tags) - Fleshy, skin-colored pedunculated papules at right axilla  - Benign appearing.  - Observe. - If desired, they can be removed with an in office procedure that is not covered by insurance. - Please call the clinic if you notice any new or changing lesions.  Seborrheic Keratoses - Stuck-on, waxy, tan-brown papules and/or plaques  -  Benign-appearing - Discussed benign etiology and prognosis. - Observe - Call for any changes  Melanocytic Nevi - Tan-brown and/or pink-flesh-colored symmetric macules and papules - Benign appearing on exam today - Observation - Call clinic for new or changing moles - Recommend daily use of broad spectrum spf 30+ sunscreen to sun-exposed areas.   Hemangiomas - Red papules - Discussed benign nature - Observe - Call for any changes  Actinic Damage - Chronic condition, secondary to cumulative UV/sun exposure - diffuse scaly erythematous macules with underlying dyspigmentation - Recommend daily broad spectrum sunscreen SPF 30+ to sun-exposed areas, reapply every 2 hours as needed.  - Staying in the shade or wearing long sleeves, sun glasses (UVA+UVB protection) and wide brim hats (4-inch brim around the entire circumference of the hat) are also recommended for sun protection.  - Call for new or changing lesions.  Skin cancer screening performed today.  Inflamed seborrheic keratosis left mid back x 1, right medial knee x 1 Prior to procedure, discussed risks of blister formation, small wound, skin dyspigmentation, or rare scar following cryotherapy. Recommend Vaseline ointment to treated areas while healing.  Destruction of lesion - left mid back x 1, right medial knee x 1 Complexity: simple   Destruction method: cryotherapy   Informed consent: discussed and consent obtained   Timeout:  patient name, date of birth, surgical site, and procedure verified Lesion destroyed using liquid nitrogen: Yes   Region frozen until ice ball extended beyond lesion: Yes   Outcome: patient tolerated procedure well with no complications   Post-procedure details: wound care  instructions given    Actinic keratosis (4) face x 4 Actinic keratoses are precancerous spots that appear secondary to cumulative UV radiation exposure/sun exposure over time. They are chronic with expected duration over 1 year.  A portion of actinic keratoses Carl progress to squamous cell carcinoma of the skin. It is not possible to reliably predict which spots Carl progress to skin cancer and so treatment is recommended to prevent development of skin cancer.  Recommend daily broad spectrum sunscreen SPF 30+ to sun-exposed areas, reapply every 2 hours as needed.  Recommend staying in the shade or wearing long sleeves, sun glasses (UVA+UVB protection) and wide brim hats (4-inch brim around the entire circumference of the hat). Call for new or changing lesions.  Destruction of lesion - face x 4 Complexity: simple   Destruction method: cryotherapy   Informed consent: discussed and consent obtained   Timeout:  patient name, date of birth, surgical site, and procedure verified Lesion destroyed using liquid nitrogen: Yes   Region frozen until ice ball extended beyond lesion: Yes   Outcome: patient tolerated procedure well with no complications   Post-procedure details: wound care instructions given    Erythema intertrigo Left Axilla Discussed treatment with patient  Patient deferred treatment at this time. Intertrigo is a chronic recurrent rash that occurs in skin fold areas that may be associated with friction; heat; moisture; yeast; fungus; and bacteria.  It is exacerbated by increased movement / activity; sweating; and higher atmospheric temperature.  Venous [place] Right Mid Helix Benign, observe.   Skin cancer screening  Return in about 1 year (around 07/01/2022) for tbse ak follow up. IRuthell Rummage, CMA, am acting as scribe for Sarina Ser, MD.  Documentation: I have reviewed the above documentation for accuracy and completeness, and I agree with the above.  Sarina Ser, MD

## 2021-07-01 NOTE — Patient Instructions (Addendum)
Actinic keratoses are precancerous spots that appear secondary to cumulative UV radiation exposure/sun exposure over time. They are chronic with expected duration over 1 year. A portion of actinic keratoses will progress to squamous cell carcinoma of the skin. It is not possible to reliably predict which spots will progress to skin cancer and so treatment is recommended to prevent development of skin cancer.  Recommend daily broad spectrum sunscreen SPF 30+ to sun-exposed areas, reapply every 2 hours as needed.  Recommend staying in the shade or wearing long sleeves, sun glasses (UVA+UVB protection) and wide brim hats (4-inch brim around the entire circumference of the hat). Call for new or changing lesions.   Cryotherapy Aftercare  Wash gently with soap and water everyday.   Apply Vaseline and Band-Aid daily until healed.   Seborrheic Keratosis  What causes seborrheic keratoses? Seborrheic keratoses are harmless, common skin growths that first appear during adult life.  As time goes by, more growths appear.  Some people may develop a large number of them.  Seborrheic keratoses appear on both covered and uncovered body parts.  They are not caused by sunlight.  The tendency to develop seborrheic keratoses can be inherited.  They vary in color from skin-colored to gray, brown, or even black.  They can be either smooth or have a rough, warty surface.   Seborrheic keratoses are superficial and look as if they were stuck on the skin.  Under the microscope this type of keratosis looks like layers upon layers of skin.  That is why at times the top layer may seem to fall off, but the rest of the growth remains and re-grows.    Treatment Seborrheic keratoses do not need to be treated, but can easily be removed in the office.  Seborrheic keratoses often cause symptoms when they rub on clothing or jewelry.  Lesions can be in the way of shaving.  If they become inflamed, they can cause itching, soreness, or  burning.  Removal of a seborrheic keratosis can be accomplished by freezing, burning, or surgery. If any spot bleeds, scabs, or grows rapidly, please return to have it checked, as these can be an indication of a skin cancer.     Melanoma ABCDEs  Melanoma is the most dangerous type of skin cancer, and is the leading cause of death from skin disease.  You are more likely to develop melanoma if you: Have light-colored skin, light-colored eyes, or red or blond hair Spend a lot of time in the sun Tan regularly, either outdoors or in a tanning bed Have had blistering sunburns, especially during childhood Have a close family member who has had a melanoma Have atypical moles or large birthmarks  Early detection of melanoma is key since treatment is typically straightforward and cure rates are extremely high if we catch it early.   The first sign of melanoma is often a change in a mole or a new dark spot.  The ABCDE system is a way of remembering the signs of melanoma.  A for asymmetry:  The two halves do not match. B for border:  The edges of the growth are irregular. C for color:  A mixture of colors are present instead of an even brown color. D for diameter:  Melanomas are usually (but not always) greater than 88mm - the size of a pencil eraser. E for evolution:  The spot keeps changing in size, shape, and color.  Please check your skin once per month between visits. You can use  a small mirror in front and a large mirror behind you to keep an eye on the back side or your body.   If you see any new or changing lesions before your next follow-up, please call to schedule a visit.  Please continue daily skin protection including broad spectrum sunscreen SPF 30+ to sun-exposed areas, reapplying every 2 hours as needed when you're outdoors.   Staying in the shade or wearing long sleeves, sun glasses (UVA+UVB protection) and wide brim hats (4-inch brim around the entire circumference of the hat)  are also recommended for sun protection.    If you have any questions or concerns for your doctor, please call our main line at [phone] and press option 4 to reach your doctor's medical assistant. If no one answers, please leave a voicemail as directed and we will return your call as soon as possible. Messages left after 4 pm will be answered the following business day.   You may also send [place] a message via [place]. We typically respond to MyChart messages within 1-2 business days.  For prescription refills, please ask your pharmacy to contact our office. Our fax number is [phone].  If you have an urgent issue when the clinic is closed that cannot wait until the next business day, you can page your doctor at the number below.    Please note that while we do our best to be available for urgent issues outside of office hours, we are not available 24/7.   If you have an urgent issue and are unable to reach [place], you may choose to seek medical care at your doctor's office, retail clinic, urgent care center, or emergency room.  If you have a medical emergency, please immediately call 911 or go to the emergency department.  Pager Numbers  - Dr. Nehemiah Massed: [phone]  - Dr. Laurence Ferrari: [phone]  - Dr. Nicole Kindred: [phone]  In the event of inclement weather, please call our main line at [phone] for an update on the status of any delays or closures.  Dermatology Medication Tips: Please keep the boxes that topical medications come in in order to help keep track of the instructions about where and how to use these. Pharmacies typically print the medication instructions only on the boxes and not directly on the medication tubes.   If your medication is too expensive, please contact our office at [phone] option 4 or send [place] a message through [place].   We are unable to tell what your co-pay for medications will be in advance as this is different depending on your insurance  coverage. However, we may be able to find a substitute medication at lower cost or fill out paperwork to get insurance to cover a needed medication.   If a prior authorization is required to get your medication covered by your insurance company, please allow [place] 1-2 business days to complete this process.  Drug prices often vary depending on where the prescription is filled and some pharmacies may offer cheaper prices.  The website www.goodrx.com contains coupons for medications through different pharmacies. The prices here do not account for what the cost may be with help from insurance (it may be cheaper with your insurance), but the website can give you the price if you did not use any insurance.  - You can print the associated coupon and take it with your prescription to the pharmacy.  - You may also stop by our office during regular business hours and pick up a GoodRx coupon card.  -  If you need your prescription sent electronically to a different pharmacy, notify our office through [facility] MyChart or by phone at [phone] option 4.  

## 2021-07-03 ENCOUNTER — Encounter: Payer: Self-pay | Admitting: Dermatology

## 2021-07-28 ENCOUNTER — Telehealth: Payer: Self-pay

## 2021-07-28 NOTE — Telephone Encounter (Signed)
Received paperwork and completed/faxed

## 2021-07-28 NOTE — Telephone Encounter (Signed)
Copied from [place] [phone]. Topic: General - Other >> Jul 28, 2021  9:28 AM Alanda Slim E wrote: Reason for CRM: wife will be faxing over a wellness form that needs to be completed by 8.27.22 for insurance/employer // please advise when completed and if anything else is needed

## 2021-08-05 ENCOUNTER — Ambulatory Visit: Payer: Managed Care, Other (non HMO)

## 2021-12-30 NOTE — Progress Notes (Signed)
Name: Carl Burke   MRN: [id number]    DOB: February 13, 1956   Date:12/31/2021       Progress Note  Subjective  Chief Complaint  Follow Up  HPI  Breast mass: left nipple was tender, we checked mammogram back in 10/2019 and was clear, he states no longer hurting .    Chronic bronchitis: he has been smoking since age 66, he states initially just a few cigarettes, in his 74's and 34 's he smoked up to 3 packs per day. He quit  smoking again July 2022  He has a daily cough, productive in am's, occasionally has sob but denies wheezing. Discussed lung cancer screen again.   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 Unchanged   HTN: he denies chest pain or palpitation, taking medication daily. BP is at goal today.   Leucocytosis: it goes up and down, discussed it may be secondary to smoking, he has seen hematologist . Last level has improved , unchanged    Hyperglycemia: he denies polyphagia, polydipsia or polyuria. Last A1C was  6.5% discussed diagnosis of DM, he states he changed his eating habits. He has a high protein diet, and he has lost 9 lbs since last year    Chronic back pain, neck pain, knee pain radiculitis/also hands: used to take narcotics , seen by pain clinic, but he states medications did not help so he stopped everything , only on Zanaflex three times a day. Pain right now is 5/10 . He has intermittent tingling on both thumbs and index finger    Dyslipidemia: taking Atorvastatin  but off  Vascepa because of cost, LDL improved from 110 to 87 on medication, we will recheck labs next visit    Morbid obesity : BMI above 35 with co-morbidies , such as HTN, chronic back pain, dyslipidemia. He has lost 9 lbs since last year, he is cutting on carbohydrates at this time., replacing breakfast with a protein shake called [employer] - reviewed online no caffeine, all plant based and some minerals and vitamins      Umbilical hernia: hernia has been present for many years, he saw surgeon, he is wearing a compression vest . Unchanged    Insomnia: chronic, takes Ambien every other night and it helps him fall and stay asleep. He gets 90 days but lasts about 5-6 months. He needs refill of medication today . He states it helps with his sleep about half the time, but does not want to change    History of alcoholism: he states used to drink a bottle of hard liquor per night in his 30's and 15 's he had 4 DUI's in his life time, but states now only drinks occasionally, he had one glass of wine last night. No changes   Perennial AR: he is taking loratadine, he states he has a lot of post-nasal drainage, we will try adding flonase today   Patient Active Problem List   Diagnosis Date Noted   Seasonal affective disorder ([place]) 01/19/2021   Dyslipidemia 51/88/4166   Umbilical hernia without obstruction and without gangrene 07/17/2019   Pre-diabetes 01/07/2019   Degeneration of lumbosacral intervertebral disc 03/28/2018   Full thickness rotator cuff tear 03/28/2018   Neck pain 03/28/2018   Hyperlipidemia 12/20/2016   Leukocytosis 07/17/2016   Chronic shoulder pain (Right) 04/12/2016   Cervical spondylosis 04/12/2016   Chronic upper back pain (Location of  Secondary source of pain) (Bilateral) (midline) 04/12/2016   Essential hypertension 04/12/2016   Lumbosacral radiculopathy at S1 (Left) 04/12/2016   Rotator cuff arthropathy of right shoulder 08/15/2013    Past Surgical History:  Procedure Laterality Date   COLONOSCOPY     COLONOSCOPY WITH PROPOFOL N/A 08/23/2018   Procedure: COLONOSCOPY WITH PROPOFOL;  Surgeon: Lin Landsman, MD;  Location: [facility];  Service: Gastroenterology;  Laterality: N/A;   ESOPHAGOGASTRODUODENOSCOPY N/A 07/19/2016   Procedure: ESOPHAGOGASTRODUODENOSCOPY (EGD);  Surgeon: Gatha Mayer, MD;  Location: [facility] ENDOSCOPY;  Service: Endoscopy;  Laterality: N/A;    ESOPHAGOGASTRODUODENOSCOPY (EGD) WITH PROPOFOL N/A 08/23/2018   Procedure: ESOPHAGOGASTRODUODENOSCOPY (EGD) WITH PROPOFOL;  Surgeon: Lin Landsman, MD;  Location: [facility] ENDOSCOPY;  Service: Gastroenterology;  Laterality: N/A;   ROTATOR CUFF REPAIR Left 2011   ROTATOR CUFF REPAIR Right 2013    Family History  Problem Relation Age of Onset   Heart disease Mother    Colon cancer Father    Cancer Sister    Breast cancer Sister 16   Cancer Sister     Social History   Tobacco Use   Smoking status: Former    Packs/day: 0.50    Years: 50.00    Pack years: 25.00    Types: Cigarettes    Start date: 09/30/1970    Quit date: 12/30/2020    Years since quitting: 1.0   Smokeless tobacco: Never   Tobacco comments:    quit recently   Substance Use Topics   Alcohol use: Yes    Alcohol/week: 0.0 standard drinks    Comment: Rare/occassional      Current Outpatient Medications:    amLODipine-benazepril (LOTREL) 5-20 MG capsule, Take 1 capsule by mouth daily., Disp: 90 capsule, Rfl: 1   atorvastatin (LIPITOR) 40 MG tablet, TAKE 1 TABLET BY MOUTH  DAILY, Disp: 90 tablet, Rfl: 3   ibuprofen (ADVIL,MOTRIN) 200 MG tablet, Take 1 tablet (200 mg total) by mouth 2 (two) times daily., Disp: 60 tablet, Rfl: 0   loratadine (CLARITIN) 10 MG tablet, Take 1 tablet (10 mg total) by mouth 2 (two) times daily., Disp: 180 tablet, Rfl: 1   tiZANidine (ZANAFLEX) 4 MG tablet, TAKE 1 TABLET BY MOUTH 3  TIMES DAILY AS NEEDED FOR  MUSCLE SPASM(S), Disp: 360 tablet, Rfl: 1   zolpidem (AMBIEN CR) 6.25 MG CR tablet, Take 1 tablet (6.25 mg total) by mouth at bedtime as needed for sleep., Disp: 90 tablet, Rfl: 0  Allergies  Allergen Reactions   Naproxen Sodium Rash and Other (See Comments)    800 mg tablets that the MD prescribed for pain.    I personally reviewed active problem list, medication list, allergies, family history, social history, health maintenance with the patient/caregiver  today.   ROS  Constitutional: Negative for fever , positive for  weight change.  Respiratory: positive  for cough and shortness of breath.   Cardiovascular: Negative for chest pain or palpitations.  Gastrointestinal: Negative for abdominal pain, no bowel changes.  Musculoskeletal: Negative for gait problem or joint swelling.  Skin: Negative for rash.  Neurological: Negative for dizziness or headache.  No other specific complaints in a complete review of systems (except as listed in HPI above).   Objective  Vitals:   12/31/21 0832  BP: 112/70  Pulse: 93  Resp: 16  Temp: 98.1 F (36.7 C)  SpO2: 97%  Weight: 275 lb (124.7 kg)  Height: 6' (1.829 m)    Body mass index is 37.3 kg/m.  Physical Exam  Constitutional: Patient appears well-developed and well-nourished. Obese  No distress.  HEENT: head atraumatic, normocephalic, pupils equal and reactive to light, neck supple Cardiovascular: Normal rate, regular rhythm and normal heart sounds.  No murmur heard. No BLE edema. Pulmonary/Chest: Effort normal and breath sounds normal. No respiratory distress. Abdominal: Soft.  There is no tenderness. Psychiatric: Patient has a normal mood and affect. behavior is normal. Judgment and thought content normal.   PHQ2/9: Depression screen [facility] 2/9 12/31/2021 06/29/2021 01/19/2021 09/30/2020 04/10/2020  Decreased Interest 0 0 2 0 0  Down, Depressed, Hopeless 0 0 2 0 0  PHQ - 2 Score 0 0 4 0 0  Altered sleeping 0 - 1 - 0  Tired, decreased energy 0 - 0 - 0  Change in appetite 0 - 1 - 0  Feeling bad or failure about yourself  0 - 0 - 0  Trouble concentrating 0 - 1 - 0  Moving slowly or fidgety/restless 0 - 0 - 0  Suicidal thoughts 0 - 0 - 0  PHQ-9 Score 0 - 7 - 0  Difficult doing work/chores - - Somewhat difficult - Not difficult at all    phq 9 is negative   Fall Risk: Fall Risk  12/31/2021 06/29/2021 01/19/2021 09/30/2020 04/10/2020  Falls in the past year? 1 0 1 0 0  Number falls in past  yr: 1 0 1 0 0  Comment - - 3 - -  Injury with Fall? 1 0 0 0 0  Risk for fall due to : No Fall Risks - History of fall(s) - -  Follow up Falls prevention discussed - Falls prevention discussed - -  Comment - - fell on ice - -      Functional Status Survey: Is the patient deaf or have difficulty hearing?: Yes Does the patient have difficulty seeing, even when wearing glasses/contacts?: No Does the patient have difficulty concentrating, remembering, or making decisions?: Yes Does the patient have difficulty walking or climbing stairs?: Yes Does the patient have difficulty dressing or bathing?: No Does the patient have difficulty doing errands alone such as visiting a doctor's office or shopping?: No    Assessment & Plan  1. Essential hypertension  - amLODipine-benazepril (LOTREL) 5-20 MG capsule; Take 1 capsule by mouth daily.  Dispense: 90 capsule; Refill: 1  2. Chronic pain syndrome   3. Mixed hyperlipidemia  - atorvastatin (LIPITOR) 40 MG tablet; Take 1 tablet (40 mg total) by mouth daily.  Dispense: 90 tablet; Refill: 3  4. Lumbosacral radiculopathy at S1   5. Morbid obesity ([place])  Discussed all optiosns for weight loss medications including Belviq, Qsymia, Saxenda and Contrave. Discussed risk and benefits of each of them.   6. Simple chronic bronchitis ([place])  He quit smoking   7. Primary osteoarthritis of both hands   8. Tobacco use  He quit   9. History of alcoholism ([place])   10. Leukocytosis, unspecified type   11. Insomnia, unspecified type  - zolpidem (AMBIEN CR) 6.25 MG CR tablet; Take 1 tablet (6.25 mg total) by mouth at bedtime as needed for sleep.  Dispense: 90 tablet; Refill: 0  12. Back muscle spasm  - tiZANidine (ZANAFLEX) 4 MG tablet; TAKE 1 TABLET BY MOUTH 3  TIMES DAILY AS NEEDED FOR  MUSCLE SPASM(S)  Dispense: 360 tablet; Refill: 1  13. Hyperglycemia  - POCT HgB A1C  14. Umbilical hernia without obstruction and without  gangrene   15. Perennial allergic rhinitis with seasonal variation  -  loratadine (CLARITIN) 10 MG tablet; Take 1 tablet (10 mg total) by mouth 2 (two) times daily.  Dispense: 180 tablet; Refill: 1 - fluticasone (FLONASE) 50 MCG/ACT nasal spray; Place 2 sprays into both nostrils daily.  Dispense: 48 g; Refill: 1

## 2021-12-31 ENCOUNTER — Encounter: Payer: Self-pay | Admitting: Family Medicine

## 2021-12-31 ENCOUNTER — Ambulatory Visit: Payer: Managed Care, Other (non HMO) | Admitting: Family Medicine

## 2021-12-31 VITALS — BP 112/70 | HR 93 | Temp 98.1°F | Resp 16 | Ht 72.0 in | Wt 275.0 lb

## 2021-12-31 DIAGNOSIS — R739 Hyperglycemia, unspecified: Secondary | ICD-10-CM

## 2021-12-31 DIAGNOSIS — I10 Essential (primary) hypertension: Secondary | ICD-10-CM

## 2021-12-31 DIAGNOSIS — G4700 Insomnia, unspecified: Secondary | ICD-10-CM

## 2021-12-31 DIAGNOSIS — Z87891 Personal history of nicotine dependence: Secondary | ICD-10-CM

## 2021-12-31 DIAGNOSIS — K429 Umbilical hernia without obstruction or gangrene: Secondary | ICD-10-CM

## 2021-12-31 DIAGNOSIS — E782 Mixed hyperlipidemia: Secondary | ICD-10-CM

## 2021-12-31 DIAGNOSIS — J3089 Other allergic rhinitis: Secondary | ICD-10-CM

## 2021-12-31 DIAGNOSIS — M19041 Primary osteoarthritis, right hand: Secondary | ICD-10-CM

## 2021-12-31 DIAGNOSIS — M62830 Muscle spasm of back: Secondary | ICD-10-CM

## 2021-12-31 DIAGNOSIS — D72829 Elevated white blood cell count, unspecified: Secondary | ICD-10-CM

## 2021-12-31 DIAGNOSIS — J410 Simple chronic bronchitis: Secondary | ICD-10-CM

## 2021-12-31 DIAGNOSIS — M5417 Radiculopathy, lumbosacral region: Secondary | ICD-10-CM

## 2021-12-31 DIAGNOSIS — M19042 Primary osteoarthritis, left hand: Secondary | ICD-10-CM

## 2021-12-31 DIAGNOSIS — G894 Chronic pain syndrome: Secondary | ICD-10-CM | POA: Diagnosis not present

## 2021-12-31 DIAGNOSIS — J302 Other seasonal allergic rhinitis: Secondary | ICD-10-CM

## 2021-12-31 DIAGNOSIS — F1021 Alcohol dependence, in remission: Secondary | ICD-10-CM

## 2021-12-31 LAB — POCT GLYCOSYLATED HEMOGLOBIN (HGB A1C): Hemoglobin A1C: 5.7 % — AB (ref 4.0–5.6)

## 2021-12-31 MED ORDER — ZOLPIDEM TARTRATE ER 6.25 MG PO TBCR: 6.2500 mg | EXTENDED_RELEASE_TABLET | Freq: Every evening | ORAL | 0 refills | Status: DC | PRN

## 2021-12-31 MED ORDER — TIZANIDINE HCL 4 MG PO TABS: ORAL_TABLET | ORAL | 1 refills | Status: DC

## 2021-12-31 MED ORDER — FLUTICASONE PROPIONATE 50 MCG/ACT NA SUSP: 2.0000 | Freq: Every day | NASAL | 1 refills | Status: DC

## 2021-12-31 MED ORDER — LORATADINE 10 MG PO TABS: 10.0000 mg | ORAL_TABLET | Freq: Two times a day (BID) | ORAL | 1 refills | Status: DC

## 2021-12-31 MED ORDER — AMLODIPINE BESY-BENAZEPRIL HCL 5-20 MG PO CAPS: 1.0000 | ORAL_CAPSULE | Freq: Every day | ORAL | 1 refills | Status: DC

## 2021-12-31 MED ORDER — ATORVASTATIN CALCIUM 40 MG PO TABS: 40.0000 mg | ORAL_TABLET | Freq: Every day | ORAL | 3 refills | Status: DC

## 2022-04-01 NOTE — Patient Instructions (Signed)

## 2022-04-01 NOTE — Progress Notes (Signed)
Name: Carl Burke   MRN: [id number]    DOB: 06/06/1956   Date:04/04/2022 ? ?     Progress Note ? ?Subjective ? ?Chief Complaint ? ?Annual Exam ? ?HPI ? ?Patient presents for annual CPE and bronchitis follow up ? ?COPD flare:  he developed SOB on 03/21/2022 and went to Urgent Care on 03/22/2022 . Reviewed labs and SARS-COV2 PCR, RSV and flu negative. He was sent home on Zpack, prednisone and Proair. He smoked for many years but has been quitting on and off . Quit last time a few weeks ago. He states cough has improved, only has spells when talking, no sob or wheezing. He feels tired, dizzy and has a headache described like pressure on nuchal area. No fever or chills since treated  ? ?Hyperglycemia : last A1C was 6.5 % he denies polyuria, polydipsia or polyphagia. We will recheck labs  ? ?Leucocytosis: usually elevated and we will recheck today  ? ?Dyslipidemia: LDL has improved down from 110 to 87, HDL dropped a little. Discussed increase intake of fish, tree nuts and try to walk more  ? ?History of alcoholism: he states he still makes beer and wine, also moonshine and drinks whatever he likes but does not get drunk  ? ? ?IPSS Questionnaire (AUA-7): ?Over the past month?   ?1)  How often have you had a sensation of not emptying your bladder completely after you finish urinating?  0 - Not at all  ?2)  How often have you had to urinate again less than two hours after you finished urinating? 0 - Not at all  ?3)  How often have you found you stopped and started again several times when you urinated?  0 - Not at all  ?4) How difficult have you found it to postpone urination?  0 - Not at all  ?5) How often have you had a weak urinary stream?  0 - Not at all  ?6) How often have you had to push or strain to begin urination?  0 - Not at all  ?7) How many times did you most typically get up to urinate from the time you went to bed until the time you got up in the morning?  1 - 1 time  ?Total score:  0-7 mildly symptomatic  ?  8-19 moderately symptomatic  ? 20-35 severely symptomatic  ?  ? ?Diet: eats at home  ?Exercise: discussed 150 minutes per week  ? ?Depression: phq 9 is negative ? ?  04/04/2022  ?  2:56 PM 12/31/2021  ?  8:31 AM 06/29/2021  ? 11:28 AM 01/19/2021  ?  8:59 AM 09/30/2020  ? 10:52 AM  ?Depression screen PHQ 2/9  ?Decreased Interest 0 0 0 2 0  ?Down, Depressed, Hopeless 0 0 0 2 0  ?PHQ - 2 Score 0 0 0 4 0  ?Altered sleeping 0 0  1   ?Tired, decreased energy 0 0  0   ?Change in appetite 0 0  1   ?Feeling bad or failure about yourself  0 0  0   ?Trouble concentrating 0 0  1   ?Moving slowly or fidgety/restless 0 0  0   ?Suicidal thoughts 0 0  0   ?PHQ-9 Score 0 0  7   ?Difficult doing work/chores    Somewhat difficult   ? ? ?Hypertension:  ?BP Readings from Last 3 Encounters:  ?04/04/22 126/68  ?12/31/21 112/70  ?06/29/21 130/74  ? ? ?Obesity: ?Wt Readings from Last  3 Encounters:  ?04/04/22 274 lb (124.3 kg)  ?12/31/21 275 lb (124.7 kg)  ?06/29/21 274 lb (124.3 kg)  ? ?BMI Readings from Last 3 Encounters:  ?04/04/22 38.22 kg/m?  ?12/31/21 37.30 kg/m?  ?06/29/21 37.16 kg/m?  ?  ? ?Lipids:  ?Lab Results  ?Component Value Date  ? CHOL 156 05/07/2021  ? CHOL 203 (H) 10/06/2020  ? CHOL 158 10/30/2019  ? ?Lab Results  ?Component Value Date  ? HDL 35 (L) 05/07/2021  ? HDL 40 10/06/2020  ? HDL 35 (L) 10/30/2019  ? ?Lab Results  ?Component Value Date  ? [place] 87 05/07/2021  ? [place] 110 (H) 10/06/2020  ? [place] 89 10/30/2019  ? ?Lab Results  ?Component Value Date  ? TRIG 200 (H) 05/07/2021  ? TRIG 309 (H) 10/06/2020  ? TRIG 245 (H) 10/30/2019  ? ?Lab Results  ?Component Value Date  ? CHOLHDL 4.5 05/07/2021  ? CHOLHDL 5.1 (H) 10/06/2020  ? CHOLHDL 4.5 10/30/2019  ? ?No results found for: LDLDIRECT ?Glucose:  ?Glucose  ?Date Value Ref Range Status  ?05/07/2021 103 (H) 65 - 99 mg/dL Final  ?10/06/2020 134 (H) 65 - 99 mg/dL Final  ?01/03/2019 95 65 - 99 mg/dL Final  ? ?Glucose, Bld  ?Date Value Ref Range Status  ?10/30/2019 117 (H) 65 -  99 mg/dL Final  ?  Comment:  ?  . ?           Fasting reference interval ?. ?For someone without known diabetes, a glucose value ?between 100 and 125 mg/dL is consistent with ?prediabetes and should be confirmed with a ?follow-up test. ?. ?  ?11/09/2016 136 (H) 65 - 99 mg/dL Final  ?07/17/2016 108 (H) 65 - 99 mg/dL Final  ? ? ?[place] Office Visit from 04/04/2022 in [facility]  ?AUDIT-C Score 2  ? ?  ? ? ?Married ?STD testing and prevention (HIV/chl/gon/syphilis): 07/18/18 ?Sexual history: only one partner, his wife - she has pemphigus and unable to have intercourse frequently  ?Hep C Screening: 01/03/19 ?Skin cancer: Discussed monitoring for atypical lesions ?Colorectal cancer: 08/23/18 ?Prostate cancer:  N/A ? ? ? ?Lung cancer:  Low Dose CT Chest recommended if Age 47-80 years, 30 pack-year currently smoking OR have quit w/in 15years. Patient  no a candidate for screening   ?AAA: The USPSTF recommends one-time screening with ultrasonography in men ages 17 to 20 years who have ever smoked. Patient   no, a candidate for screening  ?ECG:  07/17/16 ? ?Vaccines:  ? ?HPV: N/A ?Tdap: up to date ?Shingrix: refused  ?Pneumonia: refused  ?Flu:  refused  ?COVID-19: J&J ? ?Advanced Care Planning: A voluntary discussion about advance care planning including the explanation and discussion of advance directives.  Discussed health care proxy and Living will, and the patient was able to identify a health care proxy as wife.  Patient does have a living will and power of attorney of health care  ? ?Patient Active Problem List  ? Diagnosis Date Noted  ? Seasonal affective disorder ([place]) 01/19/2021  ? Dyslipidemia 10/31/2019  ? Umbilical hernia without obstruction and without gangrene 07/17/2019  ? Pre-diabetes 01/07/2019  ? Degeneration of lumbosacral intervertebral disc 03/28/2018  ? Full thickness rotator cuff tear 03/28/2018  ? Neck pain 03/28/2018  ? Hyperlipidemia 12/20/2016  ? Leukocytosis 07/17/2016  ?  Chronic shoulder pain (Right) 04/12/2016  ? Cervical spondylosis 04/12/2016  ? Chronic upper back pain (Location of Secondary source of pain) (Bilateral) (midline) 04/12/2016  ? Essential  hypertension 04/12/2016  ? Lumbosacral radiculopathy at S1 (Left) 04/12/2016  ? Rotator cuff arthropathy of right shoulder 08/15/2013  ? ? ?Past Surgical History:  ?Procedure Laterality Date  ? COLONOSCOPY    ? COLONOSCOPY WITH PROPOFOL N/A 08/23/2018  ? Procedure: COLONOSCOPY WITH PROPOFOL;  Surgeon: Lin Landsman, MD;  Location[facility] ENDOSCOPY;  Service: Gastroenterology;  Laterality: N/A;  ? ESOPHAGOGASTRODUODENOSCOPY N/A 07/19/2016  ? Procedure: ESOPHAGOGASTRODUODENOSCOPY (EGD);  Surgeon: Gatha Mayer, MD;  Location: [facility] ENDOSCOPY;  Service: Endoscopy;  Laterality: N/A;  ? ESOPHAGOGASTRODUODENOSCOPY (EGD) WITH PROPOFOL N/A 08/23/2018  ? Procedure: ESOPHAGOGASTRODUODENOSCOPY (EGD) WITH PROPOFOL;  Surgeon: Lin Landsman, MD;  Location: [facility] ENDOSCOPY;  Service: Gastroenterology;  Laterality: N/A;  ? ROTATOR CUFF REPAIR Left 2011  ? ROTATOR CUFF REPAIR Right 2013  ? ? ?Family History  ?Problem Relation Age of Onset  ? Heart disease Mother   ? Colon cancer Father   ? Cancer Sister   ? Breast cancer Sister 67  ? Cancer Sister   ? ? ?Social History  ? ?Socioeconomic History  ? Marital status: Married  ?  Spouse name: Blanch Media  ? Number of children: 3  ? Years of education: Not on file  ? Highest education level: Not on file  ?Occupational History  ? Occupation: retired   ?Tobacco Use  ? Smoking status: Former  ?  Packs/day: 0.50  ?  Years: 50.00  ?  Pack years: 25.00  ?  Types: Cigarettes  ?  Start date: 09/30/1970  ?  Quit date: 03/12/2022  ?  Years since quitting: 0.0  ? Smokeless tobacco: Never  ?Vaping Use  ? Vaping Use: Never used  ?Substance and Sexual Activity  ? Alcohol use: Yes  ?  Alcohol/week: 0.0 standard drinks  ?  Comment: Rare/occassional   ? Drug use: No  ? Sexual activity: Yes  ?  Partners: Female  ?Other Topics  Concern  ? Not on file  ?Social History Narrative  ? 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 He used to be very active, boxer  ? ?Social Determinants of

## 2022-04-04 ENCOUNTER — Encounter: Payer: Self-pay | Admitting: Family Medicine

## 2022-04-04 ENCOUNTER — Ambulatory Visit (INDEPENDENT_AMBULATORY_CARE_PROVIDER_SITE_OTHER): Payer: Managed Care, Other (non HMO) | Admitting: Family Medicine

## 2022-04-04 VITALS — BP 126/68 | HR 78 | Resp 16 | Ht 71.0 in | Wt 274.0 lb

## 2022-04-04 DIAGNOSIS — Z0000 Encounter for general adult medical examination without abnormal findings: Secondary | ICD-10-CM | POA: Diagnosis not present

## 2022-04-04 DIAGNOSIS — D692 Other nonthrombocytopenic purpura: Secondary | ICD-10-CM | POA: Insufficient documentation

## 2022-04-04 DIAGNOSIS — G4700 Insomnia, unspecified: Secondary | ICD-10-CM | POA: Diagnosis not present

## 2022-04-04 DIAGNOSIS — J441 Chronic obstructive pulmonary disease with (acute) exacerbation: Secondary | ICD-10-CM

## 2022-04-04 DIAGNOSIS — I10 Essential (primary) hypertension: Secondary | ICD-10-CM | POA: Diagnosis not present

## 2022-04-04 DIAGNOSIS — F1021 Alcohol dependence, in remission: Secondary | ICD-10-CM

## 2022-04-04 DIAGNOSIS — R7303 Prediabetes: Secondary | ICD-10-CM

## 2022-04-04 DIAGNOSIS — E782 Mixed hyperlipidemia: Secondary | ICD-10-CM

## 2022-04-04 MED ORDER — TRELEGY ELLIPTA 100-62.5-25 MCG/ACT IN AEPB: 1.0000 | INHALATION_SPRAY | Freq: Every day | RESPIRATORY_TRACT | 5 refills | Status: DC

## 2022-05-12 ENCOUNTER — Other Ambulatory Visit: Payer: Self-pay | Admitting: Family Medicine

## 2022-05-12 DIAGNOSIS — G4700 Insomnia, unspecified: Secondary | ICD-10-CM

## 2022-05-12 DIAGNOSIS — M62830 Muscle spasm of back: Secondary | ICD-10-CM

## 2022-06-29 NOTE — Progress Notes (Signed)
Name: Carl Burke   MRN: [id number]    DOB: 08-04-1956   Date:06/30/2022       Progress Note  Subjective  Chief Complaint  Follow up   HPI  Breast mass: left nipple was tender, we checked mammogram back in 10/2019 and was clear, he states currently no problems, it goes up and down    Chronic bronchitis: he has been smoking since age 66, he states initially just a few cigarettes, in his 6's and 56 's he smoked up to 3 packs per day. 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 BP is at goal today.   Leucocytosis: it goes up and down, discussed it may be secondary to smoking, he has seen hematologist . He is due for repeat labs today    Hyperglycemia: he denies polyphagia, polydipsia or polyuria. Last A1C was  6.5% discussed diagnosis of DM, he changed his eating habits and we will recheck labs today    Chronic back pain, neck pain, knee pain radiculitis/also hands: used to take narcotics , seen by pain clinic, but he states medications did not help so he stopped everything , only on Zanaflex three times a day. Pain right now is 7/10 ( because he was more active yesterday )  but on average it is 5/10 . He has intermittent tingling on both thumbs and index finger    Dyslipidemia: taking Atorvastatin  but off  Vascepa because of cost, last LDL was 87. We will recheck labs today, he is not fasting and triglycerides may be elevated    Morbid obesity : BMI above 35 with co-morbidies , such as HTN, chronic back pain, dyslipidemia. He had lost weight but now weight is up 10 lbs again. Reminded him the importance of losing weight     Umbilical hernia: hernia has been present for many years, he saw surgeon, he is wearing a compression vest . Stable    Insomnia: chronic, takes Ambien every other night and it helps him fall and stay asleep. He gets 90 days but lasts about 5-6 months. He states it helps with his sleep about half the time, but does not want to change . Stable    History of alcoholism: he states used to drink a bottle of hard liquor per night in his 30's and 64 's he had 4 DUI's in his life time, but states now only drinks occasionally and no more than one shot, only when he skips his sleeping medications. Occasionally has a beer   Perennial AR: he is taking loratadine, he states he has a lot of post-nasal drainage, we added Flonase but he states it did not helps so he stopped using it He states with the air pollution has make symptoms worse , he is taking benadryl , discussed risk of benadryl   Dysthymia: he states he would like to work but cannot due to his joint problems, he gets frustrated about not being able to make as much money. He does not want medications. " I am feeling sorry myself" but not all  the time. He also has a history of seasonal affective disorder but does not like taking medications for that either   Senile purpura: on arms, reassurance given   Skin lesions: on both arms, likely SK but could be AK, he does not want to see Dermatologist    Patient Active Problem List   Diagnosis Date Noted   Senile purpura ([place]) 04/04/2022   History of alcoholism ([place]) 04/04/2022   Seasonal affective disorder ([place]) 01/19/2021   Dyslipidemia 89/21/1941   Umbilical hernia without obstruction and without gangrene 07/17/2019   Pre-diabetes 01/07/2019   Degeneration of lumbosacral intervertebral disc 03/28/2018   Full thickness rotator cuff tear 03/28/2018   Neck pain 03/28/2018   Hyperlipidemia 12/20/2016   Leukocytosis 07/17/2016   Chronic shoulder pain (Right) 04/12/2016   Cervical spondylosis  04/12/2016   Chronic upper back pain (Location of Secondary source of pain) (Bilateral) (midline) 04/12/2016   Essential hypertension 04/12/2016   Lumbosacral radiculopathy at S1 (Left) 04/12/2016   Rotator cuff arthropathy of right shoulder 08/15/2013    Past Surgical History:  Procedure Laterality Date   COLONOSCOPY     COLONOSCOPY WITH PROPOFOL N/A 08/23/2018   Procedure: COLONOSCOPY WITH PROPOFOL;  Surgeon: Lin Landsman, MD;  Location[facility] ENDOSCOPY;  Service: Gastroenterology;  Laterality: N/A;   ESOPHAGOGASTRODUODENOSCOPY N/A 07/19/2016   Procedure: ESOPHAGOGASTRODUODENOSCOPY (EGD);  Surgeon: Gatha Mayer, MD;  Location: [facility] ENDOSCOPY;  Service: Endoscopy;  Laterality: N/A;   ESOPHAGOGASTRODUODENOSCOPY (EGD) WITH PROPOFOL N/A 08/23/2018   Procedure: ESOPHAGOGASTRODUODENOSCOPY (EGD) WITH PROPOFOL;  Surgeon: Lin Landsman, MD;  Location: [facility] ENDOSCOPY;  Service: Gastroenterology;  Laterality: N/A;   ROTATOR CUFF REPAIR Left 2011   ROTATOR CUFF REPAIR Right 2013    Family History  Problem Relation Age of Onset   Heart disease Mother    Colon cancer Father    Cancer Sister    Breast cancer Sister 74   Cancer Sister     Social History   Tobacco Use   Smoking status: Former    Packs/day: 0.50    Years: 50.00    Total pack years: 25.00    Types: Cigarettes    Start date: 09/30/1970    Quit date: 03/12/2022    Years since quitting: 0.3   Smokeless tobacco: Never  Substance Use Topics   Alcohol use: Yes    Alcohol/week: 0.0 standard drinks of alcohol    Comment: Rare/occassional      Current Outpatient Medications:    amLODipine-benazepril (LOTREL) 5-20 MG capsule, Take 1 capsule by mouth daily., Disp: 90 capsule, Rfl: 1   atorvastatin (LIPITOR) 40 MG tablet, Take 1 tablet (40 mg total) by mouth daily., Disp: 90 tablet, Rfl: 3   Fluticasone-Umeclidin-Vilant (TRELEGY ELLIPTA) 100-62.5-25 MCG/ACT AEPB, Inhale 1 puff into the lungs daily., Disp: 1 each, Rfl: 5    Ibuprofen 200 MG CAPS, Take by mouth., Disp: , Rfl:    loratadine (CLARITIN) 10 MG tablet, Take 1 tablet (10 mg total) by mouth 2 (two) times daily., Disp: 180 tablet, Rfl: 1   tiZANidine (ZANAFLEX) 4 MG tablet, TAKE 1 TABLET BY MOUTH 3 TIMES  DAILY AS NEEDED FOR MUSCLE  SPASM(S), Disp: 270 tablet, Rfl: 0   zolpidem (AMBIEN CR) 6.25 MG CR tablet, TAKE 1 TABLET BY MOUTH AT  BEDTIME AS NEEDED FOR SLEEP, Disp: 90 tablet, Rfl: 0  Allergies  Allergen Reactions   Naproxen Sodium Rash and Other (See Comments)    800 mg tablets that the MD prescribed for pain.  I personally reviewed active problem list, medication list, allergies, family history, social history, health maintenance with the patient/caregiver today.   ROS  Constitutional: Negative for fever , positive for weight change.  Respiratory: Negative for cough or shortness of breath. Wheezing occasionally  Cardiovascular: Negative for chest pain or palpitations.  Gastrointestinal: Negative for abdominal pain, no bowel changes.  Musculoskeletal: Negative for gait problem or joint swelling.  Skin: Negative for rash.  Neurological: Negative for dizziness or headache.  No other specific complaints in a complete review of systems (except as listed in HPI above).   Objective  Vitals:   06/30/22 0835  BP: 120/70  Pulse: 86  Resp: 16  SpO2: 97%  Weight: 285 lb (129.3 kg)  Height: 6' (1.829 m)    Body mass index is 38.65 kg/m.  Physical Exam  Constitutional: Patient appears well-developed and well-nourished. Obese  No distress.  HEENT: head atraumatic, normocephalic, pupils equal and reactive to ligh, neck supple Cardiovascular: Normal rate, regular rhythm and normal heart sounds.  No murmur heard. No BLE edema. Pulmonary/Chest: Effort normal and breath sounds normal. No respiratory distress. Abdominal: Soft.  There is no tenderness. Muscular Skeletal: moving around in the room, antalgic gait  Psychiatric: Patient has a  normal mood and affect. behavior is normal. Judgment and thought content normal.    PHQ2/9:    06/30/2022    8:34 AM 04/04/2022    2:56 PM 12/31/2021    8:31 AM 06/29/2021   11:28 AM 01/19/2021    8:59 AM  Depression screen PHQ 2/9  Decreased Interest 2 0 0 0 2  Down, Depressed, Hopeless 0 0 0 0 2  PHQ - 2 Score 2 0 0 0 4  Altered sleeping 3 0 0  1  Tired, decreased energy 3 0 0  0  Change in appetite 0 0 0  1  Feeling bad or failure about yourself  1 0 0  0  Trouble concentrating 0 0 0  1  Moving slowly or fidgety/restless 0 0 0  0  Suicidal thoughts 0 0 0  0  PHQ-9 Score 9 0 0  7  Difficult doing work/chores     Somewhat difficult    phq 9 is positive    Fall Risk:    06/30/2022    8:34 AM 04/04/2022    2:56 PM 12/31/2021    8:31 AM 06/29/2021   11:28 AM 01/19/2021    8:58 AM  Fall Risk   Falls in the past year? 0 0 1 0 1  Number falls in past yr: 0 0 1 0 1  Comment     3  Injury with Fall? 0 0 1 0 0  Risk for fall due to : No Fall Risks No Fall Risks No Fall Risks  History of fall(s)  Follow up Falls prevention discussed Falls prevention discussed Falls prevention discussed  Falls prevention discussed  Comment     fell on ice      Functional Status Survey: Is the patient deaf or have difficulty hearing?: No Does the patient have difficulty seeing, even when wearing glasses/contacts?: No Does the patient have difficulty concentrating, remembering, or making decisions?: No Does the patient have difficulty walking or climbing stairs?: No Does the patient have difficulty dressing or bathing?: No Does the patient have difficulty doing errands alone such as visiting a doctor's office or shopping?: No    Assessment & Plan  1. Senile purpura (HCC)  Stable  2. Seasonal affective  disorder ([place])  With some dysthymia at this time, but does not want medication  3. History of alcoholism ([place])   4. Simple chronic bronchitis ([place])  On Trelegy   5. Morbid obesity  ([place])  Discussed with the patient the risk posed by an increased BMI. Discussed importance of portion control, calorie counting and at least 150 minutes of physical activity weekly. Avoid sweet beverages and drink more water. Eat at least 6 servings of fruit and vegetables daily    6. Chronic pain syndrome   7. Perennial allergic rhinitis with seasonal variation   8. Pre-diabetes   9. Umbilical hernia without obstruction and without gangrene   10. Essential hypertension  - CBC with Differential/Platelet - Comprehensive metabolic panel - amLODipine-benazepril (LOTREL) 5-20 MG capsule; Take 1 capsule by mouth daily.  Dispense: 90 capsule; Refill: 1  11. Hyperglycemia  - Hemoglobin A1c  12. Mixed hyperlipidemia  - Lipid panel

## 2022-06-30 ENCOUNTER — Ambulatory Visit: Payer: Managed Care, Other (non HMO) | Admitting: Family Medicine

## 2022-06-30 ENCOUNTER — Encounter: Payer: Self-pay | Admitting: Family Medicine

## 2022-06-30 VITALS — BP 120/70 | HR 86 | Resp 16 | Ht 72.0 in | Wt 285.0 lb

## 2022-06-30 DIAGNOSIS — D692 Other nonthrombocytopenic purpura: Secondary | ICD-10-CM

## 2022-06-30 DIAGNOSIS — F338 Other recurrent depressive disorders: Secondary | ICD-10-CM

## 2022-06-30 DIAGNOSIS — J302 Other seasonal allergic rhinitis: Secondary | ICD-10-CM

## 2022-06-30 DIAGNOSIS — R7303 Prediabetes: Secondary | ICD-10-CM

## 2022-06-30 DIAGNOSIS — J410 Simple chronic bronchitis: Secondary | ICD-10-CM | POA: Diagnosis not present

## 2022-06-30 DIAGNOSIS — J3089 Other allergic rhinitis: Secondary | ICD-10-CM

## 2022-06-30 DIAGNOSIS — R739 Hyperglycemia, unspecified: Secondary | ICD-10-CM

## 2022-06-30 DIAGNOSIS — G894 Chronic pain syndrome: Secondary | ICD-10-CM

## 2022-06-30 DIAGNOSIS — F1021 Alcohol dependence, in remission: Secondary | ICD-10-CM

## 2022-06-30 DIAGNOSIS — K429 Umbilical hernia without obstruction or gangrene: Secondary | ICD-10-CM

## 2022-06-30 DIAGNOSIS — E782 Mixed hyperlipidemia: Secondary | ICD-10-CM

## 2022-06-30 DIAGNOSIS — I10 Essential (primary) hypertension: Secondary | ICD-10-CM

## 2022-06-30 MED ORDER — AMLODIPINE BESY-BENAZEPRIL HCL 5-20 MG PO CAPS: 1.0000 | ORAL_CAPSULE | Freq: Every day | ORAL | 1 refills | Status: DC

## 2022-07-07 ENCOUNTER — Encounter: Payer: Managed Care, Other (non HMO) | Admitting: Dermatology

## 2022-07-26 LAB — COMPREHENSIVE METABOLIC PANEL
ALT: 50 IU/L — ABNORMAL HIGH (ref 0–44)
AST: 29 IU/L (ref 0–40)
Albumin/Globulin Ratio: 1.8 (ref 1.2–2.2)
Albumin: 4.1 g/dL (ref 3.9–4.9)
Alkaline Phosphatase: 78 IU/L (ref 44–121)
BUN/Creatinine Ratio: 13 (ref 10–24)
BUN: 8 mg/dL (ref 8–27)
Bilirubin Total: 0.4 mg/dL (ref 0.0–1.2)
CO2: 20 mmol/L (ref 20–29)
Calcium: 9.1 mg/dL (ref 8.6–10.2)
Chloride: 101 mmol/L (ref 96–106)
Creatinine, Ser: 0.63 mg/dL — ABNORMAL LOW (ref 0.76–1.27)
Globulin, Total: 2.3 g/dL (ref 1.5–4.5)
Glucose: 106 mg/dL — ABNORMAL HIGH (ref 70–99)
Potassium: 4.4 mmol/L (ref 3.5–5.2)
Sodium: 138 mmol/L (ref 134–144)
Total Protein: 6.4 g/dL (ref 6.0–8.5)
eGFR: 106 mL/min/{1.73_m2} (ref >59)

## 2022-07-26 LAB — CBC WITH DIFFERENTIAL/PLATELET
Basophils Absolute: 0.1 10*3/uL (ref 0.0–0.2)
Basos: 1 %
EOS (ABSOLUTE): 0.2 10*3/uL (ref 0.0–0.4)
Eos: 2 %
Hematocrit: 48.6 % (ref 37.5–51.0)
Hemoglobin: 16.6 g/dL (ref 13.0–17.7)
Immature Grans (Abs): 0 10*3/uL (ref 0.0–0.1)
Immature Granulocytes: 0 %
Lymphocytes Absolute: 3.3 10*3/uL — ABNORMAL HIGH (ref 0.7–3.1)
Lymphs: 29 %
MCH: 30.9 pg (ref 26.6–33.0)
MCHC: 34.2 g/dL (ref 31.5–35.7)
MCV: 90 fL (ref 79–97)
Monocytes Absolute: 0.8 10*3/uL (ref 0.1–0.9)
Monocytes: 8 %
Neutrophils Absolute: 6.8 10*3/uL (ref 1.4–7.0)
Neutrophils: 60 %
Platelets: 257 10*3/uL (ref 150–450)
RBC: 5.38 x10E6/uL (ref 4.14–5.80)
RDW: 13.8 % (ref 11.6–15.4)
WBC: 11.3 10*3/uL — ABNORMAL HIGH (ref 3.4–10.8)

## 2022-07-26 LAB — LIPID PANEL
Chol/HDL Ratio: 4.9 ratio (ref 0.0–5.0)
Cholesterol, Total: 175 mg/dL (ref 100–199)
HDL: 36 mg/dL — ABNORMAL LOW (ref >39)
LDL Chol Calc (NIH): 97 mg/dL (ref 0–99)
Triglycerides: 244 mg/dL — ABNORMAL HIGH (ref 0–149)
VLDL Cholesterol Cal: 42 mg/dL — ABNORMAL HIGH (ref 5–40)

## 2022-07-26 LAB — HEMOGLOBIN A1C
Est. average glucose Bld gHb Est-mCnc: 137 mg/dL
Hgb A1c MFr Bld: 6.4 % — ABNORMAL HIGH (ref 4.8–5.6)

## 2022-08-23 ENCOUNTER — Encounter: Payer: Self-pay | Admitting: Internal Medicine

## 2022-08-23 ENCOUNTER — Ambulatory Visit: Payer: Self-pay

## 2022-08-23 ENCOUNTER — Ambulatory Visit: Payer: Managed Care, Other (non HMO) | Admitting: Internal Medicine

## 2022-08-23 VITALS — BP 132/64 | HR 88 | Temp 98.1°F | Resp 18 | Ht 71.0 in | Wt 280.6 lb

## 2022-08-23 DIAGNOSIS — E559 Vitamin D deficiency, unspecified: Secondary | ICD-10-CM | POA: Diagnosis not present

## 2022-08-23 DIAGNOSIS — M7989 Other specified soft tissue disorders: Secondary | ICD-10-CM | POA: Diagnosis not present

## 2022-08-23 NOTE — Telephone Encounter (Signed)
  Chief Complaint: bilateral ankle and feet swelling Symptoms: looks like a bruise  Frequency: x 1 month Pertinent Negatives: Patient denies calf pain, chest pain, difficulty breathing Disposition: '[]'$ ED /'[]'$ Urgent Care (no appt availability in office) / '[x]'$ Appointment(In office/virtual)/ '[]'$  [place]/ '[]'$ Home Care/ '[]'$ Refused Recommended Disposition /'[]'$ [place] [facility]/ '[]'$  Follow-up with PCP Additional Notes: n/a Reason for Disposition  [1] MILD swelling of both ankles (i.e., pedal edema) AND [2] new-onset or worsening  Answer Assessment - Initial Assessment Questions 1. ONSET: "When did the swelling start?" (e.g., minutes, hours, days)     X 1 month  2. LOCATION: "What part of the leg is swollen?"  "Are both legs swollen or just one leg?"     Both leg- left leg bigger- aove ankle 3. SEVERITY: "How bad is the swelling?" (e.g., localized; mild, moderate, severe)   - Localized: Small area of swelling localized to one leg.   - MILD pedal edema: Swelling limited to foot and ankle, pitting edema < 1/4 inch (6 mm) deep, rest and elevation eliminate most or all swelling.   - MODERATE edema: Swelling of lower leg to knee, pitting edema > 1/4 inch (6 mm) deep, rest and elevation only partially reduce swelling.   - SEVERE edema: Swelling extends above knee, facial or hand swelling present.      mild 4. REDNESS: "Does the swelling look red or infected?"     no 5. PAIN: "Is the swelling painful to touch?" If Yes, ask: "How painful is it?"   (Scale 1-10; mild, moderate or severe)     Yes- mild 3 6. FEVER: "Do you have a fever?" If Yes, ask: "What is it, how was it measured, and when did it start?"      no 7. CAUSE: "What do you think is causing the leg swelling?"     unknown 8. MEDICAL HISTORY: "Do you have a history of blood clots (e.g., DVT), cancer, heart failure, kidney disease, or liver failure?"     no 9. RECURRENT SYMPTOM: "Have you had leg swelling before?" If Yes, ask:  "When was the last time?" "What happened that time?"    Yes - martial art 10. OTHER SYMPTOMS: "Do you have any other symptoms?" (e.g., chest pain, difficulty breathing)       Bruising noted 11. PREGNANCY: "Is there any chance you are pregnant?" "When was your last menstrual period?"       N/a  Protocols used: Leg Swelling and Edema-A-AH

## 2022-08-23 NOTE — Progress Notes (Signed)
Acute Office Visit  Subjective:     Patient ID: Carl Burke, male    DOB: November 20, 1956, 66 y.o.   MRN: [id number]  Chief Complaint  Patient presents with   Edema    Bilateral feet and ankles x1 month    HPI Patient is in today for swelling in his legs and ankles for the last month and a half.  Swelling is nonpitting.  Appears to be worse with activity.  He denies pain in the feet but does have changes/loss of sensation with occasional numbness and tingling.  He denies any skin changes.  He denies any medication changes recently.  He does note that are more pale in color around his nails are darker in color.  He denies shortness of breath.  Review of Systems  Constitutional:  Negative for chills and fever.  Respiratory:  Negative for shortness of breath.   Cardiovascular:  Positive for leg swelling. Negative for chest pain and claudication.  Skin:  Negative for itching and rash.  Neurological:  Positive for tingling and sensory change. Negative for weakness.        Objective:    BP 132/64   Pulse 88   Temp 98.1 F (36.7 C)   Resp 18   Ht '5\' 11"'$  (1.803 m)   Wt 280 lb 9.6 oz (127.3 kg)   SpO2 96%   BMI 39.14 kg/m  BP Readings from Last 3 Encounters:  08/23/22 132/64  06/30/22 120/70  04/04/22 126/68   Wt Readings from Last 3 Encounters:  08/23/22 280 lb 9.6 oz (127.3 kg)  06/30/22 285 lb (129.3 kg)  04/04/22 274 lb (124.3 kg)      Physical Exam Constitutional:      Appearance: Normal appearance. He is obese.  HENT:     Head: Normocephalic and atraumatic.  Eyes:     Conjunctiva/sclera: Conjunctivae normal.  Cardiovascular:     Rate and Rhythm: Normal rate and regular rhythm.     Pulses:          Dorsalis pedis pulses are 2+ on the right side and 2+ on the left side.  Pulmonary:     Effort: Pulmonary effort is normal.     Breath sounds: Normal breath sounds.  Musculoskeletal:        General: Swelling present. No tenderness.     Right foot: Normal range  of motion. No deformity, bunion, Charcot foot, foot drop or prominent metatarsal heads.     Left foot: Normal range of motion. No deformity, bunion, Charcot foot, foot drop or prominent metatarsal heads.     Comments: Bilateral lower extremity nonpitting edema, symmetric  Feet:     Right foot:     Protective Sensation: 6 sites tested.  5 sites sensed.     Skin integrity: Skin integrity normal.     Toenail Condition: Fungal disease present.    Left foot:     Protective Sensation: 6 sites tested.  5 sites sensed.     Skin integrity: Skin integrity normal.     Toenail Condition: Fungal disease present. Skin:    General: Skin is warm and dry.     Capillary Refill: Capillary refill takes 2 to 3 seconds.     Comments: Fungal disease in bilateral great toenails, pale toenails on other digits.  Neurological:     General: No focal deficit present.     Mental Status: He is alert. Mental status is at baseline.  Psychiatric:  Mood and Affect: Mood normal.        Behavior: Behavior normal.     No results found for any visits on 08/23/22.      Assessment & Plan:   1. Swelling of lower extremity: Mild bilateral lower extremity edema is symmetric on both sides.  Slight decrease sensation bilaterally as well.  We will obtain some labs to rule out metabolic disease that can contribute to neuropathy.  No signs of infection, no skin changes, edema is nonpitting, pulses are appropriate.  Swelling most likely secondary to obesity and is gravity dependent.  Follow-up if symptoms worsen or fail to improve.  - TSH - B12 and Folate Panel - B Nat Peptide - Vitamin D (25 hydroxy) - Comprehensive Metabolic Panel (CMET)  Return if symptoms worsen or fail to improve.  Teodora Medici, DO

## 2022-08-23 NOTE — Patient Instructions (Addendum)
It was great seeing you today!  Plan discussed at today's visit: -Blood work ordered today, results will be uploaded to [facility].   Follow up in: as needed pending on labs  Take care and let us know if you have any questions or concerns prior to your next visit.  Dr. Rosana Berger   Edema  Edema is an abnormal buildup of fluids in the body tissues and under the skin. Swelling of the legs, feet, and ankles is a common symptom that becomes more likely as you get older. Swelling is also common in looser tissues, such as around the eyes. Pressing on the area may make a temporary dent in your skin (pitting edema). This fluid may also accumulate in your lungs (pulmonary edema). There are many possible causes of edema. Eating too much salt (sodium) and being on your feet or sitting for a long time can cause edema in your legs, feet, and ankles. Common causes of edema include: Certain medical conditions, such as heart failure, liver or kidney disease, and cancer. Weak leg blood vessels. An injury. Pregnancy. Medicines. Being obese. Low protein levels in the blood. Hot weather may make edema worse. Edema is usually painless. Your skin may look swollen or shiny. Follow these instructions at home: Medicines Take over-the-counter and prescription medicines only as told by your health care provider. Your health care provider may prescribe a medicine to help your body get rid of extra water (diuretic). Take this medicine if you are told to take it. Eating and drinking Eat a low-salt (low-sodium) diet to reduce fluid as told by your health care provider. Sometimes, eating less salt may reduce swelling. Depending on the cause of your swelling, you may need to limit how much fluid you drink (fluid restriction). General instructions Raise (elevate) the injured area above the level of your heart while you are sitting or lying down. Do not sit still or stand for long periods of time. Do not wear tight  clothing. Do not wear garters on your upper legs. Exercise your legs to get your circulation going. This helps to move the fluid back into your blood vessels, and it may help the swelling go down. Wear compression stockings as told by your health care provider. These stockings help to prevent blood clots and reduce swelling in your legs. It is important that these are the correct size. These stockings should be prescribed by your health care provider to prevent possible injuries. If elastic bandages or wraps are recommended, use them as told by your health care provider. Contact a health care provider if: Your edema does not get better with treatment. You have heart, liver, or kidney disease and have symptoms of edema. You have sudden and unexplained weight gain. Get help right away if: You develop shortness of breath or chest pain. You cannot breathe when you lie down. You develop pain, redness, or warmth in the swollen areas. You have heart, liver, or kidney disease and suddenly get edema. You have a fever and your symptoms suddenly get worse. These symptoms may be an emergency. Get help right away. Call 911. Do not wait to see if the symptoms will go away. Do not drive yourself to the hospital. Summary Edema is an abnormal buildup of fluids in the body tissues and under the skin. Eating too much salt (sodium)and being on your feet or sitting for a long time can cause edema in your legs, feet, and ankles. Raise (elevate) the injured area above the level of your  heart while you are sitting or lying down. Follow your health care provider's instructions about diet and how much fluid you can drink. This information is not intended to replace advice given to you by your health care provider. Make sure you discuss any questions you have with your health care provider. Document Revised: 08/02/2021 Document Reviewed: 08/02/2021 Elsevier Patient Education  [place].

## 2022-08-24 LAB — B12 AND FOLATE PANEL
Folate: 14 ng/mL (ref >3.0)
Vitamin B-12: 387 pg/mL (ref 232–1245)

## 2022-08-24 LAB — COMPREHENSIVE METABOLIC PANEL
ALT: 42 IU/L (ref 0–44)
AST: 23 IU/L (ref 0–40)
Albumin/Globulin Ratio: 1.7 (ref 1.2–2.2)
Albumin: 4.3 g/dL (ref 3.9–4.9)
Alkaline Phosphatase: 80 IU/L (ref 44–121)
BUN/Creatinine Ratio: 16 (ref 10–24)
BUN: 11 mg/dL (ref 8–27)
Bilirubin Total: 0.2 mg/dL (ref 0.0–1.2)
CO2: 22 mmol/L (ref 20–29)
Calcium: 9.3 mg/dL (ref 8.6–10.2)
Chloride: 102 mmol/L (ref 96–106)
Creatinine, Ser: 0.67 mg/dL — ABNORMAL LOW (ref 0.76–1.27)
Globulin, Total: 2.5 g/dL (ref 1.5–4.5)
Glucose: 105 mg/dL — ABNORMAL HIGH (ref 70–99)
Potassium: 4.2 mmol/L (ref 3.5–5.2)
Sodium: 140 mmol/L (ref 134–144)
Total Protein: 6.8 g/dL (ref 6.0–8.5)
eGFR: 104 mL/min/{1.73_m2} (ref >59)

## 2022-08-24 LAB — BRAIN NATRIURETIC PEPTIDE: BNP: 6.1 pg/mL (ref 0.0–100.0)

## 2022-08-24 LAB — TSH: TSH: 2 u[IU]/mL (ref 0.450–4.500)

## 2022-08-24 LAB — VITAMIN D 25 HYDROXY (VIT D DEFICIENCY, FRACTURES): Vit D, 25-Hydroxy: 26.2 ng/mL — ABNORMAL LOW (ref 30.0–100.0)

## 2022-08-25 MED ORDER — VITAMIN D (ERGOCALCIFEROL) 1.25 MG (50000 UNIT) PO CAPS: 50000.0000 [IU] | ORAL_CAPSULE | ORAL | 0 refills | Status: DC

## 2022-08-25 NOTE — Addendum Note (Signed)
Addended by: Teodora Medici on: 08/25/2022 07:52 AM   Modules accepted: Orders

## 2022-09-01 ENCOUNTER — Other Ambulatory Visit: Payer: Self-pay | Admitting: Family Medicine

## 2022-09-01 DIAGNOSIS — G4700 Insomnia, unspecified: Secondary | ICD-10-CM

## 2022-09-01 DIAGNOSIS — M62830 Muscle spasm of back: Secondary | ICD-10-CM

## 2022-09-01 NOTE — Telephone Encounter (Signed)
Requested medication (s) are due for refill today: yes  Requested medication (s) are on the active medication list: yes  Last refill:  05/16/22 for both rx  (90 DS)  Future visit scheduled: yes  Notes to clinic:  Unable to refill per protocol, cannot delegate. Both last by Dr. Ky Barban      Requested Prescriptions  Pending Prescriptions Disp Refills   tiZANidine (ZANAFLEX) 4 MG tablet [Pharmacy Med Name: tiZANidine HCl 4 MG Oral Tablet] 270 tablet 0    Sig: TAKE 1 TABLET BY MOUTH 3 TIMES  DAILY AS NEEDED FOR MUSCLE  SPASM(S)     Not Delegated - Cardiovascular:  Alpha-2 Agonists - tizanidine Failed - 09/01/2022  1:40 PM      Failed - This refill cannot be delegated      Passed - Valid encounter within last 6 months    Recent Outpatient Visits           1 week ago Swelling of lower extremity   [facility] Teodora Medici, DO   2 months ago History of alcoholism [facility])   [facility] [place], Drue Stager, MD   5 months ago COPD with acute exacerbation [facility])   [facility] Steele Sizer, MD   8 months ago Essential hypertension   [facility] Steele Sizer, MD   1 year ago Morbid obesity [facility])   [facility] Steele Sizer, MD       Future Appointments             In 1 month Ancil Boozer, Drue Stager, MD [facility], [place]   In 7 months Steele Sizer, MD [facility], [facility]             zolpidem (AMBIEN CR) 6.25 MG CR tablet [Pharmacy Med Name: ZOLPIDEM  6.'25MG'$   TAB  EXTENDED RELEASE] 90 tablet     Sig: TAKE 1 TABLET BY MOUTH AT  BEDTIME AS NEEDED FOR SLEEP     Not Delegated - Psychiatry:  Anxiolytics/Hypnotics Failed - 09/01/2022  1:40 PM      Failed - This refill cannot be delegated      Failed - Urine Drug Screen completed in last 360 days      Passed - Valid encounter within last 6 months    Recent Outpatient Visits           1 week ago  Swelling of lower extremity   [place], [place]   2 months ago History of alcoholism [facility])   [facility] Steele Sizer, MD   5 months ago COPD with acute exacerbation [facility])   [facility] Steele Sizer, MD   8 months ago Essential hypertension   [facility] Steele Sizer, MD   1 year ago Morbid obesity [facility])   [facility] Steele Sizer, MD       Future Appointments             In 1 month Ancil Boozer, Drue Stager, MD [facility], [place]   In 7 months Steele Sizer, MD [facility], [facility]

## 2022-10-05 LAB — HM DIABETES EYE EXAM

## 2022-10-10 NOTE — Progress Notes (Unsigned)
Name: Carl Burke   MRN: [id number]    DOB: 03-30-1956   Date:10/11/2022       Progress Note  Subjective  Chief Complaint  Follow up   HPI  Chronic bronchitis: he has been smoking since age 66, he states initially just a few cigarettes, in his 30's and 45 's he smoked up to 3 packs per day. 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 BP is at goal today.   Leucocytosis: it goes up and down, he has seen hematologist . WBC is stable last level 11.3    Hyperglycemia: he denies polyphagia, polydipsia or polyuria. A1C went from 6.5 % down to 5.7 % and today is 6.4 % , he is very close to diabetes, discussed starting him on medications but he prefers holding off at this time    Chronic back pain, neck pain, knee pain radiculitis/also hands: used to take narcotics , seen by pain clinic, but he states medications did not help so he stopped everything , only on Zanaflex three times a day. Pain right now is 7/10 ( because he was more active yesterday )  but on average it is 5/10 . He has intermittent tingling on both thumbs and index finger    Dyslipidemia: taking Atorvastatin  but off  Vascepa because of cost, last LDL is 97 We will keep his LDL below 100    Morbid obesity : BMI above 35 with co-morbidies , such as HTN, chronic back pain, dyslipidemia.  Weight has been stable.    Umbilical hernia: hernia has been present for many years, he saw surgeon, he is wearing a compression vest . Not ready for surgery yet    Insomnia: chronic, takes Ambien every other night and it helps him fall and stay asleep. He gets 90 days but lasts about 5-6 months. He states it helps with his  sleep about half the time, but does not want to change . He does not need refills at this time    History of alcoholism: he states used to drink a bottle of hard liquor per night in his 71's and 56 's he had 4 DUI's in his life time, but states now only drinks occasionally and no more than one shot of hard liquor and sometimes a beer , only when he skips his sleeping medications.   Perennial AR: he is taking loratadine,he continues to have nasal congestion. He stopped taking nasal spray but willing to resume it   Dysthymia/Seasonal affective disorder: he states he would like to work but cannot due to his joint problems, he gets frustrated about not being able to make as much money. He does not want medications, he has worsening of symptoms during winter months  He states I only take the medications my wife makes me take it  Senile purpura: on arms, reassurance given Unchanged    Patient Active Problem List   Diagnosis Date Noted   Senile purpura ([place]) 04/04/2022   History of alcoholism ([place]) 04/04/2022   Seasonal affective disorder ([place]) 01/19/2021   Dyslipidemia 69/48/5462   Umbilical hernia without obstruction and without gangrene  07/17/2019   Pre-diabetes 01/07/2019   Degeneration of lumbosacral intervertebral disc 03/28/2018   Full thickness rotator cuff tear 03/28/2018   Neck pain 03/28/2018   Hyperlipidemia 12/20/2016   Leukocytosis 07/17/2016   Chronic shoulder pain (Right) 04/12/2016   Cervical spondylosis 04/12/2016   Chronic upper back pain (Location of Secondary source of pain) (Bilateral) (midline) 04/12/2016   Essential hypertension 04/12/2016   Lumbosacral radiculopathy at S1 (Left) 04/12/2016   Rotator cuff arthropathy of right shoulder 08/15/2013    Past Surgical History:  Procedure Laterality Date   COLONOSCOPY     COLONOSCOPY WITH PROPOFOL N/A 08/23/2018   Procedure: COLONOSCOPY WITH PROPOFOL;  Surgeon: Lin Landsman, MD;  Location: [facility] ENDOSCOPY;  Service:  Gastroenterology;  Laterality: N/A;   ESOPHAGOGASTRODUODENOSCOPY N/A 07/19/2016   Procedure: ESOPHAGOGASTRODUODENOSCOPY (EGD);  Surgeon: Gatha Mayer, MD;  Location[facility] ENDOSCOPY;  Service: Endoscopy;  Laterality: N/A;   ESOPHAGOGASTRODUODENOSCOPY (EGD) WITH PROPOFOL N/A 08/23/2018   Procedure: ESOPHAGOGASTRODUODENOSCOPY (EGD) WITH PROPOFOL;  Surgeon: Lin Landsman, MD;  Location: [facility] ENDOSCOPY;  Service: Gastroenterology;  Laterality: N/A;   ROTATOR CUFF REPAIR Left 2011   ROTATOR CUFF REPAIR Right 2013    Family History  Problem Relation Age of Onset   Heart disease Mother    Colon cancer Father    Cancer Sister    Breast cancer Sister 18   Cancer Sister     Social History   Tobacco Use   Smoking status: Former    Packs/day: 0.50    Years: 50.00    Total pack years: 25.00    Types: Cigarettes    Start date: 09/30/1970    Quit date: 03/12/2022    Years since quitting: 0.5   Smokeless tobacco: Never  Substance Use Topics   Alcohol use: Yes    Alcohol/week: 0.0 standard drinks of alcohol    Comment: Rare/occassional      Current Outpatient Medications:    amLODipine-benazepril (LOTREL) 5-20 MG capsule, Take 1 capsule by mouth daily., Disp: 90 capsule, Rfl: 1   atorvastatin (LIPITOR) 40 MG tablet, Take 1 tablet (40 mg total) by mouth daily., Disp: 90 tablet, Rfl: 3   azelastine (ASTELIN) 0.1 % nasal spray, Place 1 spray into both nostrils 2 (two) times daily. Use in each nostril as directed, Disp: 90 mL, Rfl: 1   Fluticasone-Umeclidin-Vilant (TRELEGY ELLIPTA) 100-62.5-25 MCG/ACT AEPB, Inhale 1 puff into the lungs daily., Disp: 1 each, Rfl: 5   Ibuprofen 200 MG CAPS, Take by mouth., Disp: , Rfl:    zolpidem (AMBIEN CR) 6.25 MG CR tablet, TAKE 1 TABLET BY MOUTH AT  BEDTIME AS NEEDED FOR SLEEP, Disp: 90 tablet, Rfl: 0   loratadine (CLARITIN) 10 MG tablet, Take 1 tablet (10 mg total) by mouth 2 (two) times daily., Disp: 180 tablet, Rfl: 1   tiZANidine (ZANAFLEX) 4 MG  tablet, Take 1 tablet (4 mg total) by mouth 3 (three) times daily., Disp: 270 tablet, Rfl: 1   Vitamin D, Ergocalciferol, (DRISDOL) 1.25 MG (50000 UNIT) CAPS capsule, Take 1 capsule (50,000 Units total) by mouth every 7 (seven) days., Disp: 12 capsule, Rfl: 0  Allergies  Allergen Reactions   Naproxen Sodium Rash and Other (See Comments)    800 mg tablets that the MD prescribed for pain.    I personally reviewed active problem list, medication list, allergies, family history, social history with the patient/caregiver today.   ROS  Constitutional: Negative for fever or weight change.  Respiratory: Negative for cough and shortness of  breath.   Cardiovascular: Negative for chest pain or palpitations.  Gastrointestinal: Negative for abdominal pain, no bowel changes.  Musculoskeletal: Negative for gait problem, positive for  joint swelling.  Skin: Negative for rash.  Neurological: Negative for dizziness or headache.  No other specific complaints in a complete review of systems (except as listed in HPI above).   Objective  Vitals:   10/11/22 0825  BP: 130/70  Pulse: 82  Resp: 16  Temp: 98.1 F (36.7 C)  TempSrc: Oral  SpO2: 92%  Weight: 286 lb 1.6 oz (129.8 kg)  Height: 6' 0.25" (1.835 m)    Body mass index is 38.53 kg/m.  Physical Exam  Constitutional: Patient appears well-developed and well-nourished. Obese  No distress.  HEENT: head atraumatic, normocephalic, pupils equal and reactive to light, neck supple Cardiovascular: Normal rate, regular rhythm and normal heart sounds.  No murmur heard. No BLE edema. Pulmonary/Chest: Effort normal and breath sounds normal. No respiratory distress. Abdominal: Soft.  There is no tenderness. Muscular skeletal: swollen DIP, PIP on both hands Psychiatric: Patient has a normal mood and affect. behavior is normal. Judgment and thought content normal.   Recent Results (from the past 2160 hour(s))  Lipid panel     Status: Abnormal    Collection Time: 07/25/22  9:27 AM  Result Value Ref Range   Cholesterol, Total 175 100 - 199 mg/dL   Triglycerides 244 (H) 0 - 149 mg/dL   HDL 36 (L) >39 mg/dL   VLDL Cholesterol Cal 42 (H) 5 - 40 mg/dL   LDL Chol Calc (NIH) 97 0 - 99 mg/dL   Chol/HDL Ratio 4.9 0.0 - 5.0 ratio    Comment:                                   T. Chol/HDL Ratio                                             Men  Women                               1/2 Avg.Risk  3.4    3.3                                   Avg.Risk  5.0    4.4                                2X Avg.Risk  9.6    7.1                                3X Avg.Risk 23.4   11.0   CBC with Differential/Platelet     Status: Abnormal   Collection Time: 07/25/22  9:27 AM  Result Value Ref Range   WBC 11.3 (H) 3.4 - 10.8 x10E3/uL   RBC 5.38 4.14 - 5.80 x10E6/uL   Hemoglobin 16.6 13.0 - 17.7 g/dL   Hematocrit 48.6 37.5 - 51.0 %   MCV 90 79 - 97 fL   MCH 30.9 26.6 - 33.0 pg  MCHC 34.2 31.5 - 35.7 g/dL   RDW 13.8 11.6 - 15.4 %   Platelets 257 150 - 450 x10E3/uL   Neutrophils 60 Not Estab. %   Lymphs 29 Not Estab. %   Monocytes 8 Not Estab. %   Eos 2 Not Estab. %   Basos 1 Not Estab. %   Neutrophils Absolute 6.8 1.4 - 7.0 x10E3/uL   Lymphocytes Absolute 3.3 (H) 0.7 - 3.1 x10E3/uL   Monocytes Absolute 0.8 0.1 - 0.9 x10E3/uL   EOS (ABSOLUTE) 0.2 0.0 - 0.4 x10E3/uL   Basophils Absolute 0.1 0.0 - 0.2 x10E3/uL   Immature Granulocytes 0 Not Estab. %   Immature Grans (Abs) 0.0 0.0 - 0.1 x10E3/uL  Comprehensive metabolic panel     Status: Abnormal   Collection Time: 07/25/22  9:27 AM  Result Value Ref Range   Glucose 106 (H) 70 - 99 mg/dL   BUN 8 8 - 27 mg/dL   Creatinine, Ser 0.63 (L) 0.76 - 1.27 mg/dL   eGFR 106 >59 mL/min/1.73   BUN/Creatinine Ratio 13 10 - 24   Sodium 138 134 - 144 mmol/L   Potassium 4.4 3.5 - 5.2 mmol/L   Chloride 101 96 - 106 mmol/L   CO2 20 20 - 29 mmol/L   Calcium 9.1 8.6 - 10.2 mg/dL   Total Protein 6.4 6.0 - 8.5 g/dL    Albumin 4.1 3.9 - 4.9 g/dL   Globulin, Total 2.3 1.5 - 4.5 g/dL   Albumin/Globulin Ratio 1.8 1.2 - 2.2   Bilirubin Total 0.4 0.0 - 1.2 mg/dL   Alkaline Phosphatase 78 44 - 121 IU/L   AST 29 0 - 40 IU/L   ALT 50 (H) 0 - 44 IU/L  Hemoglobin A1c     Status: Abnormal   Collection Time: 07/25/22  9:27 AM  Result Value Ref Range   Hgb A1c MFr Bld 6.4 (H) 4.8 - 5.6 %    Comment:          Prediabetes: 5.7 - 6.4          Diabetes: >6.4          Glycemic control for adults with diabetes: <7.0    Est. average glucose Bld gHb Est-mCnc 137 mg/dL  TSH     Status: None   Collection Time: 08/23/22  2:37 PM  Result Value Ref Range   TSH 2.000 0.450 - 4.500 uIU/mL  B12 and Folate Panel     Status: None   Collection Time: 08/23/22  2:37 PM  Result Value Ref Range   Vitamin B-12 387 232 - 1,245 pg/mL   Folate 14.0 >3.0 ng/mL    Comment: A serum folate concentration of less than 3.1 ng/mL is considered to represent clinical deficiency.   B Nat Peptide     Status: None   Collection Time: 08/23/22  2:37 PM  Result Value Ref Range   BNP 6.1 0.0 - 100.0 pg/mL    Comment: Siemens ADVIA Centaur XP methodology  Vitamin D (25 hydroxy)     Status: Abnormal   Collection Time: 08/23/22  2:37 PM  Result Value Ref Range   Vit D, 25-Hydroxy 26.2 (L) 30.0 - 100.0 ng/mL    Comment: Vitamin D deficiency has been defined by the [place] practice guideline as a level of serum 25-OH vitamin D less than 20 ng/mL (1,2). The Endocrine Society went on to further define vitamin D insufficiency as a level between 21 and 29 ng/mL (  2). 1. IOM (Institute of Medicine). 2010. Dietary reference    intakes for calcium and D. [place]: The    [employer]. 2. Holick MF, Binkley Graettinger, Bischoff-Ferrari HA, et al.    Evaluation, treatment, and prevention of vitamin D    deficiency: an Endocrine Society clinical practice    guideline. JCEM. 2011 Jul; 96(7):1911-30.    Comprehensive Metabolic Panel (CMET)     Status: Abnormal   Collection Time: 08/23/22  2:46 PM  Result Value Ref Range   Glucose 105 (H) 70 - 99 mg/dL   BUN 11 8 - 27 mg/dL   Creatinine, Ser 0.67 (L) 0.76 - 1.27 mg/dL   eGFR 104 >59 mL/min/1.73   BUN/Creatinine Ratio 16 10 - 24   Sodium 140 134 - 144 mmol/L   Potassium 4.2 3.5 - 5.2 mmol/L   Chloride 102 96 - 106 mmol/L   CO2 22 20 - 29 mmol/L   Calcium 9.3 8.6 - 10.2 mg/dL   Total Protein 6.8 6.0 - 8.5 g/dL   Albumin 4.3 3.9 - 4.9 g/dL   Globulin, Total 2.5 1.5 - 4.5 g/dL   Albumin/Globulin Ratio 1.7 1.2 - 2.2   Bilirubin Total 0.2 0.0 - 1.2 mg/dL   Alkaline Phosphatase 80 44 - 121 IU/L   AST 23 0 - 40 IU/L   ALT 42 0 - 44 IU/L     PHQ2/9:    10/11/2022    8:27 AM 08/23/2022    1:57 PM 06/30/2022    8:34 AM 04/04/2022    2:56 PM 12/31/2021    8:31 AM  Depression screen PHQ 2/9  Decreased Interest 0 0 2 0 0  Down, Depressed, Hopeless 0 0 0 0 0  PHQ - 2 Score 0 0 2 0 0  Altered sleeping 0 0 3 0 0  Tired, decreased energy 0 0 3 0 0  Change in appetite 0 0 0 0 0  Feeling bad or failure about yourself  0 0 1 0 0  Trouble concentrating 0 0 0 0 0  Moving slowly or fidgety/restless 0 0 0 0 0  Suicidal thoughts 0 0 0 0 0  PHQ-9 Score 0 0 9 0 0  Difficult doing work/chores  Not difficult at all       phq 9 is negative   Fall Risk:    10/11/2022    8:27 AM 08/23/2022    1:57 PM 06/30/2022    8:34 AM 04/04/2022    2:56 PM 12/31/2021    8:31 AM  Fall Risk   Falls in the past year? 1 1 0 0 1  Number falls in past yr: 1 0 0 0 1  Injury with Fall? 0 1 0 0 1  Risk for fall due to : History of fall(s)  No Fall Risks No Fall Risks No Fall Risks  Follow up Falls prevention discussed;Education provided;Falls evaluation completed  Falls prevention discussed Falls prevention discussed Falls prevention discussed     Functional Status Survey: Is the patient deaf or have difficulty hearing?: Yes Does the patient have difficulty  seeing, even when wearing glasses/contacts?: No Does the patient have difficulty concentrating, remembering, or making decisions?: No Does the patient have difficulty walking or climbing stairs?: No Does the patient have difficulty dressing or bathing?: No Does the patient have difficulty doing errands alone such as visiting a doctor's office or shopping?: No    Assessment & Plan  1. Simple chronic bronchitis (HCC)  Continue Trelegy  2. Seasonal affective disorder ([place])  Refuses medication  3. Senile purpura ([place])  Reassurance given   4. Vitamin D deficiency  - Vitamin D, Ergocalciferol, (DRISDOL) 1.25 MG (50000 UNIT) CAPS capsule; Take 1 capsule (50,000 Units total) by mouth every 7 (seven) days.  Dispense: 12 capsule; Refill: 0  5. Back muscle spasm  - tiZANidine (ZANAFLEX) 4 MG tablet; Take 1 tablet (4 mg total) by mouth 3 (three) times daily.  Dispense: 270 tablet; Refill: 1  6. Perennial allergic rhinitis with seasonal variation  - loratadine (CLARITIN) 10 MG tablet; Take 1 tablet (10 mg total) by mouth 2 (two) times daily.  Dispense: 180 tablet; Refill: 1 - azelastine (ASTELIN) 0.1 % nasal spray; Place 1 spray into both nostrils 2 (two) times daily. Use in each nostril as directed  Dispense: 90 mL; Refill: 1  7. History of alcoholism ([place])  He states doing well   8. Morbid obesity ([place])  Discussed with the patient the risk posed by an increased BMI. Discussed importance of portion control, calorie counting and at least 150 minutes of physical activity weekly. Avoid sweet beverages and drink more water. Eat at least 6 servings of fruit and vegetables daily    9. Chronic pain syndrome  - tiZANidine (ZANAFLEX) 4 MG tablet; Take 1 tablet (4 mg total) by mouth 3 (three) times daily.  Dispense: 270 tablet; Refill: 1  10. Essential hypertension   At goal

## 2022-10-11 ENCOUNTER — Ambulatory Visit: Payer: Managed Care, Other (non HMO) | Admitting: Family Medicine

## 2022-10-11 ENCOUNTER — Encounter: Payer: Self-pay | Admitting: Family Medicine

## 2022-10-11 VITALS — BP 130/70 | HR 82 | Temp 98.1°F | Resp 16 | Ht 72.25 in | Wt 286.1 lb

## 2022-10-11 DIAGNOSIS — J3089 Other allergic rhinitis: Secondary | ICD-10-CM

## 2022-10-11 DIAGNOSIS — J410 Simple chronic bronchitis: Secondary | ICD-10-CM | POA: Diagnosis not present

## 2022-10-11 DIAGNOSIS — I10 Essential (primary) hypertension: Secondary | ICD-10-CM

## 2022-10-11 DIAGNOSIS — E559 Vitamin D deficiency, unspecified: Secondary | ICD-10-CM | POA: Diagnosis not present

## 2022-10-11 DIAGNOSIS — M62830 Muscle spasm of back: Secondary | ICD-10-CM

## 2022-10-11 DIAGNOSIS — F1021 Alcohol dependence, in remission: Secondary | ICD-10-CM

## 2022-10-11 DIAGNOSIS — F338 Other recurrent depressive disorders: Secondary | ICD-10-CM | POA: Diagnosis not present

## 2022-10-11 DIAGNOSIS — G894 Chronic pain syndrome: Secondary | ICD-10-CM

## 2022-10-11 DIAGNOSIS — D692 Other nonthrombocytopenic purpura: Secondary | ICD-10-CM

## 2022-10-11 DIAGNOSIS — J302 Other seasonal allergic rhinitis: Secondary | ICD-10-CM

## 2022-10-11 MED ORDER — LORATADINE 10 MG PO TABS: 10.0000 mg | ORAL_TABLET | Freq: Two times a day (BID) | ORAL | 1 refills | Status: DC

## 2022-10-11 MED ORDER — TIZANIDINE HCL 4 MG PO TABS: 4.0000 mg | ORAL_TABLET | Freq: Three times a day (TID) | ORAL | 1 refills | Status: DC

## 2022-10-11 MED ORDER — AZELASTINE HCL 0.1 % NA SOLN: 1.0000 | Freq: Two times a day (BID) | NASAL | 1 refills | Status: DC

## 2022-10-11 MED ORDER — VITAMIN D (ERGOCALCIFEROL) 1.25 MG (50000 UNIT) PO CAPS: 50000.0000 [IU] | ORAL_CAPSULE | ORAL | 0 refills | Status: DC

## 2022-12-19 ENCOUNTER — Other Ambulatory Visit: Payer: Self-pay | Admitting: Family Medicine

## 2022-12-19 DIAGNOSIS — I10 Essential (primary) hypertension: Secondary | ICD-10-CM

## 2022-12-24 ENCOUNTER — Other Ambulatory Visit: Payer: Self-pay | Admitting: Family Medicine

## 2022-12-24 DIAGNOSIS — E559 Vitamin D deficiency, unspecified: Secondary | ICD-10-CM

## 2022-12-26 ENCOUNTER — Other Ambulatory Visit: Payer: Self-pay

## 2022-12-26 DIAGNOSIS — E559 Vitamin D deficiency, unspecified: Secondary | ICD-10-CM

## 2023-01-10 NOTE — Progress Notes (Unsigned)
Name: Carl Burke   MRN: [id number]    DOB: October 02, 1956   Date:01/11/2023       Progress Note  Subjective  Chief Complaint  Follow Up  HPI  Chronic bronchitis: he has been smoking since age 67, he states initially just a few cigarettes, in his 70's and 31 's he smoked up to 3 packs per day. 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 Continue medications   Leucocytosis: it goes up and down, he has seen hematologist . WBC is stable last level 11.3    Hyperglycemia: he denies polyphagia, polydipsia or polyuria. A1C went from 6.5 % down to 5.7 % and today is 6.4 % but he does not want to try medications at this time    Chronic back pain, neck pain, knee pain radiculitis/also hands: used to take narcotics , seen by pain clinic, but he states medications did not help so he stopped everything , only on Zanaflex three times a day. Pain right now is 5/10, average pain lately has been 4/10 , pain can go up to 9/10    Dyslipidemia: taking Atorvastatin  but off  Vascepa because of cost, last LDL is 97 Continue medication.    Morbid obesity : BMI above 35 with co-morbidies , such as HTN, chronic back pain, dyslipidemia.  Weight has been stable since last year    Umbilical hernia: hernia has been present for many years, he saw surgeon, he is wearing a compression vest .  Still not ready for surgery    Insomnia: chronic, takes Ambien every other night and it helps him fall and stay asleep. He gets 90 days but lasts about 5-6 months. He states it helps with his sleep about half the  time, but does not want to change .  He would like to continue medication, he states needs to skip doses to avoid side effects    History of alcoholism: he states used to drink a bottle of hard liquor per night in his 30's and 31 's he had 4 DUI's in his life time, but states now only drinks occasionally and no more than two shots of hard liquor and sometimes a beer. Unchanged   Perennial AR: he is taking loratadine and nasal spray again, he goes from congestion to rhinorrhea   Dysthymia/Seasonal affective disorder:phq 9 positive today. He does not want medications, he has worsening of symptoms during winter months  Unchanged   Senile purpura: on arms, reassurance given. Stable   Left gynecomastia: seen by surgeon, had mammogram, he states sometimes the nipple gets painful and hard but that part is intermittent. Discussed referring him back but he would like to hold off for now   Patient Active Problem List   Diagnosis Date Noted   Senile purpura ([place]) 04/04/2022   History of alcoholism ([place]) 04/04/2022   Seasonal affective disorder ([place]) 01/19/2021   Dyslipidemia 20/25/4270   Umbilical hernia without obstruction and without gangrene 07/17/2019   Pre-diabetes 01/07/2019   Degeneration of lumbosacral  intervertebral disc 03/28/2018   Full thickness rotator cuff tear 03/28/2018   Neck pain 03/28/2018   Hyperlipidemia 12/20/2016   Leukocytosis 07/17/2016   Chronic shoulder pain (Right) 04/12/2016   Cervical spondylosis 04/12/2016   Chronic upper back pain (Location of Secondary source of pain) (Bilateral) (midline) 04/12/2016   Essential hypertension 04/12/2016   Lumbosacral radiculopathy at S1 (Left) 04/12/2016   Rotator cuff arthropathy of right shoulder 08/15/2013    Past Surgical History:  Procedure Laterality Date   COLONOSCOPY     COLONOSCOPY WITH PROPOFOL N/A 08/23/2018   Procedure: COLONOSCOPY WITH PROPOFOL;  Surgeon: Lin Landsman, MD;  Location: [facility] ENDOSCOPY;   Service: Gastroenterology;  Laterality: N/A;   ESOPHAGOGASTRODUODENOSCOPY N/A 07/19/2016   Procedure: ESOPHAGOGASTRODUODENOSCOPY (EGD);  Surgeon: Gatha Mayer, MD;  Location: [facility] ENDOSCOPY;  Service: Endoscopy;  Laterality: N/A;   ESOPHAGOGASTRODUODENOSCOPY (EGD) WITH PROPOFOL N/A 08/23/2018   Procedure: ESOPHAGOGASTRODUODENOSCOPY (EGD) WITH PROPOFOL;  Surgeon: Lin Landsman, MD;  Location: [facility] ENDOSCOPY;  Service: Gastroenterology;  Laterality: N/A;   ROTATOR CUFF REPAIR Left 2011   ROTATOR CUFF REPAIR Right 2013    Family History  Problem Relation Age of Onset   Heart disease Mother    Colon cancer Father    Cancer Sister    Breast cancer Sister 66   Cancer Sister     Social History   Tobacco Use   Smoking status: Former    Packs/day: 0.50    Years: 50.00    Total pack years: 25.00    Types: Cigarettes    Start date: 09/30/1970    Quit date: 03/12/2022    Years since quitting: 0.8   Smokeless tobacco: Never  Substance Use Topics   Alcohol use: Yes    Alcohol/week: 0.0 standard drinks of alcohol    Comment: Rare/occassional      Current Outpatient Medications:    amLODipine-benazepril (LOTREL) 5-20 MG capsule, TAKE 1 CAPSULE BY MOUTH DAILY, Disp: 30 capsule, Rfl: 0   atorvastatin (LIPITOR) 40 MG tablet, Take 1 tablet (40 mg total) by mouth daily., Disp: 90 tablet, Rfl: 3   azelastine (ASTELIN) 0.1 % nasal spray, Place 1 spray into both nostrils 2 (two) times daily. Use in each nostril as directed, Disp: 90 mL, Rfl: 1   Fluticasone-Umeclidin-Vilant (TRELEGY ELLIPTA) 100-62.5-25 MCG/ACT AEPB, Inhale 1 puff into the lungs daily., Disp: 1 each, Rfl: 5   Ibuprofen 200 MG CAPS, Take by mouth., Disp: , Rfl:    loratadine (CLARITIN) 10 MG tablet, Take 1 tablet (10 mg total) by mouth 2 (two) times daily., Disp: 180 tablet, Rfl: 1   tiZANidine (ZANAFLEX) 4 MG tablet, Take 1 tablet (4 mg total) by mouth 3 (three) times daily., Disp: 270 tablet, Rfl: 1   Vitamin D,  Ergocalciferol, (DRISDOL) 1.25 MG (50000 UNIT) CAPS capsule, TAKE 1 CAPSULE BY MOUTH EVERY 7  DAYS, Disp: 12 capsule, Rfl: 3   zolpidem (AMBIEN CR) 6.25 MG CR tablet, TAKE 1 TABLET BY MOUTH AT  BEDTIME AS NEEDED FOR SLEEP, Disp: 90 tablet, Rfl: 0  Allergies  Allergen Reactions   Naproxen Sodium Rash and Other (See Comments)    800 mg tablets that the MD prescribed for pain.    I personally reviewed active problem list, medication list, allergies, family history, social history, health maintenance with the patient/caregiver today.   ROS  Constitutional: Negative for fever or weight change.  Respiratory: Negative for cough and shortness of breath.   Cardiovascular: Negative for chest pain or palpitations.  Gastrointestinal: Negative for abdominal pain, no bowel changes.  Musculoskeletal: Negative for gait problem or joint swelling.  Skin: Negative for rash.  Neurological: Negative for dizziness or headache.  No other specific complaints in a complete review of systems (except as listed in HPI above).   Objective  Vitals:   01/11/23 0823  BP: 126/74  Pulse: 84  Resp: 16  SpO2: 96%  Weight: 285 lb (129.3 kg)  Height: 6' (1.829 m)    Body mass index is 38.65 kg/m.  Physical Exam  Constitutional: Patient appears well-developed and well-nourished. Obese  No distress.  HEENT: head atraumatic, normocephalic, pupils equal and reactive to light, neck supple Cardiovascular: Normal rate, regular rhythm and normal heart sounds.  No murmur heard. No BLE edema. Pulmonary/Chest: Effort normal and breath sounds normal. No respiratory distress. Abdominal: Soft.  There is no tenderness. Muscular skeletal: negative straight leg raise, no pain during palpation of lumbar spine Psychiatric: Patient has a normal mood and affect. behavior is normal. Judgment and thought content normal.    PHQ2/9:    01/11/2023    8:17 AM 10/11/2022    8:27 AM 08/23/2022    1:57 PM 06/30/2022    8:34 AM  04/04/2022    2:56 PM  Depression screen PHQ 2/9  Decreased Interest 1 0 0 2 0  Down, Depressed, Hopeless 1 0 0 0 0  PHQ - 2 Score 2 0 0 2 0  Altered sleeping 0 0 0 3 0  Tired, decreased energy 0 0 0 3 0  Change in appetite 0 0 0 0 0  Feeling bad or failure about yourself  0 0 0 1 0  Trouble concentrating 0 0 0 0 0  Moving slowly or fidgety/restless 0 0 0 0 0  Suicidal thoughts 0 0 0 0 0  PHQ-9 Score 2 0 0 9 0  Difficult doing work/chores   Not difficult at all      phq 9 is positive   Fall Risk:    01/11/2023    8:17 AM 10/11/2022    8:27 AM 08/23/2022    1:57 PM 06/30/2022    8:34 AM 04/04/2022    2:56 PM  Fall Risk   Falls in the past year? 0 1 1 0 0  Number falls in past yr: 0 1 0 0 0  Injury with Fall? 0 0 1 0 0  Risk for fall due to : No Fall Risks History of fall(s)  No Fall Risks No Fall Risks  Follow up Falls prevention discussed Falls prevention discussed;Education provided;Falls evaluation completed  Falls prevention discussed Falls prevention discussed      Functional Status Survey: Is the patient deaf or have difficulty hearing?: No Does the patient have difficulty seeing, even when wearing glasses/contacts?: No Does the patient have difficulty concentrating, remembering, or making decisions?: No Does the patient have difficulty walking or climbing stairs?: Yes Does the patient have difficulty dressing or bathing?: No Does the patient have difficulty doing errands alone such as visiting a doctor's office or shopping?: No    Assessment & Plan  1. Simple chronic bronchitis (HCC)  Using medication prn   2. Morbid obesity ([place])  Discussed with the patient the risk posed by an increased BMI. Discussed importance of portion control, calorie counting and at least 150 minutes of physical activity weekly. Avoid sweet beverages and drink more water. Eat at least 6 servings of fruit and vegetables daily    3. Senile purpura ([place])  Reassurance  given   4.  History of alcoholism ([place])  Still in remission   5. Seasonal affective disorder ([place])  Discussed Duloxetine   6. Mixed hyperlipidemia  - atorvastatin (LIPITOR) 40 MG tablet; Take 1 tablet (40 mg total) by mouth daily.  Dispense: 90 tablet; Refill: 3  7. Insomnia, unspecified type  - zolpidem (AMBIEN CR) 6.25 MG CR tablet; Take 1 tablet (6.25 mg total) by mouth at bedtime as needed. for sleep  Dispense: 90 tablet; Refill: 0  8. Essential hypertension  - amLODipine-benazepril (LOTREL) 5-20 MG capsule; Take 1 capsule by mouth daily.  Dispense: 90 capsule; Refill: 1  9. B12 deficiency  Discussed sublingual medication   10. Pre-diabetes   11. Chronic pain syndrome  Stable   12. Vitamin D deficiency  Continue supplementation   13. Hyperglycemia

## 2023-01-11 ENCOUNTER — Ambulatory Visit: Payer: Managed Care, Other (non HMO) | Admitting: Family Medicine

## 2023-01-11 ENCOUNTER — Encounter: Payer: Self-pay | Admitting: Family Medicine

## 2023-01-11 VITALS — BP 126/74 | HR 84 | Resp 16 | Ht 72.0 in | Wt 285.0 lb

## 2023-01-11 DIAGNOSIS — E559 Vitamin D deficiency, unspecified: Secondary | ICD-10-CM

## 2023-01-11 DIAGNOSIS — F1021 Alcohol dependence, in remission: Secondary | ICD-10-CM | POA: Diagnosis not present

## 2023-01-11 DIAGNOSIS — E538 Deficiency of other specified B group vitamins: Secondary | ICD-10-CM

## 2023-01-11 DIAGNOSIS — E782 Mixed hyperlipidemia: Secondary | ICD-10-CM

## 2023-01-11 DIAGNOSIS — G894 Chronic pain syndrome: Secondary | ICD-10-CM

## 2023-01-11 DIAGNOSIS — J410 Simple chronic bronchitis: Secondary | ICD-10-CM | POA: Insufficient documentation

## 2023-01-11 DIAGNOSIS — D692 Other nonthrombocytopenic purpura: Secondary | ICD-10-CM | POA: Diagnosis not present

## 2023-01-11 DIAGNOSIS — G4700 Insomnia, unspecified: Secondary | ICD-10-CM

## 2023-01-11 DIAGNOSIS — I10 Essential (primary) hypertension: Secondary | ICD-10-CM

## 2023-01-11 DIAGNOSIS — R739 Hyperglycemia, unspecified: Secondary | ICD-10-CM

## 2023-01-11 DIAGNOSIS — R7303 Prediabetes: Secondary | ICD-10-CM

## 2023-01-11 DIAGNOSIS — F338 Other recurrent depressive disorders: Secondary | ICD-10-CM

## 2023-01-11 MED ORDER — AMLODIPINE BESY-BENAZEPRIL HCL 5-20 MG PO CAPS: 1.0000 | ORAL_CAPSULE | Freq: Every day | ORAL | 1 refills | Status: DC

## 2023-01-11 MED ORDER — ATORVASTATIN CALCIUM 40 MG PO TABS: 40.0000 mg | ORAL_TABLET | Freq: Every day | ORAL | 3 refills | Status: DC

## 2023-01-11 MED ORDER — ZOLPIDEM TARTRATE ER 6.25 MG PO TBCR: 6.2500 mg | EXTENDED_RELEASE_TABLET | Freq: Every evening | ORAL | 0 refills | Status: DC | PRN

## 2023-02-17 ENCOUNTER — Other Ambulatory Visit: Payer: Self-pay | Admitting: Family Medicine

## 2023-02-17 DIAGNOSIS — G4700 Insomnia, unspecified: Secondary | ICD-10-CM

## 2023-02-17 DIAGNOSIS — I10 Essential (primary) hypertension: Secondary | ICD-10-CM

## 2023-02-17 DIAGNOSIS — M62830 Muscle spasm of back: Secondary | ICD-10-CM

## 2023-02-17 DIAGNOSIS — E782 Mixed hyperlipidemia: Secondary | ICD-10-CM

## 2023-02-17 DIAGNOSIS — G894 Chronic pain syndrome: Secondary | ICD-10-CM

## 2023-02-17 NOTE — Telephone Encounter (Signed)
Medication Refill - Medication: zolpidem (AMBIEN CR) 6.25 MG CR tablet [id number]   Has the patient contacted their pharmacy? No. (Agent: If no, request that the patient contact the pharmacy for the refill. If patient does not wish to contact the pharmacy document the reason why and proceed with request.) (Agent: If yes, when and what did the pharmacy advise?)  Preferred Pharmacy (with phone number or street name):  [facility] [id number], [place] Phone: [phone]  Fax: [phone]     Has the patient been seen for an appointment in the last year OR does the patient have an upcoming appointment? Yes.    Agent: Please be advised that RX refills may take up to 3 business days. We ask that you follow-up with your pharmacy.

## 2023-02-17 NOTE — Telephone Encounter (Signed)
Requested medication (s) are due for refill today: yes  Requested medication (s) are on the active medication list: yes  Last refill:  01/11/23  Future visit scheduled: yes  Notes to clinic:  Unable to refill per protocol, cannot delegate.      Requested Prescriptions  Pending Prescriptions Disp Refills   zolpidem (AMBIEN CR) 6.25 MG CR tablet 90 tablet 0    Sig: Take 1 tablet (6.25 mg total) by mouth at bedtime as needed. for sleep     Not Delegated - Psychiatry:  Anxiolytics/Hypnotics Failed - 02/17/2023  1:20 PM      Failed - This refill cannot be delegated      Failed - Urine Drug Screen completed in last 360 days      Passed - Valid encounter within last 6 months    Recent Outpatient Visits           1 month ago Simple chronic bronchitis [facility])   [facility] Steele Sizer, MD   4 months ago Simple chronic bronchitis [facility])   [facility] Steele Sizer, MD   5 months ago Swelling of lower extremity   [facility] Teodora Medici, DO   7 months ago History of alcoholism [facility])   [facility] Steele Sizer, MD   10 months ago COPD with acute exacerbation [facility])   [facility] Steele Sizer, MD       Future Appointments             In 3 months Ancil Boozer, Drue Stager, MD [facility], [place]   In 4 months Steele Sizer, MD [facility], [facility]

## 2023-02-17 NOTE — Telephone Encounter (Signed)
Requested medication (s) are due for refill today:   Requested medication (s) are on the active medication list: Yes  Last refill:  Zanaflex  19/31/23.  Future visit scheduled: Yes  Notes to clinic:  See request.    Requested Prescriptions  Pending Prescriptions Disp Refills   atorvastatin (LIPITOR) 40 MG tablet 90 tablet 3    Sig: Take 1 tablet (40 mg total) by mouth daily.     Cardiovascular:  Antilipid - Statins Failed - 02/17/2023  1:23 PM      Failed - Lipid Panel in normal range within the last 12 months    Cholesterol, Total  Date Value Ref Range Status  07/25/2022 175 100 - 199 mg/dL Final   LDL Cholesterol (Calc)  Date Value Ref Range Status  10/30/2019 89 mg/dL (calc) Final    Comment:    Reference range: <100 . Desirable range <100 mg/dL for primary prevention;   <70 mg/dL for patients with CHD or diabetic patients  with > or = 2 CHD risk factors. Marland Kitchen LDL-C is now calculated using the Martin-Hopkins  calculation, which is a validated novel method providing  better accuracy than the Friedewald equation in the  estimation of LDL-C.  Cresenciano Genre et al. Annamaria Helling. MU:[id number]): 2061-2068  (http://education.[employer].com/faq/FAQ164)    LDL Chol Calc (NIH)  Date Value Ref Range Status  07/25/2022 97 0 - 99 mg/dL Final   HDL  Date Value Ref Range Status  07/25/2022 36 (L) >39 mg/dL Final   Triglycerides  Date Value Ref Range Status  07/25/2022 244 (H) 0 - 149 mg/dL Final         Passed - Patient is not pregnant      Passed - Valid encounter within last 12 months    Recent Outpatient Visits           1 month ago Simple chronic bronchitis [facility])   [facility] Steele Sizer, MD   4 months ago Simple chronic bronchitis [facility])   [facility] Steele Sizer, MD   5 months ago Swelling of lower extremity   [facility] Teodora Medici, DO   7 months ago History of alcoholism  [facility])   [facility] Steele Sizer, MD   10 months ago COPD with acute exacerbation [facility])   [facility] Steele Sizer, MD       Future Appointments             In 3 months Steele Sizer, MD [facility], [place]   In 4 months Steele Sizer, MD [facility], [facility]             tiZANidine (ZANAFLEX) 4 MG tablet 270 tablet 1    Sig: Take 1 tablet (4 mg total) by mouth 3 (three) times daily.     Not Delegated - Cardiovascular:  Alpha-2 Agonists - tizanidine Failed - 02/17/2023  1:23 PM      Failed - This refill cannot be delegated      Passed - Valid encounter within last 6 months    Recent Outpatient Visits           1 month ago Simple chronic bronchitis [facility])   [facility] Steele Sizer, MD   4 months ago Simple chronic bronchitis [facility])   [facility] [place], Drue Stager, MD   5 months ago Swelling of lower extremity  [facility] Teodora Medici, DO   7 months ago History of alcoholism [facility])   [facility] Steele Sizer, MD   10 months ago COPD with acute exacerbation [facility])   [facility] Steele Sizer, MD       Future Appointments             In 3 months Steele Sizer, MD [facility], [place]   In 4 months Steele Sizer, MD [facility], [facility]             amLODipine-benazepril (LOTREL) 5-20 MG capsule 90 capsule 1    Sig: Take 1 capsule by mouth daily.     Cardiovascular: CCB + ACEI Combos Failed - 02/17/2023  1:23 PM      Failed - Cr in normal range and within 180 days    Creat  Date Value Ref Range Status  10/30/2019 0.65 (L) 0.70 - 1.25 mg/dL Final    Comment:    For patients >64 years of age, the reference limit for Creatinine is approximately 13% higher for  people identified as African-American. .    Creatinine, Ser  Date Value Ref Range Status  08/23/2022 0.67 (L) 0.76 - 1.27 mg/dL Final         Passed - K in normal range and within 180 days    Potassium  Date Value Ref Range Status  08/23/2022 4.2 3.5 - 5.2 mmol/L Final         Passed - Na in normal range and within 180 days    Sodium  Date Value Ref Range Status  08/23/2022 140 134 - 144 mmol/L Final         Passed - eGFR is 30 or above and within 180 days    GFR, Est African American  Date Value Ref Range Status  10/30/2019 122 > OR = 60 mL/min/1.9m Final   GFR calc Af Amer  Date Value Ref Range Status  10/06/2020 108 >59 mL/min/1.73 Final    Comment:    **In accordance with recommendations from the NKF-ASN Task force,**   [facility] is in the process of updating its eGFR calculation to the   2021 CKD-EPI creatinine equation that estimates kidney function   without a race variable.    GFR, Est Non African American  Date Value Ref Range Status  10/30/2019 105 > OR = 60 mL/min/1.721mFinal   GFR calc non Af Amer  Date Value Ref Range Status  10/06/2020 93 >59 mL/min/1.73 Final   eGFR  Date Value Ref Range Status  08/23/2022 104 >59 mL/min/1.73 Final         Passed - Patient is not pregnant      Passed - Last BP in normal range    BP Readings from Last 1 Encounters:  01/11/23 126/74         Passed - Valid encounter within last 6 months    Recent Outpatient Visits           1 month ago [facility]   4 months ago [facility]   5 months ago Swelling of lower extremity   [facility]   7 months ago [facility]  10 months ago COPD with acute exacerbation [facility])   [facility] Steele Sizer, MD       Future Appointments             In 3 months Ancil Boozer, Drue Stager, MD [facility], [place]   In 4 months Steele Sizer, MD [facility], [facility]

## 2023-02-17 NOTE — Telephone Encounter (Signed)
Medication Refill - Medication: atorvastatin (LIPITOR) 40 MG tablet [id number]  amLODipine-benazepril (LOTREL) 5-20 MG capsule [id number]    tiZANidine (ZANAFLEX) 4 MG tablet [id number]      Has the patient contacted their pharmacy? Yes.   (Agent: If no, request that the patie nt contact the pharmacy for the refill. If patient does not wish to contact the pharmacy document the reason why and proceed with request.) (Agent: If yes, when and what did the pharmacy advise?)  Preferred Pharmacy (with phone number or street name):  [facility], [place] [address] [place], [place] [address] Phone: [phone]  Fax: [phone]   Has the patient been seen for an appointment in the last year OR does the patient have an upcoming appointment? Yes.    Agent: Please be advised that RX refills may take up to 3 business days. We ask that you follow-up with your pharmacy.

## 2023-02-20 MED ORDER — TIZANIDINE HCL 4 MG PO TABS: 4.0000 mg | ORAL_TABLET | Freq: Three times a day (TID) | ORAL | 0 refills | Status: DC

## 2023-02-21 NOTE — Telephone Encounter (Signed)
Patient called stated he contacted [facility] for his zolpidem (AMBIEN CR) 6.25 MG CR tablet but they were out of stock and need the script sent to: [place], [place] Phone: [phone]  Fax: [phone]   Please f/u with patient per he states he never received the medication from the 01/11/23 request.

## 2023-02-24 ENCOUNTER — Other Ambulatory Visit: Payer: Self-pay

## 2023-02-24 ENCOUNTER — Other Ambulatory Visit: Payer: Self-pay | Admitting: Family Medicine

## 2023-02-24 DIAGNOSIS — G4700 Insomnia, unspecified: Secondary | ICD-10-CM

## 2023-02-24 MED ORDER — ZOLPIDEM TARTRATE ER 6.25 MG PO TBCR: 6.2500 mg | EXTENDED_RELEASE_TABLET | Freq: Every evening | ORAL | 0 refills | Status: DC | PRN

## 2023-02-24 NOTE — Telephone Encounter (Signed)
Pt is calling back and requesting an update on his request to have the medication zolpidem (AMBIEN CR) 6.25 MG CR tablet refilled. states he never received the medication from the 01/11/23 request. [facility] was out of stock of the medication.  Please advise.

## 2023-04-07 ENCOUNTER — Encounter: Payer: Managed Care, Other (non HMO) | Admitting: Family Medicine

## 2023-05-25 ENCOUNTER — Ambulatory Visit: Payer: Managed Care, Other (non HMO) | Admitting: Family Medicine

## 2023-07-03 NOTE — Progress Notes (Unsigned)
Name: Carl Burke   MRN: [id number]    DOB: [birth year]   Date:07/04/2023       Progress Note  Subjective  Chief Complaint  Annual Exam  HPI  Patient presents for annual CPE.  IPSS Questionnaire (AUA-7): Over the past month.   1)  How often have you had a sensation of not emptying your bladder completely after you finish urinating?  1 - Less than 1 time in 5  2)  How often have you had to urinate again less than two hours after you finished urinating? 1 - Less than 1 time in 5  3)  How often have you found you stopped and started again several times when you urinated?  1 - Less than 1 time in 5  4) How difficult have you found it to postpone urination?  1 - Less than 1 time in 5  5) How often have you had a weak urinary stream?  2 - Less than half the time  6) How often have you had to push or strain to begin urination?  0 - Not at all  7) How many times did you most typically get up to urinate from the time you went to bed until the time you got up in the morning?  4 - 4 times  Total score:  0-7 mildly symptomatic   8-19 moderately symptomatic   20-35 severely symptomatic     Diet: cooks at home, eats a lot of vegetables, he is going to increase protein intake - he has been drinking a protein shake in am Exercise: discussed importance of regular activity  Last Dental Exam: up to date  Last Eye Exam: up to date   Depression: phq 9 is positive    07/04/2023    8:17 AM 01/11/2023    8:17 AM 10/11/2022    8:27 AM 08/23/2022    1:57 PM 06/30/2022    8:34 AM  Depression screen PHQ 2/9  Decreased Interest 0 1 0 0 2  Down, Depressed, Hopeless 0 1 0 0 0  PHQ - 2 Score 0 2 0 0 2  Altered sleeping 0 0 0 0 3  Tired, decreased energy 0 0 0 0 3  Change in appetite 0 0 0 0 0  Feeling bad or failure about yourself  0 0 0 0 1  Trouble concentrating 0 0 0 0 0  Moving slowly or fidgety/restless 0 0 0 0 0  Suicidal thoughts 0 0 0 0 0  PHQ-9 Score 0 2 0 0 9  Difficult doing work/chores     Not difficult at all     Hypertension:  BP Readings from Last 3 Encounters:  07/04/23 128/72  01/11/23 126/74  10/11/22 130/70    Obesity: Wt Readings from Last 3 Encounters:  07/04/23 279 lb (126.6 kg)  01/11/23 285 lb (129.3 kg)  10/11/22 286 lb 1.6 oz (129.8 kg)   BMI Readings from Last 3 Encounters:  07/04/23 37.84 kg/m  01/11/23 38.65 kg/m  10/11/22 38.53 kg/m     Lipids:  Lab Results  Component Value Date   CHOL 175 07/25/2022   CHOL 156 05/07/2021   CHOL 203 (H) 10/06/2020   Lab Results  Component Value Date   HDL 36 (L) 07/25/2022   HDL 35 (L) 05/07/2021   HDL 40 10/06/2020   Lab Results  Component Value Date   LDLCALC 97 07/25/2022   LDLCALC 87 05/07/2021   LDLCALC 110 (H) 10/06/2020   Lab Results  Component Value Date   TRIG 244 (H) 07/25/2022   TRIG 200 (H) 05/07/2021   TRIG 309 (H) 10/06/2020   Lab Results  Component Value Date   CHOLHDL 4.9 07/25/2022   CHOLHDL 4.5 05/07/2021   CHOLHDL 5.1 (H) 10/06/2020   No results found for: "LDLDIRECT" Glucose:  Glucose  Date Value Ref Range Status  08/23/2022 105 (H) 70 - 99 mg/dL Final  84/13/2440 102 (H) 70 - 99 mg/dL Final  72/53/6644 034 (H) 65 - 99 mg/dL Final   Glucose, Bld  Date Value Ref Range Status  10/30/2019 117 (H) 65 - 99 mg/dL Final    Comment:    .            Fasting reference interval . For someone without known diabetes, a glucose value between 100 and 125 mg/dL is consistent with prediabetes and should be confirmed with a follow-up test. .   11/09/2016 136 (H) 65 - 99 mg/dL Final  74/25/9563 875 (H) 65 - 99 mg/dL Final    Flowsheet Row Office Visit from 07/04/2023 in [facility]  AUDIT-C Score 1       Married STD testing and prevention (HIV/chl/gon/syphilis): 07/18/18 Sexual history: one partner, no problems  Hep C Screening: 01/03/19 Skin cancer: Discussed monitoring for atypical lesions Colorectal cancer: 08/23/18 Prostate cancer:  discussed USPTF , positive AUA questionnaire    Lung cancer:  Low Dose CT Chest recommended if Age 65-80 years, 30 pack-year currently smoking OR have quit w/in 15years. Patient  is a candidate  AAA: The USPSTF recommends one-time screening with ultrasonography in men ages 65 to 92 years who have ever smoked. Patient  is a candidate and we will scheduled it during welcome to medicare ECG:  07/17/16  Vaccines:   RSV: refused  Tdap: up to date Shingrix: refused  Pneumonia: refused Flu: refused  COVID-19: up to date  Advanced Care Planning: A voluntary discussion about advance care planning including the explanation and discussion of advance directives.  Discussed health care proxy and Living will, and the patient was able to identify a health care proxy as wife .  Patient thinks he has  a living will and power of attorney of health care   Patient Active Problem List   Diagnosis Date Noted   Simple chronic bronchitis (HCC) 01/11/2023   Morbid obesity (HCC) 01/11/2023   Senile purpura (HCC) 04/04/2022   History of alcoholism (HCC) 04/04/2022   Seasonal affective disorder (HCC) 01/19/2021   Dyslipidemia 10/31/2019   Umbilical hernia without obstruction and without gangrene 07/17/2019   Pre-diabetes 01/07/2019   Degeneration of lumbosacral intervertebral disc 03/28/2018   Full thickness rotator cuff tear 03/28/2018   Neck pain 03/28/2018   Hyperlipidemia 12/20/2016   Leukocytosis 07/17/2016   Chronic shoulder pain (Right) 04/12/2016   Cervical spondylosis 04/12/2016   Chronic upper back pain (Location of Secondary source of pain) (Bilateral) (midline) 04/12/2016   Essential hypertension 04/12/2016   Lumbosacral radiculopathy at S1 (Left) 04/12/2016   Rotator cuff arthropathy of right shoulder 08/15/2013    Past Surgical History:  Procedure Laterality Date   COLONOSCOPY     COLONOSCOPY WITH PROPOFOL N/A 08/23/2018   Procedure: COLONOSCOPY WITH PROPOFOL;  Surgeon: Toney Reil, MD;  Location: [facility] ENDOSCOPY;  Service: Gastroenterology;  Laterality: N/A;   ESOPHAGOGASTRODUODENOSCOPY N/A 07/19/2016   Procedure: ESOPHAGOGASTRODUODENOSCOPY (EGD);  Surgeon: Iva Boop, MD;  Location: [facility] ENDOSCOPY;  Service: Endoscopy;  Laterality: N/A;   ESOPHAGOGASTRODUODENOSCOPY (EGD) WITH PROPOFOL  N/A 08/23/2018   Procedure: ESOPHAGOGASTRODUODENOSCOPY (EGD) WITH PROPOFOL;  Surgeon: Toney Reil, MD;  Location[facility] ENDOSCOPY;  Service: Gastroenterology;  Laterality: N/A;   ROTATOR CUFF REPAIR Left 2011   ROTATOR CUFF REPAIR Right 2013    Family History  Problem Relation Age of Onset   Heart disease Mother    Colon cancer Father    Cancer Sister    Breast cancer Sister 21   Cancer Sister     Social History   Socioeconomic History   Marital status: Married    Spouse name: Alona Bene   Number of children: 3   Years of education: Not on file   Highest education level: Not on file  Occupational History   Occupation: retired   Tobacco Use   Smoking status: Former    Current packs/day: 0.00    Average packs/day: 0.5 packs/day for 51.4 years (25.7 ttl pk-yrs)    Types: Cigarettes    Start date: 09/30/1970    Quit date: 03/12/2022    Years since quitting: 1.3   Smokeless tobacco: Never  Vaping Use   Vaping status: Never Used  Substance and Sexual Activity   Alcohol use: Yes    Alcohol/week: 0.0 standard drinks of alcohol    Comment: Rare/occassional    Drug use: No   Sexual activity: Yes    Partners: Female  Other Topics Concern   Not on file  Social History Narrative   He retired, 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 but does not drink, just to taste it   He used to be very active, boxer   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  (07/04/2023)   Overall Financial Resource Strain (CARDIA)    Difficulty of Paying Living Expenses: Not hard at all  Food Insecurity: No Food Insecurity (07/04/2023)   Hunger Vital Sign    Worried  About Running Out of Food in the Last Year: Never true    Ran Out of Food in the Last Year: Never true  Transportation Needs: No Transportation Needs (07/04/2023)   PRAPARE - Administrator, Civil Service (Medical): No    Lack of Transportation (Non-Medical): No  Physical Activity: Inactive (07/04/2023)   Exercise Vital Sign    Days of Exercise per Week: 0 days    Minutes of Exercise per Session: 0 min  Stress: Stress Concern Present (07/04/2023)   [employer] of Occupational Health - Occupational Stress Questionnaire    Feeling of Stress : To some extent  Social Connections: Moderately Isolated (07/04/2023)   Social Connection and Isolation Panel [NHANES]    Frequency of Communication with Friends and Family: More than three times a week    Frequency of Social Gatherings with Friends and Family: Never    Attends Religious Services: Never    Database administrator or Organizations: No    Attends Banker Meetings: Never    Marital Status: Married  Catering manager Violence: Not At Risk (07/04/2023)   Humiliation, Afraid, Rape, and Kick questionnaire    Fear of Current or Ex-Partner: No    Emotionally Abused: No    Physically Abused: No    Sexually Abused: No     Current Outpatient Medications:    amLODipine-benazepril (LOTREL) 5-20 MG capsule, Take 1 capsule by mouth daily., Disp: 90 capsule, Rfl: 1   atorvastatin (LIPITOR) 40 MG tablet, Take 1 tablet (40 mg total) by mouth daily., Disp: 90 tablet, Rfl: 3  azelastine (ASTELIN) 0.1 % nasal spray, Place 1 spray into both nostrils 2 (two) times daily. Use in each nostril as directed, Disp: 90 mL, Rfl: 1   Fluticasone-Umeclidin-Vilant (TRELEGY ELLIPTA) 100-62.5-25 MCG/ACT AEPB, Inhale 1 puff into the lungs daily., Disp: 1 each, Rfl: 5   Ibuprofen 200 MG CAPS, Take by mouth., Disp: , Rfl:    loratadine (CLARITIN) 10 MG tablet, Take 1 tablet (10 mg total) by mouth 2 (two) times daily., Disp: 180 tablet, Rfl: 1    tiZANidine (ZANAFLEX) 4 MG tablet, Take 1 tablet (4 mg total) by mouth 3 (three) times daily., Disp: 270 tablet, Rfl: 0   Vitamin D, Ergocalciferol, (DRISDOL) 1.25 MG (50000 UNIT) CAPS capsule, TAKE 1 CAPSULE BY MOUTH EVERY 7  DAYS, Disp: 12 capsule, Rfl: 3   zolpidem (AMBIEN CR) 6.25 MG CR tablet, Take 1 tablet (6.25 mg total) by mouth at bedtime as needed. for sleep, Disp: 90 tablet, Rfl: 0  Allergies  Allergen Reactions   Naproxen Sodium Rash and Other (See Comments)    800 mg tablets that the MD prescribed for pain.     ROS  Constitutional: Negative for fever or weight change.  Respiratory: Negative for cough , he has shortness of breath with activity .   Cardiovascular: Negative for chest pain or palpitations.  Gastrointestinal: Negative for abdominal pain, no bowel changes.  Musculoskeletal: Negative for gait problem, positive for intermittent  joint swelling.  Skin: Negative for rash.  Neurological: Negative for dizziness or headache.  No other specific complaints in a complete review of systems (except as listed in HPI above).    Objective  Vitals:   07/04/23 0854  BP: 128/72  Pulse: 70  Resp: 18  Temp: 97.8 F (36.6 C)  TempSrc: Oral  SpO2: 94%  Weight: 279 lb (126.6 kg)  Height: 6' (1.829 m)    Body mass index is 37.84 kg/m.  Physical Exam  Constitutional: Patient appears well-developed and well-nourished. No distress.  HENT: Head: Normocephalic and atraumatic. Ears: B TMs ok, no erythema or effusion; Nose: Nose normal. Mouth/Throat: Oropharynx is clear and moist. No oropharyngeal exudate.  Eyes: Conjunctivae and EOM are normal. Pupils are equal, round, and reactive to light. No scleral icterus.  Neck: Normal range of motion. Neck supple. No JVD present. No thyromegaly present.  Cardiovascular: Normal rate, regular rhythm and normal heart sounds.  No murmur heard. No BLE edema. Pulmonary/Chest: Effort normal and breath sounds normal. No respiratory  distress. Abdominal: Soft. Bowel sounds are normal, no distension. There is no tenderness. no masses MALE GENITALIA: Normal descended testes bilaterally, no masses palpated, no hernias, no lesions, no discharge RECTAL: not done  Musculoskeletal: Normal range of motion, no joint effusions. No gross deformities Neurological: he is alert and oriented to person, place, and time. No cranial nerve deficit. Coordination, balance, strength, speech and gait are normal.  Skin: Skin is warm and dry. No rash noted. No erythema.  Psychiatric: Patient has a normal mood and affect. behavior is normal. Judgment and thought content normal.   Fall Risk:    07/04/2023    8:16 AM 01/11/2023    8:17 AM 10/11/2022    8:27 AM 08/23/2022    1:57 PM 06/30/2022    8:34 AM  Fall Risk   Falls in the past year? 0 0 1 1 0  Number falls in past yr:  0 1 0 0  Injury with Fall?  0 0 1 0  Risk for fall due to : No  Fall Risks No Fall Risks History of fall(s)  No Fall Risks  Follow up Falls prevention discussed;Education provided;Falls evaluation completed Falls prevention discussed Falls prevention discussed;Education provided;Falls evaluation completed  Falls prevention discussed     Functional Status Survey: Is the patient deaf or have difficulty hearing?: Yes Does the patient have difficulty seeing, even when wearing glasses/contacts?: No Does the patient have difficulty concentrating, remembering, or making decisions?: Yes Does the patient have difficulty walking or climbing stairs?: Yes Does the patient have difficulty dressing or bathing?: No Does the patient have difficulty doing errands alone such as visiting a doctor's office or shopping?: No    Assessment & Plan  1. Well adult exam   2. Vitamin D deficiency  - VITAMIN D 25 Hydroxy (Vit-D Deficiency, Fractures)  3. B12 deficiency  - B12 and Folate Panel  4. History of alcoholism (HCC)  - Vitamin B1  5. Swelling of lower extremity  Checking  labs  6. Hyperglycemia  - Hemoglobin A1c  7. Long-term use of high-risk medication  - CBC with Differential/Platelet - COMPLETE METABOLIC PANEL WITH GFR  8. SOB (shortness of breath) on exertion  - CBC with Differential/Platelet - B Nat Peptide  9. Mixed hyperlipidemia  - Lipid panel  10. Need for vaccination with 20-polyvalent pneumococcal conjugate vaccine  Refused   11. Colon cancer screening  - Ambulatory referral to Gastroenterology  12. Screening for lung cancer  - Ambulatory Referral Lung Cancer Screening [facility]    -Prostate cancer screening and PSA options (with potential risks and benefits of testing vs not testing) were discussed along with recent recs/guidelines. -USPSTF grade A and B recommendations reviewed with patient; age-appropriate recommendations, preventive care, screening tests, etc discussed and encouraged; healthy living encouraged; see AVS for patient education given to patient -Discussed importance of 150 minutes of physical activity weekly, eat two servings of fish weekly, eat one serving of tree nuts ( cashews, pistachios, pecans, almonds.Marland Kitchen) every other day, eat 6 servings of fruit/vegetables daily and drink plenty of water and avoid sweet beverages.  -Reviewed Health Maintenance: yes

## 2023-07-04 ENCOUNTER — Encounter: Payer: Self-pay | Admitting: Family Medicine

## 2023-07-04 ENCOUNTER — Telehealth: Payer: Self-pay

## 2023-07-04 ENCOUNTER — Ambulatory Visit (INDEPENDENT_AMBULATORY_CARE_PROVIDER_SITE_OTHER): Payer: Medicare HMO | Admitting: Family Medicine

## 2023-07-04 ENCOUNTER — Other Ambulatory Visit: Payer: Self-pay

## 2023-07-04 VITALS — BP 128/72 | HR 70 | Temp 97.8°F | Resp 18 | Ht 72.0 in | Wt 279.0 lb

## 2023-07-04 DIAGNOSIS — Z860100 Personal history of colon polyps, unspecified: Secondary | ICD-10-CM

## 2023-07-04 DIAGNOSIS — E538 Deficiency of other specified B group vitamins: Secondary | ICD-10-CM

## 2023-07-04 DIAGNOSIS — E559 Vitamin D deficiency, unspecified: Secondary | ICD-10-CM

## 2023-07-04 DIAGNOSIS — Z800 Family history of malignant neoplasm of digestive organs: Secondary | ICD-10-CM

## 2023-07-04 DIAGNOSIS — E782 Mixed hyperlipidemia: Secondary | ICD-10-CM | POA: Diagnosis not present

## 2023-07-04 DIAGNOSIS — Z23 Encounter for immunization: Secondary | ICD-10-CM

## 2023-07-04 DIAGNOSIS — R0602 Shortness of breath: Secondary | ICD-10-CM

## 2023-07-04 DIAGNOSIS — F1021 Alcohol dependence, in remission: Secondary | ICD-10-CM

## 2023-07-04 DIAGNOSIS — Z0000 Encounter for general adult medical examination without abnormal findings: Secondary | ICD-10-CM

## 2023-07-04 DIAGNOSIS — Z1211 Encounter for screening for malignant neoplasm of colon: Secondary | ICD-10-CM

## 2023-07-04 DIAGNOSIS — Z122 Encounter for screening for malignant neoplasm of respiratory organs: Secondary | ICD-10-CM

## 2023-07-04 DIAGNOSIS — R399 Unspecified symptoms and signs involving the genitourinary system: Secondary | ICD-10-CM | POA: Diagnosis not present

## 2023-07-04 DIAGNOSIS — Z79899 Other long term (current) drug therapy: Secondary | ICD-10-CM

## 2023-07-04 DIAGNOSIS — R739 Hyperglycemia, unspecified: Secondary | ICD-10-CM | POA: Diagnosis not present

## 2023-07-04 DIAGNOSIS — N401 Enlarged prostate with lower urinary tract symptoms: Secondary | ICD-10-CM | POA: Diagnosis not present

## 2023-07-04 LAB — CBC WITH DIFFERENTIAL/PLATELET
Absolute Monocytes: 779 cells/uL (ref 200–950)
Basophils Absolute: 67 cells/uL (ref 0–200)
HCT: 50.6 % — ABNORMAL HIGH (ref 38.5–50.0)
Hemoglobin: 17.2 g/dL — ABNORMAL HIGH (ref 13.2–17.1)
Lymphs Abs: 3230 cells/uL (ref 850–3900)
MCH: 30.5 pg (ref 27.0–33.0)
Neutrophils Relative %: 55.2 %
RBC: 5.64 10*6/uL (ref 4.20–5.80)
Total Lymphocyte: 34 %
WBC: 9.5 10*3/uL (ref 3.8–10.8)

## 2023-07-04 MED ORDER — NA SULFATE-K SULFATE-MG SULF 17.5-3.13-1.6 GM/177ML PO SOLN: 1.0000 | Freq: Once | ORAL | 0 refills | Status: AC

## 2023-07-04 NOTE — Telephone Encounter (Signed)
Gastroenterology Pre-Procedure Review  Request Date: 08/24/23 Requesting Physician: Dr. Allegra Lai  PATIENT REVIEW QUESTIONS: The patient responded to the following health history questions as indicated:    1. Are you having any GI issues? no 2. Do you have a personal history of Polyps? Yes last colonoscopy performed by Dr. Allegra Lai 08/23/2018 3. Do you have a family history of Colon Cancer or Polyps? yes (father colon cancer) 4. Diabetes Mellitus? no 5. Joint replacements in the past 12 months?no 6. Major health problems in the past 3 months?no 7. Any artificial heart valves, MVP, or defibrillator?no    MEDICATIONS & ALLERGIES:    Patient reports the following regarding taking any anticoagulation/antiplatelet therapy:   Plavix, Coumadin, Eliquis, Xarelto, Lovenox, Pradaxa, Brilinta, or Effient? no Aspirin? no  Patient confirms/reports the following medications:  Current Outpatient Medications  Medication Sig Dispense Refill   amLODipine-benazepril (LOTREL) 5-20 MG capsule Take 1 capsule by mouth daily. 90 capsule 1   atorvastatin (LIPITOR) 40 MG tablet Take 1 tablet (40 mg total) by mouth daily. 90 tablet 3   azelastine (ASTELIN) 0.1 % nasal spray Place 1 spray into both nostrils 2 (two) times daily. Use in each nostril as directed 90 mL 1   Fluticasone-Umeclidin-Vilant (TRELEGY ELLIPTA) 100-62.5-25 MCG/ACT AEPB Inhale 1 puff into the lungs daily. 1 each 5   Ibuprofen 200 MG CAPS Take by mouth.     loratadine (CLARITIN) 10 MG tablet Take 1 tablet (10 mg total) by mouth 2 (two) times daily. 180 tablet 1   tiZANidine (ZANAFLEX) 4 MG tablet Take 1 tablet (4 mg total) by mouth 3 (three) times daily. 270 tablet 0   Vitamin D, Ergocalciferol, (DRISDOL) 1.25 MG (50000 UNIT) CAPS capsule TAKE 1 CAPSULE BY MOUTH EVERY 7  DAYS 12 capsule 3   zolpidem (AMBIEN CR) 6.25 MG CR tablet Take 1 tablet (6.25 mg total) by mouth at bedtime as needed. for sleep 90 tablet 0   No current facility-administered  medications for this visit.    Patient confirms/reports the following allergies:  Allergies  Allergen Reactions   Naproxen Sodium Rash and Other (See Comments)    800 mg tablets that the MD prescribed for pain.    No orders of the defined types were placed in this encounter.   AUTHORIZATION INFORMATION Primary Insurance: 1D#: Group #:  Secondary Insurance: 1D#: Group #:  SCHEDULE INFORMATION: Date: 08/24/23 Time: Location: [facility]

## 2023-07-05 LAB — HEMOGLOBIN A1C
Hgb A1c MFr Bld: 6.5 % of total Hgb — ABNORMAL HIGH (ref <5.7)
Mean Plasma Glucose: 140 mg/dL
eAG (mmol/L): 7.7 mmol/L

## 2023-07-05 LAB — COMPLETE METABOLIC PANEL WITH GFR
AG Ratio: 1.8 (calc) (ref 1.0–2.5)
ALT: 31 U/L (ref 9–46)
AST: 22 U/L (ref 10–35)
Albumin: 4.4 g/dL (ref 3.6–5.1)
Alkaline phosphatase (APISO): 69 U/L (ref 35–144)
BUN: 14 mg/dL (ref 7–25)
CO2: 27 mmol/L (ref 20–32)
Calcium: 9.4 mg/dL (ref 8.6–10.3)
Chloride: 104 mmol/L (ref 98–110)
Creat: 0.77 mg/dL (ref 0.70–1.35)
Globulin: 2.4 g/dL (calc) (ref 1.9–3.7)
Glucose, Bld: 113 mg/dL — ABNORMAL HIGH (ref 65–99)
Potassium: 4.5 mmol/L (ref 3.5–5.3)
Sodium: 140 mmol/L (ref 135–146)
Total Bilirubin: 0.4 mg/dL (ref 0.2–1.2)
Total Protein: 6.8 g/dL (ref 6.1–8.1)
eGFR: 99 mL/min/{1.73_m2} (ref >=60)

## 2023-07-05 LAB — B12 AND FOLATE PANEL
Folate: 16.7 ng/mL
Vitamin B-12: 296 pg/mL (ref 200–1100)

## 2023-07-05 LAB — CBC WITH DIFFERENTIAL/PLATELET
Basophils Relative: 0.7 %
Eosinophils Absolute: 181 cells/uL (ref 15–500)
Eosinophils Relative: 1.9 %
MCHC: 34 g/dL (ref 32.0–36.0)
MCV: 89.7 fL (ref 80.0–100.0)
MPV: 11.3 fL (ref 7.5–12.5)
Monocytes Relative: 8.2 %
Neutro Abs: 5244 cells/uL (ref 1500–7800)
Platelets: 255 10*3/uL (ref 140–400)
RDW: 13.9 % (ref 11.0–15.0)

## 2023-07-05 LAB — LIPID PANEL
Cholesterol: 154 mg/dL (ref <200)
HDL: 41 mg/dL (ref >=40)
LDL Cholesterol (Calc): 91 mg/dL (calc)
Non-HDL Cholesterol (Calc): 113 mg/dL (calc) (ref <130)
Total CHOL/HDL Ratio: 3.8 (calc) (ref <5.0)
Triglycerides: 123 mg/dL (ref <150)

## 2023-07-05 LAB — BRAIN NATRIURETIC PEPTIDE: Brain Natriuretic Peptide: 6 pg/mL (ref <100)

## 2023-07-05 LAB — PSA: PSA: 1.06 ng/mL (ref <=4.00)

## 2023-07-05 LAB — VITAMIN D 25 HYDROXY (VIT D DEFICIENCY, FRACTURES): Vit D, 25-Hydroxy: 35 ng/mL (ref 30–100)

## 2023-07-10 ENCOUNTER — Telehealth: Payer: Self-pay

## 2023-07-10 NOTE — Telephone Encounter (Signed)
Received notification from [facility] requesting alternative prep to be sent to pharmacy.  Suprep is not covered.  Nulytely prep has been sent to pharmacy.  Updated instructions to reflect rx change from Suprep to [facility].   Contacted patient to let him know prep has been changed.  His wife said prep has already been picked up.  Deleted updated instructions and did not send Nulytely.  Thanks,  [place], [place]

## 2023-07-11 ENCOUNTER — Ambulatory Visit (INDEPENDENT_AMBULATORY_CARE_PROVIDER_SITE_OTHER): Payer: Medicare HMO

## 2023-07-11 DIAGNOSIS — E538 Deficiency of other specified B group vitamins: Secondary | ICD-10-CM

## 2023-07-11 MED ORDER — CYANOCOBALAMIN 1000 MCG/ML IJ SOLN
1000.0000 ug | Freq: Once | INTRAMUSCULAR | Status: AC
Start: 2023-07-11 — End: 2023-07-11
  Administered 2023-07-11: 1000 ug via INTRAMUSCULAR

## 2023-07-17 NOTE — Progress Notes (Unsigned)
Name: Carl Burke   MRN: [id number]    DOB: [birth year]   Date:07/18/2023       Progress Note  Subjective  Chief Complaint  2 week follow up (CPE)  HPI  Chronic bronchitis: he has been smoking since age 67, he states initially just a few cigarettes, in his 24's and 55 's he smoked up to 3 packs per day. 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 Stable   HTN: he denies chest pain, dizziness  or palpitation, he has good exercise tolerance, taking medication daily. BP is well controlled     DMII: associated dyslipidemia and HTN, A1C of 6.5 % twice, diet controlled and takes bp and lipid medication. Discussed importance of yearly eye exam and foot exam. He denies polyphagia, polydipsia or polyuria    Chronic back pain, neck pain, knee pain radiculitis/also hands: used to take narcotics , seen by pain clinic, but he states medications did not help so he stopped everything , only on Zanaflex three times a day. Pain right now is higher due to replacing toilets at his house    Dyslipidemia: taking Atorvastatin  but off  Vascepa because of cost, last LDL was 91, triglycerides back to normal    Morbid obesity : BMI above 35 with co-morbidies , such as HTN, chronic back pain, dyslipidemia.  He is trying to lose weight    Umbilical hernia: hernia has been present for many years, he saw surgeon, he is wearing a compression vest .  Still not ready for surgery . Unchanged    Insomnia: chronic, takes Ambien every other night and it helps him fall and stay asleep. He gets 90 days but lasts about 5-6 months. He states it helps with his sleep about half the time, but does not want to change . He states pain is higher and  has to take it more often lately   History of alcoholism: he states used to drink a bottle of hard liquor per night in his 30's and 7 's he had 4 DUI's in his life time, but states now only drinks occasionally and no more than two shots of hard liquor and sometimes a beer. He states not making moonshine lately so has been tasting it as often  Perennial AR: he is taking loratadine and nasal spray again, he goes from congestion to rhinorrhea . Stable   Dysthymia/Seasonal affective disorder:phq 9 is normal today. He does not want medications, he has worsening of symptoms during winter months. He is feeling well at this time   Senile purpura: on arms, reassurance given. Unchanged   Left gynecomastia: seen by surgeon, had mammogram, he states doing well over the past couple of months, no pain and very small swelling now   Patient Active Problem List   Diagnosis Date Noted   Diabetes mellitus type 2, diet-controlled (HCC) 07/18/2023   B12 deficiency 07/18/2023   Insomnia 07/18/2023   Vitamin D deficiency 07/18/2023   Chronic pain syndrome 07/18/2023   Simple chronic bronchitis (HCC) 01/11/2023   Morbid obesity (HCC) 01/11/2023   Senile purpura (HCC) 04/04/2022   History of alcoholism (HCC) 04/04/2022  Seasonal affective disorder (HCC) 01/19/2021   Dyslipidemia 10/31/2019   Umbilical hernia without obstruction and without gangrene 07/17/2019   Pre-diabetes 01/07/2019   Degeneration of lumbosacral intervertebral disc 03/28/2018   Full thickness rotator cuff tear 03/28/2018   Neck pain 03/28/2018   Hyperlipidemia 12/20/2016   Leukocytosis 07/17/2016   Chronic shoulder pain (Right) 04/12/2016   Cervical spondylosis 04/12/2016   Chronic upper back pain (Location of Secondary source of pain) (Bilateral) (midline) 04/12/2016   Essential hypertension 04/12/2016   Lumbosacral radiculopathy at S1 (Left) 04/12/2016   Rotator cuff arthropathy of right shoulder 08/15/2013    Past Surgical  History:  Procedure Laterality Date   COLONOSCOPY     COLONOSCOPY WITH PROPOFOL N/A 08/23/2018   Procedure: COLONOSCOPY WITH PROPOFOL;  Surgeon: Toney Reil, MD;  Location[facility] ENDOSCOPY;  Service: Gastroenterology;  Laterality: N/A;   ESOPHAGOGASTRODUODENOSCOPY N/A 07/19/2016   Procedure: ESOPHAGOGASTRODUODENOSCOPY (EGD);  Surgeon: Iva Boop, MD;  Location: [facility] ENDOSCOPY;  Service: Endoscopy;  Laterality: N/A;   ESOPHAGOGASTRODUODENOSCOPY (EGD) WITH PROPOFOL N/A 08/23/2018   Procedure: ESOPHAGOGASTRODUODENOSCOPY (EGD) WITH PROPOFOL;  Surgeon: Toney Reil, MD;  Location: [facility] ENDOSCOPY;  Service: Gastroenterology;  Laterality: N/A;   ROTATOR CUFF REPAIR Left 2011   ROTATOR CUFF REPAIR Right 2013    Family History  Problem Relation Age of Onset   Heart disease Mother    Colon cancer Father    Cancer Sister    Breast cancer Sister 55   Cancer Sister     Social History   Tobacco Use   Smoking status: Former    Current packs/day: 0.00    Average packs/day: 0.5 packs/day for 51.4 years (25.7 ttl pk-yrs)    Types: Cigarettes    Start date: 09/30/1970    Quit date: 03/12/2022    Years since quitting: 1.3   Smokeless tobacco: Never  Substance Use Topics   Alcohol use: Yes    Alcohol/week: 0.0 standard drinks of alcohol    Comment: Rare/occassional      Current Outpatient Medications:    atorvastatin (LIPITOR) 40 MG tablet, Take 1 tablet (40 mg total) by mouth daily., Disp: 90 tablet, Rfl: 3   azelastine (ASTELIN) 0.1 % nasal spray, Place 1 spray into both nostrils 2 (two) times daily. Use in each nostril as directed, Disp: 90 mL, Rfl: 1   Ibuprofen 200 MG CAPS, Take by mouth., Disp: , Rfl:    loratadine (CLARITIN) 10 MG tablet, Take 1 tablet (10 mg total) by mouth 2 (two) times daily., Disp: 180 tablet, Rfl: 1   Vitamin D, Ergocalciferol, (DRISDOL) 1.25 MG (50000 UNIT) CAPS capsule, TAKE 1 CAPSULE BY MOUTH EVERY 7  DAYS, Disp: 12 capsule, Rfl: 3    amLODipine-benazepril (LOTREL) 5-20 MG capsule, Take 1 capsule by mouth daily., Disp: 90 capsule, Rfl: 1   Fluticasone-Umeclidin-Vilant (TRELEGY ELLIPTA) 100-62.5-25 MCG/ACT AEPB, Inhale 1 puff into the lungs daily., Disp: 1 each, Rfl: 5   tiZANidine (ZANAFLEX) 4 MG tablet, Take 1 tablet (4 mg total) by mouth 3 (three) times daily., Disp: 270 tablet, Rfl: 1   zolpidem (AMBIEN CR) 6.25 MG CR tablet, Take 1 tablet (6.25 mg total) by mouth at bedtime as needed. for sleep, Disp: 90 tablet, Rfl: 0  Allergies  Allergen Reactions   Naproxen Sodium Rash and Other (See Comments)    800 mg tablets that the MD prescribed for pain.    I personally reviewed active problem list, medication list, allergies, family history with the patient/caregiver today.  ROS  Ten systems reviewed and is negative except as mentioned in HPI    Objective  Vitals:   07/18/23 0914  BP: 130/64  Pulse: 90  Resp: 18  Temp: 97.9 F (36.6 C)  TempSrc: Oral  SpO2: 95%  Weight: 272 lb 11.2 oz (123.7 kg)  Height: 6' (1.829 m)    Body mass index is 36.98 kg/m.  Physical Exam  Constitutional: Patient appears well-developed and well-nourished. Obese  No distress.  HEENT: head atraumatic, normocephalic, pupils equal and reactive to light, neck supple Cardiovascular: Normal rate, regular rhythm and normal heart sounds.  No murmur heard. No BLE edema. Pulmonary/Chest: Effort normal and breath sounds normal. No respiratory distress. Abdominal: Soft.  There is no tenderness. Muscular skeletal: decrease rom of lumbar spine  Psychiatric: Patient has a normal mood and affect. behavior is normal. Judgment and thought content normal.   Recent Results (from the past 2160 hour(s))  Lipid panel     Status: None   Collection Time: 07/04/23  9:39 AM  Result Value Ref Range   Cholesterol 154 <200 mg/dL   HDL 41 > OR = 40 mg/dL   Triglycerides 469 <629 mg/dL   LDL Cholesterol (Calc) 91 mg/dL (calc)    Comment: Reference  range: <100 . Desirable range <100 mg/dL for primary prevention;   <70 mg/dL for patients with CHD or diabetic patients  with > or = 2 CHD risk factors. Marland Kitchen LDL-C is now calculated using the Martin-Hopkins  calculation, which is a validated novel method providing  better accuracy than the Friedewald equation in the  estimation of LDL-C.  Horald Pollen et al. Lenox Ahr. 5284;132(44): 2061-2068  (http://education.[facility].com/faq/FAQ164)    Total CHOL/HDL Ratio 3.8 <5.0 (calc)   Non-HDL Cholesterol (Calc) 113 <130 mg/dL (calc)    Comment: For patients with diabetes plus 1 major ASCVD risk  factor, treating to a non-HDL-C goal of <100 mg/dL  (LDL-C of <01 mg/dL) is considered a therapeutic  option.   CBC with Differential/Platelet     Status: Abnormal   Collection Time: 07/04/23  9:39 AM  Result Value Ref Range   WBC 9.5 3.8 - 10.8 Thousand/uL   RBC 5.64 4.20 - 5.80 Million/uL   Hemoglobin 17.2 (H) 13.2 - 17.1 g/dL   HCT 02.7 (H) 25.3 - 66.4 %   MCV 89.7 80.0 - 100.0 fL   MCH 30.5 27.0 - 33.0 pg   MCHC 34.0 32.0 - 36.0 g/dL   RDW 40.3 47.4 - 25.9 %   Platelets 255 140 - 400 Thousand/uL   MPV 11.3 7.5 - 12.5 fL   Neutro Abs 5,244 1,500 - 7,800 cells/uL   Lymphs Abs 3,230 850 - 3,900 cells/uL   Absolute Monocytes 779 200 - 950 cells/uL   Eosinophils Absolute 181 15 - 500 cells/uL   Basophils Absolute 67 0 - 200 cells/uL   Neutrophils Relative % 55.2 %   Total Lymphocyte 34.0 %   Monocytes Relative 8.2 %   Eosinophils Relative 1.9 %   Basophils Relative 0.7 %  COMPLETE METABOLIC PANEL WITH GFR     Status: Abnormal   Collection Time: 07/04/23  9:39 AM  Result Value Ref Range   Glucose, Bld 113 (H) 65 - 99 mg/dL    Comment: .            Fasting reference interval . For someone without known diabetes, a glucose value between 100 and 125 mg/dL is consistent with prediabetes and should be confirmed with a follow-up test. .  BUN 14 7 - 25 mg/dL   Creat 9.62 9.52 - 8.41  mg/dL   eGFR 99 > OR = 60 LK/GMW/1.02V2   BUN/Creatinine Ratio SEE NOTE: 6 - 22 (calc)    Comment:    Not Reported: BUN and Creatinine are within    reference range. .    Sodium 140 135 - 146 mmol/L   Potassium 4.5 3.5 - 5.3 mmol/L   Chloride 104 98 - 110 mmol/L   CO2 27 20 - 32 mmol/L   Calcium 9.4 8.6 - 10.3 mg/dL   Total Protein 6.8 6.1 - 8.1 g/dL   Albumin 4.4 3.6 - 5.1 g/dL   Globulin 2.4 1.9 - 3.7 g/dL (calc)   AG Ratio 1.8 1.0 - 2.5 (calc)   Total Bilirubin 0.4 0.2 - 1.2 mg/dL   Alkaline phosphatase (APISO) 69 35 - 144 U/L   AST 22 10 - 35 U/L   ALT 31 9 - 46 U/L  Hemoglobin A1c     Status: Abnormal   Collection Time: 07/04/23  9:39 AM  Result Value Ref Range   Hgb A1c MFr Bld 6.5 (H) <5.7 % of total Hgb    Comment: For someone without known diabetes, a hemoglobin A1c value of 6.5% or greater indicates that they may have  diabetes and this should be confirmed with a follow-up  test. . For someone with known diabetes, a value <7% indicates  that their diabetes is well controlled and a value  greater than or equal to 7% indicates suboptimal  control. A1c targets should be individualized based on  duration of diabetes, age, comorbid conditions, and  other considerations. . Currently, no consensus exists regarding use of hemoglobin A1c for diagnosis of diabetes for children. .    Mean Plasma Glucose 140 mg/dL   eAG (mmol/L) 7.7 mmol/L    Comment: . This test was performed on the Roche cobas c503 platform. Effective 09/19/22, a change in test platforms from the Abbott Architect to the Roche cobas c503 may have shifted HbA1c results compared to historical results. Based on laboratory validation testing conducted at [facility], the Roche platform relative to the Abbott platform had an average increase in HbA1c value of < or = 0.3%. This difference is within accepted  variability established by the [facility]. Note that not all  individuals will have had a shift in their results and direct comparisons between historical and current results for testing conducted on different platforms is not recommended.   VITAMIN D 25 Hydroxy (Vit-D Deficiency, Fractures)     Status: None   Collection Time: 07/04/23  9:39 AM  Result Value Ref Range   Vit D, 25-Hydroxy 35 30 - 100 ng/mL    Comment: Vitamin D Status         25-OH Vitamin D: . Deficiency:                    <20 ng/mL Insufficiency:             20 - 29 ng/mL Optimal:                 > or = 30 ng/mL . For 25-OH Vitamin D testing on patients on  D2-supplementation and patients for whom quantitation  of D2 and D3 fractions is required, the [facility]ureD(TM) 25-OH VIT D, (D2,D3), LC/MS/MS is recommended: order  code 53664 (patients >37yrs). . See Note 1 . Note 1 . For additional information, please refer to  http://education.[facility].com/faq/FAQ199  (  This link is being provided for informational/ educational purposes only.)   B12 and Folate Panel     Status: None   Collection Time: 07/04/23  9:39 AM  Result Value Ref Range   Vitamin B-12 296 200 - 1,100 pg/mL    Comment: . Please Note: Although the reference range for vitamin B12 is [phone] pg/mL, it has been reported that between 5 and 10% of patients with values between 200 and 400 pg/mL may experience neuropsychiatric and hematologic abnormalities due to occult B12 deficiency; less than 1% of patients with values above 400 pg/mL will have symptoms. .    Folate 16.7 ng/mL    Comment:                            Reference Range                            Low:           <3.4                            Borderline:    3.4-5.4                            Normal:        >5.4 .   Vitamin B1     Status: None   Collection Time: 07/04/23  9:39 AM  Result Value Ref Range   Vitamin B1 (Thiamine) 9 8 - 30 nmol/L    Comment: (Note) Vitamin supplementation within 24 hours prior to blood  draw may  affect the accuracy of the results. . This test was developed and its analytical performance  characteristics have been determined by [employer]. It has not been cleared or approved by FDA.  This assay has been validated pursuant to the CLIA  regulations and is used for clinical purposes. . [facility] [address] [place] [address] [phone] Junita Push L. Thompson Caul, MD, PhD   B Nat Peptide     Status: None   Collection Time: 07/04/23  9:39 AM  Result Value Ref Range   Brain Natriuretic Peptide 6 <100 pg/mL    Comment: . BNP levels increase with age in the general population with the highest values seen in individuals greater than 80 years of age. Reference: J. Am. Ladon Applebaum. Cardiol. 2002; 91:478-295. Marland Kitchen   PSA     Status: None   Collection Time: 07/04/23  9:39 AM  Result Value Ref Range   PSA 1.06 < OR = 4.00 ng/mL    Comment: The total PSA value from this assay system is  standardized against the WHO standard. The test  result will be approximately 20% lower when compared  to the equimolar-standardized total PSA (Beckman  Coulter). Comparison of serial PSA results should be  interpreted with this fact in mind. . This test was performed using the Siemens  chemiluminescent method. Values obtained from  different assay methods cannot be used interchangeably. PSA levels, regardless of value, should not be interpreted as absolute evidence of the presence or absence of disease.     Diabetic Foot Exam: Diabetic Foot Exam - Simple   Simple Foot Form Visual Inspection See comments: Yes Sensation Testing Intact to touch and monofilament testing bilaterally: Yes Pulse Check Posterior Tibialis  and Dorsalis pulse intact bilaterally: Yes Comments Dry heels and thick toenails      PHQ2/9:    07/18/2023    9:16 AM 07/04/2023    8:17 AM 01/11/2023    8:17 AM 10/11/2022    8:27 AM 08/23/2022    1:57 PM  Depression screen PHQ 2/9   Decreased Interest 0 0 1 0 0  Down, Depressed, Hopeless 0 0 1 0 0  PHQ - 2 Score 0 0 2 0 0  Altered sleeping 0 0 0 0 0  Tired, decreased energy 0 0 0 0 0  Change in appetite 0 0 0 0 0  Feeling bad or failure about yourself  0 0 0 0 0  Trouble concentrating 0 0 0 0 0  Moving slowly or fidgety/restless 0 0 0 0 0  Suicidal thoughts 0 0 0 0 0  PHQ-9 Score 0 0 2 0 0  Difficult doing work/chores     Not difficult at all    phq 9 is negative   Fall Risk:    07/18/2023    9:16 AM 07/04/2023    8:16 AM 01/11/2023    8:17 AM 10/11/2022    8:27 AM 08/23/2022    1:57 PM  Fall Risk   Falls in the past year? 0 0 0 1 1  Number falls in past yr:   0 1 0  Injury with Fall?   0 0 1  Risk for fall due to : No Fall Risks No Fall Risks No Fall Risks History of fall(s)   Follow up Falls prevention discussed Falls prevention discussed;Education provided;Falls evaluation completed Falls prevention discussed Falls prevention discussed;Education provided;Falls evaluation completed      Functional Status Survey: Is the patient deaf or have difficulty hearing?: Yes Does the patient have difficulty seeing, even when wearing glasses/contacts?: No Does the patient have difficulty concentrating, remembering, or making decisions?: Yes Does the patient have difficulty walking or climbing stairs?: Yes Does the patient have difficulty dressing or bathing?: No Does the patient have difficulty doing errands alone such as visiting a doctor's office or shopping?: No    Assessment & Plan  1. Morbid obesity (HCC)  BMI over 35 with co-morbidities such as HTN, dyslipidemia, trying to lose weight   2. Senile purpura (HCC)  Reassurance given   3. Seasonal affective disorder (HCC)  Doing well since Summer time   4. Simple chronic bronchitis (HCC)  - Fluticasone-Umeclidin-Vilant (TRELEGY ELLIPTA) 100-62.5-25 MCG/ACT AEPB; Inhale 1 puff into the lungs daily.  Dispense: 1 each; Refill: 5  5. History of  alcoholism (HCC)  Unchanged   6. Diabetes mellitus type 2, diet-controlled (HCC)  Discussed diet, yearly eye exam and urine micro   7. Essential hypertension  - amLODipine-benazepril (LOTREL) 5-20 MG capsule; Take 1 capsule by mouth daily.  Dispense: 90 capsule; Refill: 1  8. Chronic pain syndrome  - tiZANidine (ZANAFLEX) 4 MG tablet; Take 1 tablet (4 mg total) by mouth 3 (three) times daily.  Dispense: 270 tablet; Refill: 1  9. B12 deficiency  Getting B12 injections   10. Vitamin D deficiency  Continue supplementation   11. Insomnia, unspecified type  - zolpidem (AMBIEN CR) 6.25 MG CR tablet; Take 1 tablet (6.25 mg total) by mouth at bedtime as needed. for sleep  Dispense: 90 tablet; Refill: 0

## 2023-07-18 ENCOUNTER — Ambulatory Visit: Payer: Medicare HMO | Admitting: Family Medicine

## 2023-07-18 ENCOUNTER — Encounter: Payer: Self-pay | Admitting: Family Medicine

## 2023-07-18 DIAGNOSIS — F5104 Psychophysiologic insomnia: Secondary | ICD-10-CM | POA: Insufficient documentation

## 2023-07-18 DIAGNOSIS — E538 Deficiency of other specified B group vitamins: Secondary | ICD-10-CM | POA: Diagnosis not present

## 2023-07-18 DIAGNOSIS — G4700 Insomnia, unspecified: Secondary | ICD-10-CM

## 2023-07-18 DIAGNOSIS — J410 Simple chronic bronchitis: Secondary | ICD-10-CM

## 2023-07-18 DIAGNOSIS — D692 Other nonthrombocytopenic purpura: Secondary | ICD-10-CM | POA: Diagnosis not present

## 2023-07-18 DIAGNOSIS — F1021 Alcohol dependence, in remission: Secondary | ICD-10-CM | POA: Diagnosis not present

## 2023-07-18 DIAGNOSIS — E1169 Type 2 diabetes mellitus with other specified complication: Secondary | ICD-10-CM | POA: Diagnosis not present

## 2023-07-18 DIAGNOSIS — E119 Type 2 diabetes mellitus without complications: Secondary | ICD-10-CM

## 2023-07-18 DIAGNOSIS — I10 Essential (primary) hypertension: Secondary | ICD-10-CM | POA: Diagnosis not present

## 2023-07-18 DIAGNOSIS — G894 Chronic pain syndrome: Secondary | ICD-10-CM | POA: Diagnosis not present

## 2023-07-18 DIAGNOSIS — F338 Other recurrent depressive disorders: Secondary | ICD-10-CM | POA: Diagnosis not present

## 2023-07-18 DIAGNOSIS — E559 Vitamin D deficiency, unspecified: Secondary | ICD-10-CM

## 2023-07-18 HISTORY — DX: Type 2 diabetes mellitus without complications: E11.9

## 2023-07-18 MED ORDER — AMLODIPINE BESY-BENAZEPRIL HCL 5-20 MG PO CAPS
1.0000 | ORAL_CAPSULE | Freq: Every day | ORAL | 1 refills | Status: DC
Start: 2023-07-18 — End: 2023-12-25

## 2023-07-18 MED ORDER — ZOLPIDEM TARTRATE ER 6.25 MG PO TBCR
6.2500 mg | EXTENDED_RELEASE_TABLET | Freq: Every evening | ORAL | 0 refills | Status: DC | PRN
Start: 2023-07-18 — End: 2023-07-19

## 2023-07-18 MED ORDER — TRELEGY ELLIPTA 100-62.5-25 MCG/ACT IN AEPB
1.0000 | INHALATION_SPRAY | Freq: Every day | RESPIRATORY_TRACT | 5 refills | Status: DC
Start: 2023-07-18 — End: 2024-08-28

## 2023-07-18 MED ORDER — TIZANIDINE HCL 4 MG PO TABS
4.0000 mg | ORAL_TABLET | Freq: Three times a day (TID) | ORAL | 1 refills | Status: DC
Start: 2023-07-18 — End: 2023-12-25

## 2023-07-19 ENCOUNTER — Telehealth: Payer: Self-pay | Admitting: Family Medicine

## 2023-07-19 ENCOUNTER — Other Ambulatory Visit: Payer: Self-pay | Admitting: Family Medicine

## 2023-07-19 MED ORDER — ZOLPIDEM TARTRATE 5 MG PO TABS: 5.0000 mg | ORAL_TABLET | Freq: Every day | ORAL | 0 refills | Status: DC

## 2023-07-19 NOTE — Telephone Encounter (Signed)
Pt called in requesting, that Ambien be changed from 6.25 to 5mg . He states insurance wont pay for 6.25.

## 2023-08-01 NOTE — Progress Notes (Deleted)
Patient: Carl Burke, Male    DOB: Oct 12, 1956, 67 y.o.   MRN: [id number]  Visit Date: 08/01/2023  Today's Provider: Ruel Favors, MD   AWV  Subjective:   Carl Burke is a 67 y.o. male who presents today for his Subsequent Annual Wellness Visit.  Patient/Caregiver input:    HPI  IPSS Questionnaire (AUA-7): Over the past month.   1)  How often have you had a sensation of not emptying your bladder completely after you finish urinating?  {Rating:19227}  2)  How often have you had to urinate again less than two hours after you finished urinating? {Rating:19227}  3)  How often have you found you stopped and started again several times when you urinated?  {Rating:19227}  4) How difficult have you found it to postpone urination?  {Rating:19227}  5) How often have you had a weak urinary stream?  {Rating:19227}  6) How often have you had to push or strain to begin urination?  {Rating:19227}  7) How many times did you most typically get up to urinate from the time you went to bed until the time you got up in the morning?  {Rating:19228}  Total score:  0-7 mildly symptomatic   8-19 moderately symptomatic   20-35 severely symptomatic     Review of Systems Constitutional: Negative for fever or weight change.  Respiratory: Negative for cough and shortness of breath.   Cardiovascular: Negative for chest pain or palpitations.  Gastrointestinal: Negative for abdominal pain, no bowel changes.  Musculoskeletal: Negative for gait problem or joint swelling.  Skin: Negative for rash.  Neurological: Negative for dizziness or headache.  No other specific complaints in a complete review of systems (except as listed in HPI above).  Past Medical History:  Diagnosis Date   Acute blood loss anemia 07/17/2016   Acute duodenal ulcer with bleeding 07/17/2016   Arthritis    Colon cancer screening    Diabetes mellitus type 2, diet-controlled (HCC) 07/18/2023   Duodenal ulcer due to bacteria     Helicobacter pylori gastritis    Helicobacter pylori gastritis    Rx 1) PPI pantoprazole 40 mg qd x 2 mos 2) Pepto Bismol 2 tabs (262 mg each) 4 times a day x 14 d 3) Metronidazole 250 mg 4 times a day x 14 d 4) doxycycline 100 mg 2 times a day x 14 d   In 4 weeks after treatment completed do H. Pylori stool antigen - dx H. Pylori gastritis    Helicobacter pylori infection    Hyperlipidemia    Hypertension    Long term prescription opiate use 04/12/2016   Lumbosacral radiculopathy at S1 (Left) 04/12/2016   Opiate use 04/12/2016   Opiate use (300 MME/Day) 04/12/2016   Fentanyl patch 100 g per hour every 72 hours + oxycodone/APAP 10/325 one every 6 hours (40 mg/day).    Seasonal affective disorder (HCC) 01/19/2021    Past Surgical History:  Procedure Laterality Date   COLONOSCOPY     COLONOSCOPY WITH PROPOFOL N/A 08/23/2018   Procedure: COLONOSCOPY WITH PROPOFOL;  Surgeon: Toney Reil, MD;  Location: [facility] ENDOSCOPY;  Service: Gastroenterology;  Laterality: N/A;   ESOPHAGOGASTRODUODENOSCOPY N/A 07/19/2016   Procedure: ESOPHAGOGASTRODUODENOSCOPY (EGD);  Surgeon: Iva Boop, MD;  Location[facility] ENDOSCOPY;  Service: Endoscopy;  Laterality: N/A;   ESOPHAGOGASTRODUODENOSCOPY (EGD) WITH PROPOFOL N/A 08/23/2018   Procedure: ESOPHAGOGASTRODUODENOSCOPY (EGD) WITH PROPOFOL;  Surgeon: Toney Reil, MD;  Location: [facility] ENDOSCOPY;  Service: Gastroenterology;  Laterality: N/A;   ROTATOR  CUFF REPAIR Left 2011   ROTATOR CUFF REPAIR Right 2013    Family History  Problem Relation Age of Onset   Heart disease Mother    Colon cancer Father    Cancer Sister    Breast cancer Sister 6   Cancer Sister     Social History   Socioeconomic History   Marital status: Married    Spouse name: Alona Bene   Number of children: 3   Years of education: Not on file   Highest education level: Not on file  Occupational History   Occupation: retired   Tobacco Use   Smoking status: Former    Current packs/day:  0.00    Average packs/day: 0.5 packs/day for 51.4 years (25.7 ttl pk-yrs)    Types: Cigarettes    Start date: 09/30/1970    Quit date: 03/12/2022    Years since quitting: 1.3   Smokeless tobacco: Never  Vaping Use   Vaping status: Never Used  Substance and Sexual Activity   Alcohol use: Yes    Alcohol/week: 0.0 standard drinks of alcohol    Comment: Rare/occassional    Drug use: No   Sexual activity: Yes    Partners: Female  Other Topics Concern   Not on file  Social History Narrative   He retired, 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 but does not drink, just to taste it   He used to be very active, boxer   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  (07/04/2023)   Overall Financial Resource Strain (CARDIA)    Difficulty of Paying Living Expenses: Not hard at all  Food Insecurity: No Food Insecurity (07/04/2023)   Hunger Vital Sign    Worried About Running Out of Food in the Last Year: Never true    Ran Out of Food in the Last Year: Never true  Transportation Needs: No Transportation Needs (07/04/2023)   PRAPARE - Administrator, Civil Service (Medical): No    Lack of Transportation (Non-Medical): No  Physical Activity: Inactive (07/04/2023)   Exercise Vital Sign    Days of Exercise per Week: 0 days    Minutes of Exercise per Session: 0 min  Stress: Stress Concern Present (07/04/2023)   [employer] of Occupational Health - Occupational Stress Questionnaire    Feeling of Stress : To some extent  Social Connections: Moderately Isolated (07/04/2023)   Social Connection and Isolation Panel [NHANES]    Frequency of Communication with Friends and Family: More than three times a week    Frequency of Social Gatherings with Friends and Family: Never    Attends Religious Services: Never    Database administrator or Organizations: No    Attends Banker Meetings: Never    Marital Status: Married  Catering manager Violence: Not At  Risk (07/04/2023)   Humiliation, Afraid, Rape, and Kick questionnaire    Fear of Current or Ex-Partner: No    Emotionally Abused: No    Physically Abused: No    Sexually Abused: No    Outpatient Encounter Medications as of 08/02/2023  Medication Sig   amLODipine-benazepril (LOTREL) 5-20 MG capsule Take 1 capsule by mouth daily.   atorvastatin (LIPITOR) 40 MG tablet Take 1 tablet (40 mg total) by mouth daily.   azelastine (ASTELIN) 0.1 % nasal spray Place 1 spray into both nostrils 2 (two) times daily. Use in each nostril as directed   Fluticasone-Umeclidin-Vilant (TRELEGY ELLIPTA) 100-62.5-25 MCG/ACT AEPB Inhale 1 puff  into the lungs daily.   Ibuprofen 200 MG CAPS Take by mouth.   loratadine (CLARITIN) 10 MG tablet Take 1 tablet (10 mg total) by mouth 2 (two) times daily.   tiZANidine (ZANAFLEX) 4 MG tablet Take 1 tablet (4 mg total) by mouth 3 (three) times daily.   Vitamin D, Ergocalciferol, (DRISDOL) 1.25 MG (50000 UNIT) CAPS capsule TAKE 1 CAPSULE BY MOUTH EVERY 7  DAYS   zolpidem (AMBIEN) 5 MG tablet Take 1 tablet (5 mg total) by mouth at bedtime.   No facility-administered encounter medications on file as of 08/02/2023.    Allergies  Allergen Reactions   Naproxen Sodium Rash and Other (See Comments)    800 mg tablets that the MD prescribed for pain.    Care Team Updated in EHR: {Yes/No:30480221}  Last Vision Exam: *** Wears corrective lenses: {Yes/No:30480221} Last Dental Exam: *** Last Hearing Exam: *** Wears Hearing Aids: {Yes/No:30480221}  Functional Ability / Safety Screening 1.  Was the timed Get Up and Go test longer than 30 seconds?  {Blank single:19197::"yes","no"} 2.  Does the patient need help with the phone, transportation, shopping,      preparing meals, housework, laundry, medications, or managing money?  {Blank single:19197::"yes","no"} 3.  Does the patient's home have:  loose throw rugs in the hallway?   {Blank single:19197::"yes","no"}      Grab bars in the  bathroom? {Blank single:19197::"yes","no"}      Handrails on the stairs?   {Blank single:19197::"yes","no"}      Poor lighting?   {Blank single:19197::"yes","no"} 4.  Has the patient noticed any hearing difficulties?   {Blank single:19197::"yes","no"}  Diet Recall and Exercise Regimen: ***  Fall Risk: *** See screening under Objective Information  Depression Screen: *** See screening under Objective Information  Advanced Directives: A voluntary discussion about advance care planning including the explanation and discussion of advance directives was discussed with the patient. Explanation about the health care proxy and living will was reviewed.  During this discussion, the patient was able to identify a health care proxy as *** and plans/does not plan to fill out the paperwork required and will bring this to our office to keep on file. Does patient have a HCPOA?    {Blank single:19197::"yes","no"} If yes, name and contact information:  Does patient have a living will or MOST form?  {Blank single:19197::"yes","no"}  Cancer Screenings: Skin: *** Lung: Low Dose CT Chest recommended if Age 35-80 years, 30 pack-year currently smoking OR have quit w/in 15years. Patient does not qualify.  Lifestyle risk factor issued reviewed: Diet, exercise, weight management, advised patient smoking is not healthy, nutrition/diet.   Prostate: *** Colon: Colonoscopy 08/23/18  Additional Screenings: Hepatitis B/HIV/Syphillis: 07/18/18 Hepatitis C Screening: 01/03/19 Intimate Partner Violence: Negative AAA Screen: Men age 71 to 75 years if ever smoked recommended to get a one time AAA ultrasound screening exam. Patient does not qualify.  Objective:   Vitals: There were no vitals taken for this visit. There is no height or weight on file to calculate BMI.  Lab Results  Component Value Date   CHOL 154 07/04/2023   CHOL 175 07/25/2022   CHOL 156 05/07/2021   Lab Results  Component Value Date   HDL 41  07/04/2023   HDL 36 (L) 07/25/2022   HDL 35 (L) 05/07/2021   Lab Results  Component Value Date   LDLCALC 91 07/04/2023   LDLCALC 97 07/25/2022   LDLCALC 87 05/07/2021   Lab Results  Component Value Date   TRIG 123  07/04/2023   TRIG 244 (H) 07/25/2022   TRIG 200 (H) 05/07/2021   Lab Results  Component Value Date   CHOLHDL 3.8 07/04/2023   CHOLHDL 4.9 07/25/2022   CHOLHDL 4.5 05/07/2021   No results found for: "LDLDIRECT"  No results found.  Physical Exam Constitutional: Patient appears well-developed and well-nourished. Obese *** No distress.  HEENT: head atraumatic, normocephalic, pupils equal and reactive to light, ears ***, neck supple, throat within normal limits Cardiovascular: Normal rate, regular rhythm and normal heart sounds.  No murmur heard. No BLE edema. Pulmonary/Chest: Effort normal and breath sounds normal. No respiratory distress. Abdominal: Soft.  There is no tenderness. Psychiatric: Patient has a normal mood and affect. behavior is normal. Judgment and thought content normal.   Cognitive Testing - 6-CIT  Correct? Score   What year is it? {YES NO:22349} {Numbers; 0-4:31231} Yes = 0    No = 4  What month is it? {YES NO:22349} {Numbers; 0-4:31231} Yes = 0    No = 3  Remember:     Floyde Parkins, [address], [place]     What time is it? {YES NO:22349} {Numbers; 0-4:31231} Yes = 0    No = 3  Count backwards from 20 to 1 {YES NO:22349} {Numbers; 0-4:31231} Correct = 0    1 error = 2   More than 1 error = 4  Say the months of the year in reverse. {YES NO:22349} {Numbers; 0-4:31231} Correct = 0    1 error = 2   More than 1 error = 4  What address did I ask you to remember? {YES NO:22349} {NUMBERS; 0-10:5044} Correct = 0  1 error = 2    2 error = 4    3 error = 6    4 error = 8    All wrong = 10       TOTAL SCORE  {Numbers; 8-65:78469}/62   Interpretation:  {Desc; normal/abnormal:11317::"Normal"}  Normal (0-7) Abnormal (8-28)   Fall Risk:    07/18/2023     9:16 AM 07/04/2023    8:16 AM 01/11/2023    8:17 AM 10/11/2022    8:27 AM 08/23/2022    1:57 PM  Fall Risk   Falls in the past year? 0 0 0 1 1  Number falls in past yr:   0 1 0  Injury with Fall?   0 0 1  Risk for fall due to : No Fall Risks No Fall Risks No Fall Risks History of fall(s)   Follow up Falls prevention discussed Falls prevention discussed;Education provided;Falls evaluation completed Falls prevention discussed Falls prevention discussed;Education provided;Falls evaluation completed     Depression Screen    07/18/2023    9:16 AM 07/04/2023    8:17 AM 01/11/2023    8:17 AM 10/11/2022    8:27 AM 08/23/2022    1:57 PM  Depression screen PHQ 2/9  Decreased Interest 0 0 1 0 0  Down, Depressed, Hopeless 0 0 1 0 0  PHQ - 2 Score 0 0 2 0 0  Altered sleeping 0 0 0 0 0  Tired, decreased energy 0 0 0 0 0  Change in appetite 0 0 0 0 0  Feeling bad or failure about yourself  0 0 0 0 0  Trouble concentrating 0 0 0 0 0  Moving slowly or fidgety/restless 0 0 0 0 0  Suicidal thoughts 0 0 0 0 0  PHQ-9 Score 0 0 2 0 0  Difficult doing  work/chores     Not difficult at all    Recent Results (from the past 2160 hour(s))  Lipid panel     Status: None   Collection Time: 07/04/23  9:39 AM  Result Value Ref Range   Cholesterol 154 <200 mg/dL   HDL 41 > OR = 40 mg/dL   Triglycerides 161 <096 mg/dL   LDL Cholesterol (Calc) 91 mg/dL (calc)    Comment: Reference range: <100 . Desirable range <100 mg/dL for primary prevention;   <70 mg/dL for patients with CHD or diabetic patients  with > or = 2 CHD risk factors. Marland Kitchen LDL-C is now calculated using the Martin-Hopkins  calculation, which is a validated novel method providing  better accuracy than the Friedewald equation in the  estimation of LDL-C.  Horald Pollen et al. Lenox Ahr. 0454;098(11): 2061-2068  (http://education.[facility].com/faq/FAQ164)    Total CHOL/HDL Ratio 3.8 <5.0 (calc)   Non-HDL Cholesterol (Calc) 113 <130 mg/dL (calc)     Comment: For patients with diabetes plus 1 major ASCVD risk  factor, treating to a non-HDL-C goal of <100 mg/dL  (LDL-C of <91 mg/dL) is considered a therapeutic  option.   CBC with Differential/Platelet     Status: Abnormal   Collection Time: 07/04/23  9:39 AM  Result Value Ref Range   WBC 9.5 3.8 - 10.8 Thousand/uL   RBC 5.64 4.20 - 5.80 Million/uL   Hemoglobin 17.2 (H) 13.2 - 17.1 g/dL   HCT 47.8 (H) 29.5 - 62.1 %   MCV 89.7 80.0 - 100.0 fL   MCH 30.5 27.0 - 33.0 pg   MCHC 34.0 32.0 - 36.0 g/dL   RDW 30.8 65.7 - 84.6 %   Platelets 255 140 - 400 Thousand/uL   MPV 11.3 7.5 - 12.5 fL   Neutro Abs 5,244 1,500 - 7,800 cells/uL   Lymphs Abs 3,230 850 - 3,900 cells/uL   Absolute Monocytes 779 200 - 950 cells/uL   Eosinophils Absolute 181 15 - 500 cells/uL   Basophils Absolute 67 0 - 200 cells/uL   Neutrophils Relative % 55.2 %   Total Lymphocyte 34.0 %   Monocytes Relative 8.2 %   Eosinophils Relative 1.9 %   Basophils Relative 0.7 %  COMPLETE METABOLIC PANEL WITH GFR     Status: Abnormal   Collection Time: 07/04/23  9:39 AM  Result Value Ref Range   Glucose, Bld 113 (H) 65 - 99 mg/dL    Comment: .            Fasting reference interval . For someone without known diabetes, a glucose value between 100 and 125 mg/dL is consistent with prediabetes and should be confirmed with a follow-up test. .    BUN 14 7 - 25 mg/dL   Creat 9.62 9.52 - 8.41 mg/dL   eGFR 99 > OR = 60 LK/GMW/1.02V2   BUN/Creatinine Ratio SEE NOTE: 6 - 22 (calc)    Comment:    Not Reported: BUN and Creatinine are within    reference range. .    Sodium 140 135 - 146 mmol/L   Potassium 4.5 3.5 - 5.3 mmol/L   Chloride 104 98 - 110 mmol/L   CO2 27 20 - 32 mmol/L   Calcium 9.4 8.6 - 10.3 mg/dL   Total Protein 6.8 6.1 - 8.1 g/dL   Albumin 4.4 3.6 - 5.1 g/dL   Globulin 2.4 1.9 - 3.7 g/dL (calc)   AG Ratio 1.8 1.0 - 2.5 (calc)   Total Bilirubin 0.4 0.2 -  1.2 mg/dL   Alkaline phosphatase (APISO) 69 35 - 144  U/L   AST 22 10 - 35 U/L   ALT 31 9 - 46 U/L  Hemoglobin A1c     Status: Abnormal   Collection Time: 07/04/23  9:39 AM  Result Value Ref Range   Hgb A1c MFr Bld 6.5 (H) <5.7 % of total Hgb    Comment: For someone without known diabetes, a hemoglobin A1c value of 6.5% or greater indicates that they may have  diabetes and this should be confirmed with a follow-up  test. . For someone with known diabetes, a value <7% indicates  that their diabetes is well controlled and a value  greater than or equal to 7% indicates suboptimal  control. A1c targets should be individualized based on  duration of diabetes, age, comorbid conditions, and  other considerations. . Currently, no consensus exists regarding use of hemoglobin A1c for diagnosis of diabetes for children. .    Mean Plasma Glucose 140 mg/dL   eAG (mmol/L) 7.7 mmol/L    Comment: . This test was performed on the Roche cobas c503 platform. Effective 09/19/22, a change in test platforms from the Abbott Architect to the Roche cobas c503 may have shifted HbA1c results compared to historical results. Based on laboratory validation testing conducted at [facility], the Roche platform relative to the Abbott platform had an average increase in HbA1c value of < or = 0.3%. This difference is within accepted  variability established by the [facility]. Note that not all individuals will have had a shift in their results and direct comparisons between historical and current results for testing conducted on different platforms is not recommended.   VITAMIN D 25 Hydroxy (Vit-D Deficiency, Fractures)     Status: None   Collection Time: 07/04/23  9:39 AM  Result Value Ref Range   Vit D, 25-Hydroxy 35 30 - 100 ng/mL    Comment: Vitamin D Status         25-OH Vitamin D: . Deficiency:                    <20 ng/mL Insufficiency:             20 - 29 ng/mL Optimal:                 > or = 30 ng/mL . For 25-OH  Vitamin D testing on patients on  D2-supplementation and patients for whom quantitation  of D2 and D3 fractions is required, the [facility]ureD(TM) 25-OH VIT D, (D2,D3), LC/MS/MS is recommended: order  code 16109 (patients >49yrs). . See Note 1 . Note 1 . For additional information, please refer to  http://education.[facility].com/faq/FAQ199  (This link is being provided for informational/ educational purposes only.)   B12 and Folate Panel     Status: None   Collection Time: 07/04/23  9:39 AM  Result Value Ref Range   Vitamin B-12 296 200 - 1,100 pg/mL    Comment: . Please Note: Although the reference range for vitamin B12 is [phone] pg/mL, it has been reported that between 5 and 10% of patients with values between 200 and 400 pg/mL may experience neuropsychiatric and hematologic abnormalities due to occult B12 deficiency; less than 1% of patients with values above 400 pg/mL will have symptoms. .    Folate 16.7 ng/mL    Comment:  Reference Range                            Low:           <3.4                            Borderline:    3.4-5.4                            Normal:        >5.4 .   Vitamin B1     Status: None   Collection Time: 07/04/23  9:39 AM  Result Value Ref Range   Vitamin B1 (Thiamine) 9 8 - 30 nmol/L    Comment: (Note) Vitamin supplementation within 24 hours prior to blood  draw may affect the accuracy of the results. . This test was developed and its analytical performance  characteristics have been determined by [employer]. It has not been cleared or approved by FDA.  This assay has been validated pursuant to the CLIA  regulations and is used for clinical purposes. . [facility] [address] [place] [address] [phone] Junita Push L. Thompson Caul, MD, PhD   B Nat Peptide     Status: None   Collection Time: 07/04/23  9:39 AM  Result Value Ref Range   Brain Natriuretic  Peptide 6 <100 pg/mL    Comment: . BNP levels increase with age in the general population with the highest values seen in individuals greater than 54 years of age. Reference: J. Am. Ladon Applebaum. Cardiol. 2002; 28:413-244. Marland Kitchen   PSA     Status: None   Collection Time: 07/04/23  9:39 AM  Result Value Ref Range   PSA 1.06 < OR = 4.00 ng/mL    Comment: The total PSA value from this assay system is  standardized against the WHO standard. The test  result will be approximately 20% lower when compared  to the equimolar-standardized total PSA (Beckman  Coulter). Comparison of serial PSA results should be  interpreted with this fact in mind. . This test was performed using the Siemens  chemiluminescent method. Values obtained from  different assay methods cannot be used interchangeably. PSA levels, regardless of value, should not be interpreted as absolute evidence of the presence or absence of disease.      Assessment & Plan:     1. Encounter for Medicare annual wellness exam ***  *** type dotphrase "dot"diagmed to refresh this list  Exercise Activities and Dietary recommendations  Goals   None    Discussed health benefits of physical activity, and encouraged him to engage in regular exercise appropriate for his age and condition.   Immunization History  Administered Date(s) Administered   Janssen (J&J) SARS-COV-2 Vaccination 06/05/2020   Tdap 11/09/2016    Health Maintenance  Topic Date Due   OPHTHALMOLOGY EXAM  Never done   Diabetic kidney evaluation - Urine ACR  Never done   Lung Cancer Screening  Never done   COVID-19 Vaccine (2 - Janssen risk series) 07/03/2020   Colonoscopy  08/24/2023   INFLUENZA VACCINE  08/15/2023 (Originally 07/13/2023)   Zoster Vaccines- Shingrix (1 of 2) 10/03/2023 (Originally 04/18/1977)   Pneumonia Vaccine 22+ Years old (1 of 2 - PCV) 07/02/2024 (Originally 04/18/1964)   HEMOGLOBIN A1C  01/04/2024   Diabetic kidney evaluation -  eGFR measurement   07/03/2024   FOOT EXAM  07/17/2024   Medicare Annual Wellness (AWV)  08/01/2024   DTaP/Tdap/Td (2 - Td or Tdap) 11/09/2026   Hepatitis C Screening  Completed   HIV Screening  Completed   HPV VACCINES  Aged Out     No orders of the defined types were placed in this encounter.   Current Outpatient Medications:    amLODipine-benazepril (LOTREL) 5-20 MG capsule, Take 1 capsule by mouth daily., Disp: 90 capsule, Rfl: 1   atorvastatin (LIPITOR) 40 MG tablet, Take 1 tablet (40 mg total) by mouth daily., Disp: 90 tablet, Rfl: 3   azelastine (ASTELIN) 0.1 % nasal spray, Place 1 spray into both nostrils 2 (two) times daily. Use in each nostril as directed, Disp: 90 mL, Rfl: 1   Fluticasone-Umeclidin-Vilant (TRELEGY ELLIPTA) 100-62.5-25 MCG/ACT AEPB, Inhale 1 puff into the lungs daily., Disp: 1 each, Rfl: 5   Ibuprofen 200 MG CAPS, Take by mouth., Disp: , Rfl:    loratadine (CLARITIN) 10 MG tablet, Take 1 tablet (10 mg total) by mouth 2 (two) times daily., Disp: 180 tablet, Rfl: 1   tiZANidine (ZANAFLEX) 4 MG tablet, Take 1 tablet (4 mg total) by mouth 3 (three) times daily., Disp: 270 tablet, Rfl: 1   Vitamin D, Ergocalciferol, (DRISDOL) 1.25 MG (50000 UNIT) CAPS capsule, TAKE 1 CAPSULE BY MOUTH EVERY 7  DAYS, Disp: 12 capsule, Rfl: 3   zolpidem (AMBIEN) 5 MG tablet, Take 1 tablet (5 mg total) by mouth at bedtime., Disp: 90 tablet, Rfl: 0 There are no discontinued medications.  I have personally reviewed and addressed the Medicare Annual Wellness health risk assessment questionnaire and have noted the following in the patient's chart:  A.         Medical and social history & family history B.         Use of alcohol, tobacco or illicit drugs  C.         Current medications and supplements D.         Functional and Cognitive ability and status E.         Nutritional status F.         Physical activity G.        Advance directives H.         List of other physicians I.          Hospitalizations,  surgeries, and ER visits in previous 12 months J.         Vitals K.         Screenings such as hearing and vision if needed, cognitive and depression L.         Referrals and appointments - ***  In addition, I have reviewed and discussed with patient certain preventive protocols, quality metrics, and best practice recommendations. A written personalized care plan for preventive services as well as general preventive health recommendations were provided to patient.   See attached scanned questionnaire for additional information.   No follow-ups on file.  *** refresh to show

## 2023-08-02 ENCOUNTER — Ambulatory Visit: Payer: Managed Care, Other (non HMO) | Admitting: Family Medicine

## 2023-08-02 DIAGNOSIS — Z0000 Encounter for general adult medical examination without abnormal findings: Secondary | ICD-10-CM

## 2023-08-02 DIAGNOSIS — Z1211 Encounter for screening for malignant neoplasm of colon: Secondary | ICD-10-CM

## 2023-08-23 ENCOUNTER — Encounter: Payer: Self-pay | Admitting: Gastroenterology

## 2023-08-24 ENCOUNTER — Ambulatory Visit
Admission: RE | Admit: 2023-08-24 | Discharge: 2023-08-24 | Disposition: A | Discharge status: Home / Self Care | Payer: Medicare HMO | Source: Ambulatory Visit | Attending: Gastroenterology | Admitting: Gastroenterology

## 2023-08-24 ENCOUNTER — Other Ambulatory Visit: Payer: Self-pay

## 2023-08-24 ENCOUNTER — Encounter: Payer: Self-pay | Admitting: Gastroenterology

## 2023-08-24 ENCOUNTER — Ambulatory Visit: Payer: Medicare HMO | Admitting: Certified Registered Nurse Anesthetist

## 2023-08-24 ENCOUNTER — Encounter
Admission: RE | Disposition: A | Discharge status: Home / Self Care | Payer: Self-pay | Source: Ambulatory Visit | Attending: Gastroenterology

## 2023-08-24 DIAGNOSIS — I10 Essential (primary) hypertension: Secondary | ICD-10-CM | POA: Insufficient documentation

## 2023-08-24 DIAGNOSIS — Z87891 Personal history of nicotine dependence: Secondary | ICD-10-CM | POA: Diagnosis not present

## 2023-08-24 DIAGNOSIS — D122 Benign neoplasm of ascending colon: Secondary | ICD-10-CM | POA: Insufficient documentation

## 2023-08-24 DIAGNOSIS — Z6836 Body mass index (BMI) 36.0-36.9, adult: Secondary | ICD-10-CM | POA: Insufficient documentation

## 2023-08-24 DIAGNOSIS — Z86010 Personal history of colonic polyps: Secondary | ICD-10-CM | POA: Diagnosis not present

## 2023-08-24 DIAGNOSIS — D649 Anemia, unspecified: Secondary | ICD-10-CM | POA: Diagnosis not present

## 2023-08-24 DIAGNOSIS — K635 Polyp of colon: Secondary | ICD-10-CM

## 2023-08-24 DIAGNOSIS — E119 Type 2 diabetes mellitus without complications: Secondary | ICD-10-CM | POA: Diagnosis not present

## 2023-08-24 DIAGNOSIS — F909 Attention-deficit hyperactivity disorder, unspecified type: Secondary | ICD-10-CM | POA: Insufficient documentation

## 2023-08-24 DIAGNOSIS — D125 Benign neoplasm of sigmoid colon: Secondary | ICD-10-CM | POA: Diagnosis not present

## 2023-08-24 DIAGNOSIS — K279 Peptic ulcer, site unspecified, unspecified as acute or chronic, without hemorrhage or perforation: Secondary | ICD-10-CM | POA: Diagnosis not present

## 2023-08-24 DIAGNOSIS — Z1211 Encounter for screening for malignant neoplasm of colon: Secondary | ICD-10-CM | POA: Insufficient documentation

## 2023-08-24 DIAGNOSIS — Z800 Family history of malignant neoplasm of digestive organs: Secondary | ICD-10-CM | POA: Diagnosis not present

## 2023-08-24 DIAGNOSIS — G8929 Other chronic pain: Secondary | ICD-10-CM | POA: Diagnosis not present

## 2023-08-24 DIAGNOSIS — M1990 Unspecified osteoarthritis, unspecified site: Secondary | ICD-10-CM | POA: Diagnosis not present

## 2023-08-24 HISTORY — PX: POLYPECTOMY: SHX5525

## 2023-08-24 HISTORY — PX: COLONOSCOPY WITH PROPOFOL: SHX5780

## 2023-08-24 SURGERY — COLONOSCOPY WITH PROPOFOL
Anesthesia: General

## 2023-08-24 MED ORDER — DEXMEDETOMIDINE HCL IN NACL 200 MCG/50ML IV SOLN
INTRAVENOUS | Status: DC | PRN
Start: 2023-08-24 — End: 2023-08-24
  Administered 2023-08-24: 8 ug via INTRAVENOUS
  Administered 2023-08-24: 4 ug via INTRAVENOUS

## 2023-08-24 MED ORDER — PROPOFOL 500 MG/50ML IV EMUL
INTRAVENOUS | Status: DC | PRN
  Administered 2023-08-24: 180 ug/kg/min via INTRAVENOUS

## 2023-08-24 MED ORDER — SODIUM CHLORIDE 0.9 % IV SOLN: INTRAVENOUS | Status: DC

## 2023-08-24 MED ORDER — PROPOFOL 10 MG/ML IV BOLUS
INTRAVENOUS | Status: DC | PRN
  Administered 2023-08-24: 70 mg via INTRAVENOUS

## 2023-08-24 MED ORDER — PROPOFOL 1000 MG/100ML IV EMUL
INTRAVENOUS | Status: AC
  Filled 2023-08-24: qty 100

## 2023-08-24 NOTE — Anesthesia Procedure Notes (Signed)
Date/Time: 08/24/2023 8:19 AM  Performed by: Malva Cogan, CRNAPre-anesthesia Checklist: Patient identified, Emergency Drugs available, Suction available, Patient being monitored and Timeout performed Patient Re-evaluated:Patient Re-evaluated prior to induction Oxygen Delivery Method: Nasal cannula Induction Type: IV induction Placement Confirmation: CO2 detector and positive ETCO2

## 2023-08-24 NOTE — Transfer of Care (Signed)
Immediate Anesthesia Transfer of Care Note  Patient: Carl Burke  Procedure(s) Performed: COLONOSCOPY WITH PROPOFOL POLYPECTOMY  Patient Location: PACU  Anesthesia Type:General  Level of Consciousness: sedated  Airway & Oxygen Therapy: Patient Spontanous Breathing  Post-op Assessment: Report given to RN and Post -op Vital signs reviewed and stable  Post vital signs: Reviewed and stable  Last Vitals:  Vitals Value Taken Time  BP    Temp    Pulse    Resp    SpO2      Last Pain:  Vitals:   08/24/23 0737  TempSrc: Temporal         Complications: No notable events documented.

## 2023-08-24 NOTE — Op Note (Signed)
[facility] Gastroenterology Patient Name: Carl Burke Procedure Date: 08/24/2023 8:12 AM MRN: [id number] Account #: [id number] Date of Birth: 1956-10-30 Admit Type: Outpatient Age: 67 Room: [facility] ENDO ROOM 2 Gender: Male Note Status: Finalized Instrument Name: Prentice Docker [id number] Procedure:             Colonoscopy Indications:           Surveillance: Personal history of adenomatous polyps                         on last colonoscopy 5 years ago, Last colonoscopy:                         September 2019 Providers:             Toney Reil MD, MD Referring MD:          Onnie Boer. Sowles, MD (Referring MD) Medicines:             General Anesthesia Complications:         No immediate complications. Estimated blood loss: None. Procedure:             Pre-Anesthesia Assessment:                        - Prior to the procedure, a History and Physical was                         performed, and patient medications and allergies were                         reviewed. The patient is competent. The risks and                         benefits of the procedure and the sedation options and                         risks were discussed with the patient. All questions                         were answered and informed consent was obtained.                         Patient identification and proposed procedure were                         verified by the physician, the nurse, the                         anesthesiologist, the anesthetist and the technician                         in the pre-procedure area in the procedure room in the                         endoscopy suite. Mental Status Examination: alert and                         oriented. Airway Examination: normal oropharyngeal  airway and neck mobility. Respiratory Examination:                         clear to auscultation. CV Examination: normal.                         Prophylactic Antibiotics: The  patient does not require                         prophylactic antibiotics. Prior Anticoagulants: The                         patient has taken no anticoagulant or antiplatelet                         agents. ASA Grade Assessment: III - A patient with                         severe systemic disease. After reviewing the risks and                         benefits, the patient was deemed in satisfactory                         condition to undergo the procedure. The anesthesia                         plan was to use general anesthesia. Immediately prior                         to administration of medications, the patient was                         re-assessed for adequacy to receive sedatives. The                         heart rate, respiratory rate, oxygen saturations,                         blood pressure, adequacy of pulmonary ventilation, and                         response to care were monitored throughout the                         procedure. The physical status of the patient was                         re-assessed after the procedure.                        After obtaining informed consent, the colonoscope was                         passed under direct vision. Throughout the procedure,                         the patient's blood pressure, pulse, and oxygen  saturations were monitored continuously. The                         Colonoscope was introduced through the anus and                         advanced to the the cecum, identified by appendiceal                         orifice and ileocecal valve. The colonoscopy was                         performed without difficulty. The patient tolerated                         the procedure well. The quality of the bowel                         preparation was evaluated using the BBPS [facility] Bowel                         Preparation Scale) with scores of: Right Colon = 3,                         Transverse Colon = 3  and Left Colon = 3 (entire mucosa                         seen well with no residual staining, small fragments                         of stool or opaque liquid). The total BBPS score                         equals 9. The ileocecal valve, appendiceal orifice,                         and rectum were photographed. Findings:      The perianal and digital rectal examinations were normal. Pertinent       negatives include normal sphincter tone and no palpable rectal lesions.      Two sessile polyps were found in the sigmoid colon and ascending colon.       The polyps were 5 to 6 mm in size. These polyps were removed with a cold       snare. Resection and retrieval were complete. Estimated blood loss: none.      The retroflexed view of the distal rectum and anal verge was normal and       showed no anal or rectal abnormalities. Impression:            - Two 5 to 6 mm polyps in the sigmoid colon and in the                         ascending colon, removed with a cold snare. Resected                         and retrieved.                        -  The distal rectum and anal verge are normal on                         retroflexion view. Recommendation:        - Discharge patient to home (with escort).                        - Resume previous diet daily.                        - Await pathology results.                        - Continue present medications.                        - Repeat colonoscopy in 5 years for surveillance. Procedure Code(s):     --- Professional ---                        [id number], Colonoscopy, flexible; with removal of                         tumor(s), polyp(s), or other lesion(s) by snare                         technique Diagnosis Code(s):     --- Professional ---                        Z86.010, Personal history of colonic polyps                        D12.5, Benign neoplasm of sigmoid colon                        D12.2, Benign neoplasm of ascending colon CPT copyright 2022  American Medical Association. All rights reserved. The codes documented in this report are preliminary and upon coder review may  be revised to meet current compliance requirements. Dr. Libby Maw Toney Reil MD, MD 08/24/2023 8:39:16 AM This report has been signed electronically. Number of Addenda: 0 Note Initiated On: 08/24/2023 8:12 AM Scope Withdrawal Time: 0 hours 10 minutes 12 seconds  Total Procedure Duration: 0 hours 12 minutes 59 seconds  Estimated Blood Loss:  Estimated blood loss: none.      [facility]

## 2023-08-24 NOTE — Anesthesia Postprocedure Evaluation (Signed)
Anesthesia Post Note  Patient: Delshon Kanhai  Procedure(s) Performed: COLONOSCOPY WITH PROPOFOL POLYPECTOMY  Patient location during evaluation: PACU Anesthesia Type: General Level of consciousness: awake and alert, oriented and patient cooperative Pain management: pain level controlled Vital Signs Assessment: post-procedure vital signs reviewed and stable Respiratory status: spontaneous breathing, nonlabored ventilation and respiratory function stable Cardiovascular status: blood pressure returned to baseline and stable Postop Assessment: adequate PO intake Anesthetic complications: no   No notable events documented.   Last Vitals:  Vitals:   08/24/23 0852 08/24/23 0902  BP: (!) 141/75 137/71  Pulse: (!) 56 (!) 58  Resp: 18 18  Temp:    SpO2: 92% 94%    Last Pain:  Vitals:   08/24/23 0902  TempSrc:   PainSc: 0-No pain                 Reed Breech

## 2023-08-24 NOTE — Anesthesia Preprocedure Evaluation (Addendum)
Anesthesia Evaluation  Patient identified by MRN, date of birth, ID band Patient awake    Reviewed: Allergy & Precautions, NPO status , Patient's Chart, lab work & pertinent test results  History of Anesthesia Complications Negative for: history of anesthetic complications  Airway Mallampati: III   Neck ROM: Full    Dental  (+) Missing   Pulmonary former smoker (quit 03/2022)   Pulmonary exam normal breath sounds clear to auscultation       Cardiovascular hypertension, Normal cardiovascular exam Rhythm:Regular Rate:Normal     Neuro/Psych  PSYCHIATRIC DISORDERS (ADHD)      Chronic pain; alcohol use disorder, 1 shot of homemade alcohol daily CVA (age 67; no residual deficits)    GI/Hepatic PUD,,,  Endo/Other  diabetes, Type 2  Class 3 obesity  Renal/GU negative Renal ROS     Musculoskeletal  (+) Arthritis ,    Abdominal   Peds  Hematology  (+) Blood dyscrasia, anemia   Anesthesia Other Findings   Reproductive/Obstetrics                             Anesthesia Physical Anesthesia Plan  ASA: 3  Anesthesia Plan: General   Post-op Pain Management:    Induction: Intravenous  PONV Risk Score and Plan: 2 and Propofol infusion, TIVA and Treatment may vary due to age or medical condition  Airway Management Planned: Natural Airway  Additional Equipment:   Intra-op Plan:   Post-operative Plan:   Informed Consent: I have reviewed the patients History and Physical, chart, labs and discussed the procedure including the risks, benefits and alternatives for the proposed anesthesia with the patient or authorized representative who has indicated his/her understanding and acceptance.   Patient has DNR.  Discussed DNR with patient and Suspend DNR.     Plan Discussed with: CRNA  Anesthesia Plan Comments: (LMA/GETA backup discussed.  Patient consented for risks of anesthesia including but not  limited to:  - adverse reactions to medications - damage to eyes, teeth, lips or other oral mucosa - nerve damage due to positioning  - sore throat or hoarseness - damage to heart, brain, nerves, lungs, other parts of body or loss of life  Informed patient about role of CRNA in peri- and intra-operative care.  Patient voiced understanding.)        Anesthesia Quick Evaluation

## 2023-08-24 NOTE — H&P (Signed)
Arlyss Repress, MD [address]  [address]  [place], [place] [address]  Main: [phone]  Fax: [phone] Pager: [phone]  Primary Care Physician:  Alba Cory, MD Primary Gastroenterologist:  Dr. Arlyss Repress  Pre-Procedure History & Physical: HPI:  Carl Burke is a 67 y.o. male is here for an colonoscopy.   Past Medical History:  Diagnosis Date   Acute blood loss anemia 07/17/2016   Acute duodenal ulcer with bleeding 07/17/2016   Arthritis    Colon cancer screening    Diabetes mellitus type 2, diet-controlled (HCC) 07/18/2023   Duodenal ulcer due to bacteria    Helicobacter pylori gastritis    Helicobacter pylori gastritis    Rx 1) PPI pantoprazole 40 mg qd x 2 mos 2) Pepto Bismol 2 tabs (262 mg each) 4 times a day x 14 d 3) Metronidazole 250 mg 4 times a day x 14 d 4) doxycycline 100 mg 2 times a day x 14 d   In 4 weeks after treatment completed do H. Pylori stool antigen - dx H. Pylori gastritis    Helicobacter pylori infection    Hyperlipidemia    Hypertension    Long term prescription opiate use 04/12/2016   Lumbosacral radiculopathy at S1 (Left) 04/12/2016   Opiate use 04/12/2016   Opiate use (300 MME/Day) 04/12/2016   Fentanyl patch 100 g per hour every 72 hours + oxycodone/APAP 10/325 one every 6 hours (40 mg/day).    Seasonal affective disorder (HCC) 01/19/2021    Past Surgical History:  Procedure Laterality Date   COLONOSCOPY     COLONOSCOPY WITH PROPOFOL N/A 08/23/2018   Procedure: COLONOSCOPY WITH PROPOFOL;  Surgeon: Toney Reil, MD;  Location: [facility] ENDOSCOPY;  Service: Gastroenterology;  Laterality: N/A;   ESOPHAGOGASTRODUODENOSCOPY N/A 07/19/2016   Procedure: ESOPHAGOGASTRODUODENOSCOPY (EGD);  Surgeon: Iva Boop, MD;  Location: [facility] ENDOSCOPY;  Service: Endoscopy;  Laterality: N/A;   ESOPHAGOGASTRODUODENOSCOPY (EGD) WITH PROPOFOL N/A 08/23/2018   Procedure: ESOPHAGOGASTRODUODENOSCOPY (EGD) WITH PROPOFOL;  Surgeon: Toney Reil, MD;   Location: [facility] ENDOSCOPY;  Service: Gastroenterology;  Laterality: N/A;   ROTATOR CUFF REPAIR Left 2011   ROTATOR CUFF REPAIR Right 2013    Prior to Admission medications   Medication Sig Start Date End Date Taking? Authorizing Provider  amLODipine-benazepril (LOTREL) 5-20 MG capsule Take 1 capsule by mouth daily. 07/18/23  Yes Sowles, Danna Hefty, MD  atorvastatin (LIPITOR) 40 MG tablet Take 1 tablet (40 mg total) by mouth daily. 01/11/23  Yes Sowles, Danna Hefty, MD  azelastine (ASTELIN) 0.1 % nasal spray Place 1 spray into both nostrils 2 (two) times daily. Use in each nostril as directed 10/11/22   Alba Cory, MD  Fluticasone-Umeclidin-Vilant (TRELEGY ELLIPTA) 100-62.5-25 MCG/ACT AEPB Inhale 1 puff into the lungs daily. 07/18/23   Alba Cory, MD  Ibuprofen 200 MG CAPS Take by mouth.    [provider]  loratadine (CLARITIN) 10 MG tablet Take 1 tablet (10 mg total) by mouth 2 (two) times daily. 10/11/22   Alba Cory, MD  tiZANidine (ZANAFLEX) 4 MG tablet Take 1 tablet (4 mg total) by mouth 3 (three) times daily. 07/18/23   Alba Cory, MD  Vitamin D, Ergocalciferol, (DRISDOL) 1.25 MG (50000 UNIT) CAPS capsule TAKE 1 CAPSULE BY MOUTH EVERY 7  DAYS 12/26/22   Alba Cory, MD  zolpidem (AMBIEN) 5 MG tablet Take 1 tablet (5 mg total) by mouth at bedtime. 07/19/23   Alba Cory, MD    Allergies as of 07/05/2023 - Review Complete 07/04/2023  Allergen Reaction Noted   Naproxen sodium Rash and Other (See Comments) 04/12/2016    Family History  Problem Relation Age of Onset   Heart disease Mother    Colon cancer Father    Cancer Sister    Breast cancer Sister 50   Cancer Sister     Social History   Socioeconomic History   Marital status: Married    Spouse name: Alona Bene   Number of children: 3   Years of education: Not on file   Highest education level: Not on file  Occupational History   Occupation: retired   Tobacco Use   Smoking status: Former    Current  packs/day: 0.00    Average packs/day: 0.5 packs/day for 51.4 years (25.7 ttl pk-yrs)    Types: Cigarettes    Start date: 09/30/1970    Quit date: 03/12/2022    Years since quitting: 1.4   Smokeless tobacco: Never  Vaping Use   Vaping status: Never Used  Substance and Sexual Activity   Alcohol use: Yes    Alcohol/week: 0.0 standard drinks of alcohol    Comment: Rare/occassional    Drug use: No   Sexual activity: Yes    Partners: Female  Other Topics Concern   Not on file  Social History Narrative   He retired, 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 but does not drink, just to taste it   He used to be very active, boxer   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  (07/04/2023)   Overall Financial Resource Strain (CARDIA)    Difficulty of Paying Living Expenses: Not hard at all  Food Insecurity: No Food Insecurity (07/04/2023)   Hunger Vital Sign    Worried About Running Out of Food in the Last Year: Never true    Ran Out of Food in the Last Year: Never true  Transportation Needs: No Transportation Needs (07/04/2023)   PRAPARE - Administrator, Civil Service (Medical): No    Lack of Transportation (Non-Medical): No  Physical Activity: Inactive (07/04/2023)   Exercise Vital Sign    Days of Exercise per Week: 0 days    Minutes of Exercise per Session: 0 min  Stress: Stress Concern Present (07/04/2023)   [employer] of Occupational Health - Occupational Stress Questionnaire    Feeling of Stress : To some extent  Social Connections: Moderately Isolated (07/04/2023)   Social Connection and Isolation Panel [NHANES]    Frequency of Communication with Friends and Family: More than three times a week    Frequency of Social Gatherings with Friends and Family: Never    Attends Religious Services: Never    Database administrator or Organizations: No    Attends Banker Meetings: Never    Marital Status: Married  Catering manager  Violence: Not At Risk (07/04/2023)   Humiliation, Afraid, Rape, and Kick questionnaire    Fear of Current or Ex-Partner: No    Emotionally Abused: No    Physically Abused: No    Sexually Abused: No    Review of Systems: See HPI, otherwise negative ROS  Physical Exam: BP (!) 152/81   Pulse (!) 59   Temp (!) 96.5 F (35.8 C) (Temporal)   Resp 18   Ht 6' (1.829 m)   Wt 121.3 kg   SpO2 99%   BMI 36.28 kg/m  General:   Alert,  pleasant and cooperative in NAD Head:  Normocephalic and atraumatic. Neck:  Supple;  no masses or thyromegaly. Lungs:  Clear throughout to auscultation.    Heart:  Regular rate and rhythm. Abdomen:  Soft, nontender and nondistended. Normal bowel sounds, without guarding, and without rebound.   Neurologic:  Alert and  oriented x4;  grossly normal neurologically.  Impression/Plan: Carl Burke is here for an colonoscopy to be performed for h/o colon adenomas  Risks, benefits, limitations, and alternatives regarding  colonoscopy have been reviewed with the patient.  Questions have been answered.  All parties agreeable.   Lannette Donath, MD  08/24/2023, 7:41 AM

## 2023-08-25 ENCOUNTER — Encounter: Payer: Self-pay | Admitting: Gastroenterology

## 2023-08-25 LAB — SURGICAL PATHOLOGY

## 2023-09-11 ENCOUNTER — Telehealth: Payer: Self-pay | Admitting: Family Medicine

## 2023-09-11 NOTE — Telephone Encounter (Signed)
Copied from CRM [id number]. Topic: General - Other >> Sep 11, 2023  9:00 AM Phill Myron wrote: Reason for CRM:  please expect a fax for [facility] w/[facility]. Requesting prescriptions

## 2023-09-13 ENCOUNTER — Encounter: Payer: Self-pay | Admitting: Emergency Medicine

## 2023-09-14 ENCOUNTER — Other Ambulatory Visit: Payer: Self-pay | Admitting: Family Medicine

## 2023-09-14 NOTE — Telephone Encounter (Signed)
Medication Refill - Medication: zolpidem (AMBIEN) 5 MG tablet   Has the patient contacted their pharmacy? Yes.   (Agent: If no, request that the patient contact the pharmacy for the refill. If patient does not wish to contact the pharmacy document the reason why and proceed with request.) (Agent: If yes, when and what did the pharmacy advise?)  Preferred Pharmacy (with phone number or street name):  [facility] - [place], [place] - [address] Phone: [phone]  Fax: [phone]     Has the patient been seen for an appointment in the last year OR does the patient have an upcoming appointment? Yes.    Agent: Please be advised that RX refills may take up to 3 business days. We ask that you follow-up with your pharmacy.

## 2023-09-15 ENCOUNTER — Other Ambulatory Visit: Payer: Self-pay

## 2023-09-15 NOTE — Telephone Encounter (Signed)
Requested medication (s) are due for refill today: yes  Requested medication (s) are on the active medication list: yes  Last refill:  07/19/23  Future visit scheduled: yes  Notes to clinic:  Unable to refill per protocol, cannot delegate.      Requested Prescriptions  Pending Prescriptions Disp Refills   zolpidem (AMBIEN) 5 MG tablet 90 tablet 0    Sig: Take 1 tablet (5 mg total) by mouth at bedtime.     Not Delegated - Psychiatry:  Anxiolytics/Hypnotics Failed - 09/15/2023  8:48 AM      Failed - This refill cannot be delegated      Failed - Urine Drug Screen completed in last 360 days      Passed - Valid encounter within last 6 months    Recent Outpatient Visits           1 month ago Morbid obesity [facility])   [place] [facility] Alba Cory, MD   2 months ago Well adult exam   [facility] Alba Cory, MD   8 months ago Simple chronic bronchitis [facility])   [place] [facility] Alba Cory, MD   11 months ago Simple chronic bronchitis [facility])   [place] [facility] Alba Cory, MD   1 year ago Swelling of lower extremity   [facility] Margarita Mail, DO       Future Appointments             In 2 months Carlynn Purl, Danna Hefty, MD [facility], [facility]

## 2023-09-19 ENCOUNTER — Other Ambulatory Visit: Payer: Self-pay

## 2023-09-19 DIAGNOSIS — G4700 Insomnia, unspecified: Secondary | ICD-10-CM

## 2023-11-13 NOTE — Progress Notes (Unsigned)
Name: Carl Burke   MRN: [id number]    DOB: [birth year]   Date:11/13/2023       Progress Note  Subjective  Chief Complaint  No chief complaint on file.   HPI  *** Patient Active Problem List   Diagnosis Date Noted   History of colonic polyps 08/24/2023   Family history of colon cancer in father 08/24/2023   Polyp of ascending colon 08/24/2023   Polyp of sigmoid colon 08/24/2023   Diabetes mellitus type 2, diet-controlled (HCC) 07/18/2023   B12 deficiency 07/18/2023   Insomnia 07/18/2023   Vitamin D deficiency 07/18/2023   Chronic pain syndrome 07/18/2023   Simple chronic bronchitis (HCC) 01/11/2023   Morbid obesity (HCC) 01/11/2023   Senile purpura (HCC) 04/04/2022   History of alcoholism (HCC) 04/04/2022   Seasonal affective disorder (HCC) 01/19/2021   Dyslipidemia 10/31/2019   Umbilical hernia without obstruction and without gangrene 07/17/2019   Pre-diabetes 01/07/2019   Degeneration of lumbosacral intervertebral disc 03/28/2018   Full thickness rotator cuff tear 03/28/2018   Neck pain 03/28/2018   Hyperlipidemia 12/20/2016   Leukocytosis 07/17/2016   Chronic shoulder pain (Right) 04/12/2016   Cervical spondylosis 04/12/2016   Chronic upper back pain (Location of Secondary source of pain) (Bilateral) (midline) 04/12/2016   Essential hypertension 04/12/2016   Lumbosacral radiculopathy at S1 (Left) 04/12/2016   Rotator cuff arthropathy of right shoulder 08/15/2013    Past Surgical History:  Procedure Laterality Date   COLONOSCOPY     COLONOSCOPY WITH PROPOFOL N/A 08/23/2018   Procedure: COLONOSCOPY WITH PROPOFOL;  Surgeon: Toney Reil, MD;  Location: [facility] ENDOSCOPY;  Service: Gastroenterology;  Laterality: N/A;   COLONOSCOPY WITH PROPOFOL N/A 08/24/2023   Procedure: COLONOSCOPY WITH PROPOFOL;  Surgeon: Toney Reil, MD;  Location: [facility] ENDOSCOPY;  Service: Gastroenterology;  Laterality: N/A;   ESOPHAGOGASTRODUODENOSCOPY N/A 07/19/2016   Procedure:  ESOPHAGOGASTRODUODENOSCOPY (EGD);  Surgeon: Iva Boop, MD;  Location: [facility] ENDOSCOPY;  Service: Endoscopy;  Laterality: N/A;   ESOPHAGOGASTRODUODENOSCOPY (EGD) WITH PROPOFOL N/A 08/23/2018   Procedure: ESOPHAGOGASTRODUODENOSCOPY (EGD) WITH PROPOFOL;  Surgeon: Toney Reil, MD;  Location: [facility] ENDOSCOPY;  Service: Gastroenterology;  Laterality: N/A;   POLYPECTOMY  08/24/2023   Procedure: POLYPECTOMY;  Surgeon: Toney Reil, MD;  Location: [facility] ENDOSCOPY;  Service: Gastroenterology;;   ROTATOR CUFF REPAIR Left 2011   ROTATOR CUFF REPAIR Right 2013    Family History  Problem Relation Age of Onset   Heart disease Mother    Colon cancer Father    Cancer Sister    Breast cancer Sister 73   Cancer Sister     Social History   Tobacco Use   Smoking status: Former    Current packs/day: 0.00    Average packs/day: 0.5 packs/day for 51.4 years (25.7 ttl pk-yrs)    Types: Cigarettes    Start date: 09/30/1970    Quit date: 03/12/2022    Years since quitting: 1.6   Smokeless tobacco: Never  Substance Use Topics   Alcohol use: Yes    Alcohol/week: 0.0 standard drinks of alcohol    Comment: Rare/occassional      Current Outpatient Medications:    amLODipine-benazepril (LOTREL) 5-20 MG capsule, Take 1 capsule by mouth daily., Disp: 90 capsule, Rfl: 1   atorvastatin (LIPITOR) 40 MG tablet, Take 1 tablet (40 mg total) by mouth daily., Disp: 90 tablet, Rfl: 3   azelastine (ASTELIN) 0.1 % nasal spray, Place 1 spray into both nostrils 2 (two) times daily. Use in each nostril as  directed, Disp: 90 mL, Rfl: 1   Fluticasone-Umeclidin-Vilant (TRELEGY ELLIPTA) 100-62.5-25 MCG/ACT AEPB, Inhale 1 puff into the lungs daily., Disp: 1 each, Rfl: 5   Ibuprofen 200 MG CAPS, Take by mouth., Disp: , Rfl:    loratadine (CLARITIN) 10 MG tablet, Take 1 tablet (10 mg total) by mouth 2 (two) times daily., Disp: 180 tablet, Rfl: 1   tiZANidine (ZANAFLEX) 4 MG tablet, Take 1 tablet (4 mg total) by mouth 3  (three) times daily., Disp: 270 tablet, Rfl: 1   Vitamin D, Ergocalciferol, (DRISDOL) 1.25 MG (50000 UNIT) CAPS capsule, TAKE 1 CAPSULE BY MOUTH EVERY 7  DAYS, Disp: 12 capsule, Rfl: 3   zolpidem (AMBIEN) 5 MG tablet, Take 1 tablet (5 mg total) by mouth at bedtime., Disp: 90 tablet, Rfl: 0  Allergies  Allergen Reactions   Naproxen Sodium Rash and Other (See Comments)    800 mg tablets that the MD prescribed for pain.    I personally reviewed {Reviewed:14835} with the patient/caregiver today.   ROS  ***  Objective  There were no vitals filed for this visit.  There is no height or weight on file to calculate BMI.  Physical Exam ***  Recent Results (from the past 2160 hour(s))  Surgical pathology     Status: None   Collection Time: 08/24/23 12:00 AM  Result Value Ref Range   SURGICAL PATHOLOGY      SURGICAL PATHOLOGY [place] [address], [address] [place], [place] [address] Telephone [phone] or [phone] Fax [phone]  REPORT OF SURGICAL PATHOLOGY   Accession #: [id number] Patient Name: WILLLIAM, DELFAVERO Visit # : [id number]  MRN: [id number] Physician: Lannette Donath DOB/Age 12-06-1956 (Age: 67) Gender: M Collected Date: 08/24/2023 Received Date: 08/24/2023  FINAL DIAGNOSIS       1. Ascending  Colon Polyp, Cold snare :       - TUBULAR ADENOMA.      - NEGATIVE FOR HIGH-GRADE DYSPLASIA AND MALIGNANCY.       2. Sigmoid  Colon Polyp, Cold snare :       - TUBULAR ADENOMA.      - NEGATIVE FOR HIGH-GRADE DYSPLASIA AND MALIGNANCY.       ELECTRONIC SIGNATURE : Rubinas Md, Delice Bison , Sports administrator, Electronic Signature  MICROSCOPIC DESCRIPTION  CASE COMMENTS STAINS USED IN DIAGNOSIS: H&E H&E    CLINICAL HISTORY  SPECIMEN(S) OBTAINED 1. Ascending  Colon Polyp, Cold Snare 2. Sigmoid  Colon Polyp, Cold Snare  SPEC IMEN COMMENTS: SPECIMEN CLINICAL INFORMATION: 1. Personal history of colon polyps.Family of colon cancer  (father).Polyps    Gross Description 1. "Cold snare and ascending colon polyp", received in formalin is a 0.5 x 0.5 x 0.1 cm aggregate of multiple tan-white tissue fragment.The specimen is filtered and submitted in toto in 1 block (1A). 2. "Cold snare sigmoid colon polyp", received in formalin is a 1.2 x 0.6 x 0.1 cm aggregate of 5 white-tan tissue fragments.The specimen is filtered and submitted in toto in 1 block (2A).      AMG 08/24/2023        Report signed out from the following location(s) [place] [address], [place] [address] CLIA #: [id number]  [facility] [address] [place], [place] [address] CLIA #: [id number]     Diabetic Foot Exam: Diabetic Foot Exam - Simple   No data filed    ***  PHQ2/9:    07/18/2023    9:16 AM  07/04/2023    8:17 AM 01/11/2023    8:17 AM 10/11/2022    8:27 AM 08/23/2022    1:57 PM  Depression screen PHQ 2/9  Decreased Interest 0 0 1 0 0  Down, Depressed, Hopeless 0 0 1 0 0  PHQ - 2 Score 0 0 2 0 0  Altered sleeping 0 0 0 0 0  Tired, decreased energy 0 0 0 0 0  Change in appetite 0 0 0 0 0  Feeling bad or failure about yourself  0 0 0 0 0  Trouble concentrating 0 0 0 0 0  Moving slowly or fidgety/restless 0 0 0 0 0  Suicidal thoughts 0 0 0 0 0  PHQ-9 Score 0 0 2 0 0  Difficult doing work/chores     Not difficult at all    phq 9 is {gen pos [id number]} ***  Fall Risk:    07/18/2023    9:16 AM 07/04/2023    8:16 AM 01/11/2023    8:17 AM 10/11/2022    8:27 AM 08/23/2022    1:57 PM  Fall Risk   Falls in the past year? 0 0 0 1 1  Number falls in past yr:   0 1 0  Injury with Fall?   0 0 1  Risk for fall due to : No Fall Risks No Fall Risks No Fall Risks History of fall(s)   Follow up Falls prevention discussed Falls prevention discussed;Education provided;Falls evaluation completed Falls prevention discussed Falls prevention discussed;Education provided;Falls evaluation  completed    ***   Functional Status Survey:   ***   Assessment & Plan  *** There are no diagnoses linked to this encounter.

## 2023-11-21 ENCOUNTER — Ambulatory Visit: Payer: Medicare HMO | Admitting: Family Medicine

## 2023-11-21 DIAGNOSIS — E119 Type 2 diabetes mellitus without complications: Secondary | ICD-10-CM

## 2023-11-21 DIAGNOSIS — Z23 Encounter for immunization: Secondary | ICD-10-CM

## 2023-11-27 ENCOUNTER — Other Ambulatory Visit: Payer: Self-pay | Admitting: Family Medicine

## 2023-11-27 DIAGNOSIS — G894 Chronic pain syndrome: Secondary | ICD-10-CM

## 2023-12-08 ENCOUNTER — Other Ambulatory Visit: Payer: Self-pay | Admitting: Family Medicine

## 2023-12-08 NOTE — Telephone Encounter (Signed)
Medication Refill -  Most Recent Primary Care Visit:  Provider: Alba Cory  Department: [facility]  Visit Type: OFFICE VISIT  Date: 07/18/2023  Medication: zolpidem (AMBIEN) 5 MG tablet   Has the patient contacted their pharmacy? Yes This is Carl [facility] calling for the pt.  I did advise her that this Rx will mostlikely be denied due pt not having an appt in almost a year. Is this the correct pharmacy for this prescription? Yes If no, delete pharmacy and type the correct one.  This is the patient's preferred pharmacy:   [facility] - [place], [place] - [address] [place] [address] Phone: [phone] Fax: [phone]   Has the prescription been filled recently? No  Is the patient out of the medication? Yes  Has the patient been seen for an appointment in the last year OR does the patient have an upcoming appointment? Yes  Can we respond through MyChart? No  Agent: Please be advised that Rx refills may take up to 3 business days. We ask that you follow-up with your pharmacy.

## 2023-12-13 NOTE — Telephone Encounter (Signed)
 Requested medication (s) are due for refill today:yes  Requested medication (s) are on the active medication list: yes  Last refill:  07/19/23 #90  Future visit scheduled: no  Notes to clinic:  med not delegated to NT to RF   Requested Prescriptions  Pending Prescriptions Disp Refills   zolpidem  (AMBIEN ) 5 MG tablet 90 tablet 0    Sig: Take 1 tablet (5 mg total) by mouth at bedtime.     Not Delegated - Psychiatry:  Anxiolytics/Hypnotics Failed - 12/13/2023 11:50 AM      Failed - This refill cannot be delegated      Failed - Urine Drug Screen completed in last 360 days      Passed - Valid encounter within last 6 months    Recent Outpatient Visits           4 months ago Morbid obesity [facility])   [facility] Glenard Mire, [facility]   5 months ago Well adult exam   [facility] Glenard Mire, MD   11 months ago Simple chronic bronchitis [facility])   [facility] Sowles, Krichna, MD   1 year ago Simple chronic bronchitis [facility])   [facility] Sowles, Krichna, MD   1 year ago Swelling of lower extremity   [facility] Bernardo Fend, [place]

## 2023-12-18 ENCOUNTER — Ambulatory Visit: Payer: Self-pay

## 2023-12-18 ENCOUNTER — Telehealth: Payer: Self-pay | Admitting: Family Medicine

## 2023-12-18 ENCOUNTER — Other Ambulatory Visit: Payer: Self-pay | Admitting: Family Medicine

## 2023-12-18 DIAGNOSIS — E782 Mixed hyperlipidemia: Secondary | ICD-10-CM

## 2023-12-18 DIAGNOSIS — G894 Chronic pain syndrome: Secondary | ICD-10-CM

## 2023-12-18 NOTE — Telephone Encounter (Signed)
 Medication Refill -  Most Recent Primary Care Visit:  Provider: GLENARD MIRE  Department: [facility]  Visit Type: OFFICE VISIT  Date: 07/18/2023  Medication:  atorvastatin  (LIPITOR) 40 MG tablet,  tiZANidine  (ZANAFLEX ) 4 MG tablet and amLODipine -benazepril  (LOTREL) 5-20 MG capsule    Has the patient contacted their pharmacy? No  Is this the correct pharmacy for this prescription? Yes If no, delete pharmacy and type the correct one.  This is the patient's preferred pharmacy:   [facility] - [place], [place] - [address] [place] [address] Phone: [phone] Fax: [phone]   Has the prescription been filled recently? Yes  Is the patient out of the medication? Yes  Has the patient been seen for an appointment in the last year OR does the patient have an upcoming appointment? Yes  Can we respond through MyChart? No  Agent: Please be advised that Rx refills may take up to 3 business days. We ask that you follow-up with your pharmacy.

## 2023-12-18 NOTE — Telephone Encounter (Signed)
 ERROR

## 2023-12-18 NOTE — Telephone Encounter (Signed)
 Summary: medication increase   Patient stated the zolpidem (AMBIEN) 5 MG tablet is not strong enough and request to have it increased to 10mg 

## 2023-12-18 NOTE — Telephone Encounter (Signed)
 Pt called, unable to LVM d/t line kept ringing. VM not available.   Summary: medication increase   Patient stated the zolpidem (AMBIEN) 5 MG tablet is not strong enough and request to have it increased to 10mg 

## 2023-12-18 NOTE — Telephone Encounter (Signed)
  Chief Complaint: medication problem Symptoms: difficulty sleeping Frequency: several weeks  Pertinent Negatives: NA Disposition: [] ED /[] Urgent Care (no appt availability in office) / [] Appointment(In office/virtual)/ []  [facility]/ [] Home Care/ [] Refused Recommended Disposition /[] [facility]/ [x]  Follow-up with PCP Additional Notes: pt states that Ambien  5 MG isn't helping. He was previously taking 12.5 MG but was too much so stopped and decreased to 6.25 MG but insurance wont approve anymore. Pt was replaced on 5 MG and not enough. He is asking if 10 MG could be sent in and see how he does on that. Advised pt I would send to PCP for review.   Summary: medication increase   Patient stated the zolpidem  (AMBIEN ) 5 MG tablet is not strong enough and request to have it increased to 10mg      Reason for Disposition  [1] Caller has URGENT medicine question about med that PCP or specialist prescribed AND [2] triager unable to answer question  Answer Assessment - Initial Assessment Questions 1. NAME of MEDICINE: What medicine(s) are you calling about?     Ambien  5 MG  2. QUESTION: What is your question? (e.g., double dose of medicine, side effect)     5 MG not helping, was taking 6.25mg  but insurance won't cover anymore only 5 MG or 10 MG  3. PRESCRIBER: Who prescribed the medicine? Reason: if prescribed by specialist, call should be referred to that group.     Dr. Glenard 4. SYMPTOMS: Do you have any symptoms? If Yes, ask: What symptoms are you having?  How bad are the symptoms (e.g., mild, moderate, severe)     Difficulty sleeping  Protocols used: Medication Question Call-A-AH

## 2023-12-19 NOTE — Telephone Encounter (Signed)
 Asking for increase in his medication

## 2023-12-19 NOTE — Telephone Encounter (Signed)
 Pt needs to make an appt to discuss medication dose change. Last time seen in August

## 2023-12-19 NOTE — Telephone Encounter (Signed)
 Pt appt made for medication change

## 2023-12-21 MED ORDER — ATORVASTATIN CALCIUM 40 MG PO TABS
40.0000 mg | ORAL_TABLET | Freq: Every day | ORAL | 1 refills | Status: DC
Start: 2023-12-21 — End: 2023-12-25

## 2023-12-21 NOTE — Telephone Encounter (Signed)
 Requested medication (s) are due for refill today:   Yes,   Provider to review Zanaflex   Requested medication (s) are on the active medication list:   Yes for all 3  Future visit scheduled:   Yes in 4 days 12/25/2023   Last ordered: Zanaflex  07/18/2023 #270, 1 refill;   Atorvastatin  01/11/2023 #90, 3 refills;   Lotrel 07/18/2023 #90, 1 refill - no protocol assigned to this med.  Unable to refill because non delegated and no protocol assigned to Lotrel    Requested Prescriptions  Pending Prescriptions Disp Refills   tiZANidine  (ZANAFLEX ) 4 MG tablet 270 tablet 1    Sig: Take 1 tablet (4 mg total) by mouth 3 (three) times daily.     Not Delegated - Cardiovascular:  Alpha-2 Agonists - tizanidine  Failed - 12/21/2023  7:42 AM      Failed - This refill cannot be delegated      Passed - Valid encounter within last 6 months    Recent Outpatient Visits           5 months ago Morbid obesity [facility])   [facility] Glenard Mire, MD   5 months ago Well adult exam   [facility] Glenard Mire, MD   11 months ago Simple chronic bronchitis [facility])   [place] [facility] Glenard Mire, MD   1 year ago Simple chronic bronchitis [facility])   [facility] Glenard Mire, MD   1 year ago Swelling of lower extremity   [facility] Bernardo Fend, DO       Future Appointments             In 4 days Sowles, Krichna, MD [facility]             atorvastatin  (LIPITOR) 40 MG tablet 90 tablet 3    Sig: Take 1 tablet (40 mg total) by mouth daily.     Cardiovascular:  Antilipid - Statins Failed - 12/21/2023  7:42 AM      Failed - Lipid Panel in normal range within the last 12 months    Cholesterol, Total  Date Value Ref Range Status  07/25/2022 175 100 - 199 mg/dL Final   Cholesterol  Date Value Ref Range Status  07/04/2023 154 <200 mg/dL Final    LDL Cholesterol (Calc)  Date Value Ref Range Status  07/04/2023 91 mg/dL (calc) Final    Comment:    Reference range: <100 . Desirable range <100 mg/dL for primary prevention;   <70 mg/dL for patients with CHD or diabetic patients  with > or = 2 CHD risk factors. SABRA LDL-C is now calculated using the Martin-Hopkins  calculation, which is a validated novel method providing  better accuracy than the Friedewald equation in the  estimation of LDL-C.  Gladis APPLETHWAITE et al. SANDREA. 7986;689(80): 2061-2068  (http://education.Quest[employer].com/faq/FAQ164)    HDL  Date Value Ref Range Status  07/04/2023 41 > OR = 40 mg/dL Final  91/85/7976 36 (L) >39 mg/dL Final   Triglycerides  Date Value Ref Range Status  07/04/2023 123 <150 mg/dL Final         Passed - Patient is not pregnant      Passed - Valid encounter within last 12 months    Recent Outpatient Visits           5 months ago Morbid obesity [facility])   [facility] [place], Krichna,  MD   5 months ago Well adult exam   [facility] Glenard Mire, MD   11 months ago Simple chronic bronchitis [facility])   [place] Glenard Mire, MD   1 year ago Simple chronic bronchitis [facility])   [place] [facility] Sowles, Krichna, MD   1 year ago Swelling of lower extremity   [facility] Bernardo Fend, DO       Future Appointments             In 4 days Sowles, Krichna, MD [facility], [facility]

## 2023-12-21 NOTE — Telephone Encounter (Signed)
 Py has appt on 12-25-2023 w Sowles

## 2023-12-25 ENCOUNTER — Encounter: Payer: Self-pay | Admitting: Family Medicine

## 2023-12-25 ENCOUNTER — Ambulatory Visit (INDEPENDENT_AMBULATORY_CARE_PROVIDER_SITE_OTHER): Payer: Medicare HMO | Admitting: Family Medicine

## 2023-12-25 ENCOUNTER — Other Ambulatory Visit: Payer: Self-pay | Admitting: Family Medicine

## 2023-12-25 VITALS — BP 134/78 | HR 84 | Resp 16 | Ht 72.0 in | Wt 287.2 lb

## 2023-12-25 DIAGNOSIS — E1169 Type 2 diabetes mellitus with other specified complication: Secondary | ICD-10-CM | POA: Diagnosis not present

## 2023-12-25 DIAGNOSIS — E785 Hyperlipidemia, unspecified: Secondary | ICD-10-CM

## 2023-12-25 DIAGNOSIS — D692 Other nonthrombocytopenic purpura: Secondary | ICD-10-CM | POA: Diagnosis not present

## 2023-12-25 DIAGNOSIS — G894 Chronic pain syndrome: Secondary | ICD-10-CM | POA: Diagnosis not present

## 2023-12-25 DIAGNOSIS — F1021 Alcohol dependence, in remission: Secondary | ICD-10-CM | POA: Diagnosis not present

## 2023-12-25 DIAGNOSIS — E782 Mixed hyperlipidemia: Secondary | ICD-10-CM

## 2023-12-25 DIAGNOSIS — J410 Simple chronic bronchitis: Secondary | ICD-10-CM

## 2023-12-25 DIAGNOSIS — F338 Other recurrent depressive disorders: Secondary | ICD-10-CM

## 2023-12-25 DIAGNOSIS — I10 Essential (primary) hypertension: Secondary | ICD-10-CM | POA: Diagnosis not present

## 2023-12-25 DIAGNOSIS — R0683 Snoring: Secondary | ICD-10-CM

## 2023-12-25 DIAGNOSIS — F5104 Psychophysiologic insomnia: Secondary | ICD-10-CM

## 2023-12-25 DIAGNOSIS — R400 Somnolence: Secondary | ICD-10-CM

## 2023-12-25 LAB — POCT GLYCOSYLATED HEMOGLOBIN (HGB A1C): Hemoglobin A1C: 6.3 % — AB (ref 4.0–5.6)

## 2023-12-25 MED ORDER — AMLODIPINE BESY-BENAZEPRIL HCL 5-20 MG PO CAPS
1.0000 | ORAL_CAPSULE | Freq: Every day | ORAL | 1 refills | Status: DC
Start: 2023-12-25 — End: 2024-04-23

## 2023-12-25 MED ORDER — ATORVASTATIN CALCIUM 40 MG PO TABS
40.0000 mg | ORAL_TABLET | Freq: Every day | ORAL | 1 refills | Status: DC
Start: 2023-12-25 — End: 2024-04-23

## 2023-12-25 MED ORDER — ZOLPIDEM TARTRATE 10 MG PO TABS: 10.0000 mg | ORAL_TABLET | Freq: Every day | ORAL | 0 refills | Status: DC

## 2023-12-25 MED ORDER — TIZANIDINE HCL 4 MG PO TABS
4.0000 mg | ORAL_TABLET | Freq: Three times a day (TID) | ORAL | 1 refills | Status: DC
Start: 2023-12-25 — End: 2024-04-04

## 2023-12-25 NOTE — Telephone Encounter (Signed)
 Medication Refill -  Most Recent Primary Care Visit:  Provider: GLENARD MIRE  Department: [facility]  Visit Type: OFFICE VISIT  Date: 07/18/2023  Medication:  zolpidem  (AMBIEN ) 5 MG tablet  Has the patient contacted their pharmacy? Yes (Agent: If no, request that the patient contact the pharmacy for the refill. If patient does not wish to contact the pharmacy document the reason why and proceed with request.) Camie [facility] calling for the pt to request this refill Is this the correct pharmacy for this prescription? Yes If no, delete pharmacy and type the correct one.  This is the patient's preferred pharmacy:   [facility] - [place], [place] - [address] [place] [address] Phone: [phone] Fax: [phone]   Has the prescription been filled recently? No  Is the patient out of the medication? Yes  Has the patient been seen for an appointment in the last year OR does the patient have an upcoming appointment? Yes  Can we respond through MyChart? Yes  Agent: Please be advised that Rx refills may take up to 3 business days. We ask that you follow-up with your pharmacy.

## 2023-12-25 NOTE — Progress Notes (Signed)
 Name: Carl Burke   MRN: [id number]    DOB: 08/03/1956   Date:12/25/2023       Progress Note  Subjective  Chief Complaint  Chief Complaint  Patient presents with   Medical Management of Chronic Issues    HPI  Chronic bronchitis: he has been smoking since age 68, he states initially just a few cigarettes, in his 40's and 79 's he smoked up to 3 packs per day. 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 He is compliant with medications    DMII: associated dyslipidemia and HTN, A1C is down from 6.5 % to 6.3 % , diet controlled and takes bp and lipid medication. Discussed importance of yearly eye exam and foot exam. He denies polyphagia, polydipsia or polyuria    Chronic back pain, neck pain, knee pain radiculitis/also hands: used to take narcotics , seen by pain clinic, but he states medications did not help so he stopped everything , only on Zanaflex  three times a day. Pain is worse during cold months    Dyslipidemia: taking Atorvastatin   but off  Vascepa  because of cost, last LDL was 91, triglycerides back to normal . We will continue current regiment    Morbid obesity : BMI above 35 with co-morbidies , such as HTN, chronic back pain, dyslipidemia.  He gained weight again since last visit, states not as compliant with diet over the holidays    Umbilical hernia: hernia has been present for many years, he saw surgeon, he is wearing a compression vest .  Still not ready for surgery . Unchanged    Insomnia: chronic, takes Ambien  every other night and it helps him fall and stay asleep. He has Medicare now and insurance dose not pay for 6.25 CR dose , the  5 mg dose is not helping, but he would like to try Ambien  10 mg, he takes prn when unable to sleep. No side effects of medication in the past . He is able to fall asleep and stay asleep for about 3-4 hours, he wakes up due to pain and goes to his lounger and sleeps about 2- 3 more hours.    History of alcoholism: he states used to drink a bottle of hard liquor per night in his 30's and 30 's he had 4 DUI's in his life time, but states now only drinks occasionally and no more than two shots of hard liquor and sometimes a beer. He makes moonshine and drinks it sometimes    Perennial AR: he is taking loratadine  and nasal spray again, he goes from congestion to rhinorrhea. He states currently feeling congested    Dysthymia/Seasonal affective disorder:phq 9 is normal today. He does not want medications, he has worsening of symptoms during winter months, he states pain is worse in the winter and cannot do anything. Continues to refuse medication   Senile purpura: on arms, unchanged, reassurance given    Left gynecomastia: seen by surgeon, had mammogram, symptoms are stable and does not want to see Endo at this time    Patient Active Problem List  Diagnosis Date Noted   History of colonic polyps 08/24/2023   Family history of colon cancer in father 08/24/2023   Polyp of ascending colon 08/24/2023   Polyp of sigmoid colon 08/24/2023   Diabetes mellitus type 2, diet-controlled (HCC) 07/18/2023   B12 deficiency 07/18/2023   Insomnia 07/18/2023   Vitamin D  deficiency 07/18/2023   Chronic pain syndrome 07/18/2023   Simple chronic bronchitis (HCC) 01/11/2023   Morbid obesity (HCC) 01/11/2023   Senile purpura (HCC) 04/04/2022   History of alcoholism (HCC) 04/04/2022   Seasonal affective disorder (HCC) 01/19/2021   Dyslipidemia 10/31/2019   Umbilical hernia without obstruction and without gangrene 07/17/2019   Pre-diabetes 01/07/2019   Degeneration of lumbosacral intervertebral disc 03/28/2018    Full thickness rotator cuff tear 03/28/2018   Neck pain 03/28/2018   Hyperlipidemia 12/20/2016   Leukocytosis 07/17/2016   Chronic shoulder pain (Right) 04/12/2016   Cervical spondylosis 04/12/2016   Chronic upper back pain (Location of Secondary source of pain) (Bilateral) (midline) 04/12/2016   Essential hypertension 04/12/2016   Lumbosacral radiculopathy at S1 (Left) 04/12/2016   Rotator cuff arthropathy of right shoulder 08/15/2013    Past Surgical History:  Procedure Laterality Date   COLONOSCOPY     COLONOSCOPY WITH PROPOFOL  N/A 08/23/2018   Procedure: COLONOSCOPY WITH PROPOFOL ;  Surgeon: Unk Corinn Skiff, MD;  Location: [facility] ENDOSCOPY;  Service: Gastroenterology;  Laterality: N/A;   COLONOSCOPY WITH PROPOFOL  N/A 08/24/2023   Procedure: COLONOSCOPY WITH PROPOFOL ;  Surgeon: Unk Corinn Skiff, MD;  Location: [facility] ENDOSCOPY;  Service: Gastroenterology;  Laterality: N/A;   ESOPHAGOGASTRODUODENOSCOPY N/A 07/19/2016   Procedure: ESOPHAGOGASTRODUODENOSCOPY (EGD);  Surgeon: Lupita FORBES Commander, MD;  Location[facility] ENDOSCOPY;  Service: Endoscopy;  Laterality: N/A;   ESOPHAGOGASTRODUODENOSCOPY (EGD) WITH PROPOFOL  N/A 08/23/2018   Procedure: ESOPHAGOGASTRODUODENOSCOPY (EGD) WITH PROPOFOL ;  Surgeon: Unk Corinn Skiff, MD;  Location: [facility] ENDOSCOPY;  Service: Gastroenterology;  Laterality: N/A;   POLYPECTOMY  08/24/2023   Procedure: POLYPECTOMY;  Surgeon: Unk Corinn Skiff, MD;  Location: [facility] ENDOSCOPY;  Service: Gastroenterology;;   ROTATOR CUFF REPAIR Left 2011   ROTATOR CUFF REPAIR Right 2013    Family History  Problem Relation Age of Onset   Heart disease Mother    Colon cancer Father    Cancer Sister    Breast cancer Sister 45   Cancer Sister     Social History   Tobacco Use   Smoking status: Former    Current packs/day: 0.00    Average packs/day: 0.5 packs/day for 51.4 years (25.7 ttl pk-yrs)    Types: Cigarettes    Start date: 09/30/1970    Quit date: 03/12/2022    Years  since quitting: 1.7   Smokeless tobacco: Never  Substance Use Topics   Alcohol use: Yes    Alcohol/week: 0.0 standard drinks of alcohol    Comment: Rare/occassional      Current Outpatient Medications:    amLODipine -benazepril  (LOTREL) 5-20 MG capsule, Take 1 capsule by mouth daily., Disp: 90 capsule, Rfl: 1   atorvastatin  (LIPITOR) 40 MG tablet, Take 1 tablet (40 mg total) by mouth daily., Disp: 90 tablet, Rfl: 1   azelastine  (ASTELIN ) 0.1 % nasal spray, Place 1 spray into both nostrils 2 (two) times daily. Use in each nostril as directed, Disp: 90 mL, Rfl: 1   Fluticasone -Umeclidin-Vilant (TRELEGY ELLIPTA ) 100-62.5-25 MCG/ACT AEPB, Inhale 1 puff into the lungs daily., Disp: 1 each, Rfl: 5   Ibuprofen  200 MG CAPS, Take by mouth., Disp: , Rfl:    loratadine  (CLARITIN ) 10  MG tablet, Take 1 tablet (10 mg total) by mouth 2 (two) times daily., Disp: 180 tablet, Rfl: 1   tiZANidine  (ZANAFLEX ) 4 MG tablet, Take 1 tablet (4 mg total) by mouth 3 (three) times daily., Disp: 270 tablet, Rfl: 1   Vitamin D , Ergocalciferol , (DRISDOL ) 1.25 MG (50000 UNIT) CAPS capsule, TAKE 1 CAPSULE BY MOUTH EVERY 7  DAYS, Disp: 12 capsule, Rfl: 3   zolpidem  (AMBIEN ) 5 MG tablet, Take 1 tablet (5 mg total) by mouth at bedtime., Disp: 90 tablet, Rfl: 0  Allergies  Allergen Reactions   Naproxen Sodium Rash and Other (See Comments)    800 mg tablets that the MD prescribed for pain.    I personally reviewed active problem list, medication list, allergies, family history with the patient/caregiver today.   ROS  Ten systems reviewed and is negative except as mentioned in HPI    Objective  Vitals:   12/25/23 1433  BP: 134/78  Pulse: 84  Resp: 16  SpO2: 94%  Weight: 287 lb 3.2 oz (130.3 kg)  Height: 6' (1.829 m)    Body mass index is 38.95 kg/m.  Physical Exam  Constitutional: Patient appears well-developed and well-nourished. Obese  No distress.  HEENT: head atraumatic, normocephalic, pupils equal and  reactive to light,, neck supple Cardiovascular: Normal rate, regular rhythm and normal heart sounds.  No murmur heard. No BLE edema. Pulmonary/Chest: Effort normal and breath sounds normal. No respiratory distress. Abdominal: Soft.  There is no tenderness. Psychiatric: Patient has a normal mood and affect. behavior is normal. Judgment and thought content normal.   Recent Results (from the past 2160 hours)  POCT glycosylated hemoglobin (Hb A1C)     Status: Abnormal   Collection Time: 12/25/23  2:38 PM  Result Value Ref Range   Hemoglobin A1C 6.3 (A) 4.0 - 5.6 %   HbA1c POC (<> result, manual entry)     HbA1c, POC (prediabetic range)     HbA1c, POC (controlled diabetic range)      Diabetic Foot Exam:     PHQ2/9:    12/25/2023    2:32 PM 07/18/2023    9:16 AM 07/04/2023    8:17 AM 01/11/2023    8:17 AM 10/11/2022    8:27 AM  Depression screen PHQ 2/9  Decreased Interest 1 0 0 1 0  Down, Depressed, Hopeless 1 0 0 1 0  PHQ - 2 Score 2 0 0 2 0  Altered sleeping 1 0 0 0 0  Tired, decreased energy 1 0 0 0 0  Change in appetite 1 0 0 0 0  Feeling bad or failure about yourself  0 0 0 0 0  Trouble concentrating 0 0 0 0 0  Moving slowly or fidgety/restless 0 0 0 0 0  Suicidal thoughts 0 0 0 0 0  PHQ-9 Score 5 0 0 2 0  Difficult doing work/chores Somewhat difficult        phq 9 is positive   Fall Risk:    12/25/2023    2:27 PM 07/18/2023    9:16 AM 07/04/2023    8:16 AM 01/11/2023    8:17 AM 10/11/2022    8:27 AM  Fall Risk   Falls in the past year? 0 0 0 0 1  Number falls in past yr: 0   0 1  Injury with Fall? 0   0 0  Risk for fall due to : No Fall Risks No Fall Risks No Fall Risks No Fall Risks History  of fall(s)  Follow up Falls prevention discussed;Education provided;Falls evaluation completed Falls prevention discussed Falls prevention discussed;Education provided;Falls evaluation completed Falls prevention discussed Falls prevention discussed;Education provided;Falls  evaluation completed    Assessment & Plan     1. Dyslipidemia associated with type 2 diabetes mellitus (HCC) (Primary)  - POCT glycosylated hemoglobin (Hb A1C) - Microalbumin / creatinine urine ratio  2. Morbid obesity (HCC)  - Ambulatory referral to Sleep Studies  3. Senile purpura (HCC)  Reassurance given   4. Seasonal affective disorder (HCC)  Refuses medications  5. Simple chronic bronchitis (HCC)  On trelegy   6. History of alcoholism (HCC)  In remission   7. Essential hypertension  - amLODipine -benazepril  (LOTREL) 5-20 MG capsule; Take 1 capsule by mouth daily.  Dispense: 90 capsule; Refill: 1  8. Chronic pain syndrome  - tiZANidine  (ZANAFLEX ) 4 MG tablet; Take 1 tablet (4 mg total) by mouth 3 (three) times daily.  Dispense: 270 tablet; Refill: 1  9. Chronic insomnia  - zolpidem  (AMBIEN ) 10 MG tablet; Take 1 tablet (10 mg total) by mouth at bedtime.  Dispense: 90 tablet; Refill: 0  10. Uncontrolled daytime somnolence  - Ambulatory referral to Sleep Studies  11. Snores  - Ambulatory referral to Sleep Studies ESS of 13   12. Mixed hyperlipidemia  - atorvastatin  (LIPITOR) 40 MG tablet; Take 1 tablet (40 mg total) by mouth daily.  Dispense: 90 tablet; Refill: 1

## 2024-01-26 ENCOUNTER — Other Ambulatory Visit: Payer: Self-pay | Admitting: Family Medicine

## 2024-01-26 DIAGNOSIS — F5104 Psychophysiologic insomnia: Secondary | ICD-10-CM

## 2024-02-08 ENCOUNTER — Ambulatory Visit: Payer: Medicare HMO | Attending: Otolaryngology

## 2024-02-08 DIAGNOSIS — G4700 Insomnia, unspecified: Secondary | ICD-10-CM | POA: Diagnosis not present

## 2024-02-08 DIAGNOSIS — Q796 Ehlers-Danlos syndrome, unspecified: Secondary | ICD-10-CM | POA: Insufficient documentation

## 2024-02-08 DIAGNOSIS — G4733 Obstructive sleep apnea (adult) (pediatric): Secondary | ICD-10-CM | POA: Diagnosis not present

## 2024-02-08 DIAGNOSIS — R0683 Snoring: Secondary | ICD-10-CM | POA: Diagnosis present

## 2024-02-08 DIAGNOSIS — R7303 Prediabetes: Secondary | ICD-10-CM | POA: Insufficient documentation

## 2024-02-08 DIAGNOSIS — F339 Major depressive disorder, recurrent, unspecified: Secondary | ICD-10-CM | POA: Diagnosis not present

## 2024-02-08 DIAGNOSIS — Z6838 Body mass index (BMI) 38.0-38.9, adult: Secondary | ICD-10-CM | POA: Diagnosis not present

## 2024-02-08 DIAGNOSIS — E669 Obesity, unspecified: Secondary | ICD-10-CM | POA: Diagnosis not present

## 2024-02-08 DIAGNOSIS — R0681 Apnea, not elsewhere classified: Secondary | ICD-10-CM | POA: Diagnosis present

## 2024-03-07 ENCOUNTER — Other Ambulatory Visit: Payer: Self-pay | Admitting: Family Medicine

## 2024-03-07 DIAGNOSIS — N6342 Unspecified lump in left breast, subareolar: Secondary | ICD-10-CM

## 2024-03-07 DIAGNOSIS — N643 Galactorrhea not associated with childbirth: Secondary | ICD-10-CM

## 2024-03-09 ENCOUNTER — Emergency Department

## 2024-03-09 ENCOUNTER — Emergency Department
Admission: EM | Admit: 2024-03-09 | Discharge: 2024-03-09 | Disposition: A | Discharge status: Home / Self Care | Attending: Student in an Organized Health Care Education/Training Program | Admitting: Student in an Organized Health Care Education/Training Program

## 2024-03-09 ENCOUNTER — Other Ambulatory Visit: Payer: Self-pay

## 2024-03-09 DIAGNOSIS — N6342 Unspecified lump in left breast, subareolar: Secondary | ICD-10-CM | POA: Diagnosis present

## 2024-03-09 DIAGNOSIS — N63 Unspecified lump in unspecified breast: Secondary | ICD-10-CM

## 2024-03-09 DIAGNOSIS — N611 Abscess of the breast and nipple: Secondary | ICD-10-CM | POA: Diagnosis present

## 2024-03-09 LAB — COMPREHENSIVE METABOLIC PANEL WITH GFR
ALT: 52 U/L — ABNORMAL HIGH (ref 0–44)
AST: 43 U/L — ABNORMAL HIGH (ref 15–41)
Albumin: 3.8 g/dL (ref 3.5–5.0)
Alkaline Phosphatase: 63 U/L (ref 38–126)
Anion gap: 11 (ref 5–15)
BUN: 10 mg/dL (ref 8–23)
CO2: 20 mmol/L — ABNORMAL LOW (ref 22–32)
Calcium: 9.2 mg/dL (ref 8.9–10.3)
Chloride: 103 mmol/L (ref 98–111)
Creatinine, Ser: 0.69 mg/dL (ref 0.61–1.24)
GFR, Estimated: >60 mL/min (ref >60)
Glucose, Bld: 107 mg/dL — ABNORMAL HIGH (ref 70–99)
Potassium: 4.1 mmol/L (ref 3.5–5.1)
Sodium: 134 mmol/L — ABNORMAL LOW (ref 135–145)
Total Bilirubin: 0.6 mg/dL (ref 0.0–1.2)
Total Protein: 7 g/dL (ref 6.5–8.1)

## 2024-03-09 LAB — CBC WITH DIFFERENTIAL/PLATELET
Abs Immature Granulocytes: 0.06 10*3/uL (ref 0.00–0.07)
Basophils Absolute: 0.1 10*3/uL (ref 0.0–0.1)
Basophils Relative: 1 %
Eosinophils Absolute: 0.2 10*3/uL (ref 0.0–0.5)
Eosinophils Relative: 1 %
HCT: 54.8 % — ABNORMAL HIGH (ref 39.0–52.0)
Hemoglobin: 17.9 g/dL — ABNORMAL HIGH (ref 13.0–17.0)
Immature Granulocytes: 0 %
Lymphocytes Relative: 23 %
Lymphs Abs: 3.4 10*3/uL (ref 0.7–4.0)
MCH: 31.6 pg (ref 26.0–34.0)
MCHC: 32.7 g/dL (ref 30.0–36.0)
MCV: 96.6 fL (ref 80.0–100.0)
Monocytes Absolute: 1.1 10*3/uL — ABNORMAL HIGH (ref 0.1–1.0)
Monocytes Relative: 7 %
Neutro Abs: 9.9 10*3/uL — ABNORMAL HIGH (ref 1.7–7.7)
Neutrophils Relative %: 68 %
Platelets: 239 10*3/uL (ref 150–400)
RBC: 5.67 MIL/uL (ref 4.22–5.81)
RDW: 13.6 % (ref 11.5–15.5)
WBC: 14.7 10*3/uL — ABNORMAL HIGH (ref 4.0–10.5)
nRBC: 0 % (ref 0.0–0.2)

## 2024-03-09 LAB — LACTIC ACID, PLASMA: Lactic Acid, Venous: 1.8 mmol/L (ref 0.5–1.9)

## 2024-03-09 NOTE — ED Provider Notes (Signed)
 [facility] Provider Note    Event Date/Time   First MD Initiated Contact with Patient 03/09/24 1347     (approximate)   History   Wound Infection   HPI  Carl Burke is a 68 y.o. male presents to the ER for evaluation of recurrent firm mass behind his left nipple is now draining green liquid.  He is currently on doxycycline.  Has had this evaluated in the past as he has a family history of breast cancer and had a mammogram last year which was for reportedly normal.  He does report some discomfort with this.  Denies any redness.  Has not felt much significant change after antibiotic.     Physical Exam   Triage Vital Signs: ED Triage Vitals [03/09/24 1311]  Encounter Vitals Group     BP (!) 174/88     Systolic BP Percentile      Diastolic BP Percentile      Pulse Rate 90     Resp 20     Temp 98 F (36.7 C)     Temp Source Oral     SpO2 100 %     Weight      Height      Head Circumference      Peak Flow      Pain Score 4     Pain Loc      Pain Education      Exclude from Growth Chart     Most recent vital signs: Vitals:   03/09/24 1311  BP: (!) 174/88  Pulse: 90  Resp: 20  Temp: 98 F (36.7 C)  SpO2: 100%     Constitutional: Alert  Eyes: Conjunctivae are normal.  Head: Atraumatic. Nose: No congestion/rhinnorhea. Mouth/Throat: Mucous membranes are moist.   Neck: Painless ROM.  Cardiovascular:   Good peripheral circulation. Respiratory: Normal respiratory effort.  No retractions.  Skin:  Skin is warm, dry and intact.  Left breast with golf ball sized firm tissue behind the areola with scant amount of yellow-greenish drainage from the nipple itself.  No retraction of the nipple.  No overlying cellulitis erythema or blistering.      ED Results / Procedures / Treatments   Labs (all labs ordered are listed, but only abnormal results are displayed) Labs Reviewed  COMPREHENSIVE METABOLIC PANEL WITH GFR - Abnormal; Notable  for the following components:      Result Value   Sodium 134 (*)    CO2 20 (*)    Glucose, Bld 107 (*)    AST 43 (*)    ALT 52 (*)    All other components within normal limits  CBC WITH DIFFERENTIAL/PLATELET - Abnormal; Notable for the following components:   WBC 14.7 (*)    Hemoglobin 17.9 (*)    HCT 54.8 (*)    Neutro Abs 9.9 (*)    Monocytes Absolute 1.1 (*)    All other components within normal limits  LACTIC ACID, PLASMA     EKG     RADIOLOGY Please see ED Course for my review and interpretation.  I personally reviewed all radiographic images ordered to evaluate for the above acute complaints and reviewed radiology reports and findings.  These findings were personally discussed with the patient.  Please see medical record for radiology report.    PROCEDURES:  Critical Care performed:   Procedures   MEDICATIONS ORDERED IN ED: Medications - No data to display   IMPRESSION / MDM / ASSESSMENT AND  PLAN / ED COURSE  I reviewed the triage vital signs and the nursing notes.                              Differential diagnosis includes, but is not limited to, abscess, mass, galactocele, mastitis, cellulitis  Patient presenting to the ER for evaluation of symptoms as described above.  Based on symptoms, risk factors and considered above differential, this presenting complaint could reflect a potentially life-threatening illness therefore the patient will be placed on continuous pulse oximetry and telemetry for monitoring.  Laboratory evaluation will be sent to evaluate for the above complaints.  Will order ultrasound to further evaluate for abscess.  Certainly concerning for possible mass given family history.  Has outpatient mammogram scheduled for Wednesday.  It does not appear to be adhered to the chest wall therefore suspected ultrasound will be the best diagnostic test for this presentation.  Patient be signed out oncoming physician pending follow-up imaging.        FINAL CLINICAL IMPRESSION(S) / ED DIAGNOSES   Final diagnoses:  Breast mass in male     Rx / DC Orders   ED Discharge Orders     None        Note:  This document was prepared using Dragon voice recognition software and may include unintentional dictation errors.    Willy Eddy, MD 03/09/24 [phone]

## 2024-03-09 NOTE — ED Provider Notes (Signed)
 US  IMPRESSION: 3.3 cm complex mass or fluid collection in the retroareolar portion of the left breast, possibly an abscess. Neoplasm not excluded. Correlate clinically.  Further evaluation recommended with dedicated mammography and ultrasound at a dedicated breast facility.  Discussed findings with patient. He already has mammogram scheduled for next week. States he has also already been referred to general surgery. Did encourage patient to follow up as scheduled.   Phineas Semen, MD 03/09/24 [phone]

## 2024-03-09 NOTE — ED Notes (Signed)
 Pt resting comfortably, waiting for ultrasound. Denies needs at this time.

## 2024-03-09 NOTE — ED Notes (Signed)
 Pt to ED for long-term, fluctuating R breast mass under areola. Mass is movable. States since Thursday (2d) has had green discharge from nipple. Prior to this, discharge was clear-yellow. Mass has been present for about 2 years. Pending mammogram.

## 2024-03-09 NOTE — Discharge Instructions (Addendum)
 Please follow up with the mammogram and general surgery.

## 2024-03-09 NOTE — ED Triage Notes (Signed)
 Pt to ED via POV from home. Pt reports infection to left pectoral and under areola. Pt reports this has been intermittent over the last few months. Pt has been seen by multiple doctors. Pt reports this time that it appears his pec is tender to touch and leaking green drainage. Pt seen at [facility] and has a mammogram for next Wednesday but doesn't want to to wait due to it might going away before appointment. Pt is currently on antibiotics.

## 2024-03-13 ENCOUNTER — Other Ambulatory Visit: Payer: Self-pay | Admitting: Family Medicine

## 2024-03-13 ENCOUNTER — Ambulatory Visit
Admission: RE | Admit: 2024-03-13 | Discharge: 2024-03-13 | Disposition: A | Discharge status: Home / Self Care | Source: Ambulatory Visit | Attending: Family Medicine | Admitting: Family Medicine

## 2024-03-13 DIAGNOSIS — N643 Galactorrhea not associated with childbirth: Secondary | ICD-10-CM

## 2024-03-13 DIAGNOSIS — N6342 Unspecified lump in left breast, subareolar: Secondary | ICD-10-CM | POA: Diagnosis present

## 2024-03-14 ENCOUNTER — Ambulatory Visit
Admission: RE | Admit: 2024-03-14 | Discharge: 2024-03-14 | Disposition: A | Discharge status: Home / Self Care | Source: Ambulatory Visit | Attending: Family Medicine | Admitting: Family Medicine

## 2024-03-14 DIAGNOSIS — N643 Galactorrhea not associated with childbirth: Secondary | ICD-10-CM | POA: Insufficient documentation

## 2024-03-14 DIAGNOSIS — N6342 Unspecified lump in left breast, subareolar: Secondary | ICD-10-CM

## 2024-03-14 HISTORY — PX: BREAST BIOPSY: SHX20

## 2024-03-14 MED ORDER — LIDOCAINE HCL 1 % IJ SOLN
5.0000 mL | Freq: Once | INTRAMUSCULAR | Status: AC
Start: 2024-03-14 — End: 2024-03-14
  Administered 2024-03-14: 5 mL

## 2024-03-14 MED ORDER — LIDOCAINE HCL 1 % IJ SOLN
5.0000 mL | Freq: Once | INTRAMUSCULAR | Status: AC
  Administered 2024-03-14: 5 mL
  Filled 2024-03-14: qty 5

## 2024-03-15 LAB — SURGICAL PATHOLOGY

## 2024-03-15 LAB — CYTOLOGY - NON PAP

## 2024-03-18 ENCOUNTER — Ambulatory Visit: Payer: Self-pay | Admitting: Surgery

## 2024-03-18 NOTE — H&P (View-Only) (Signed)
 Subjective:   CC: Breast lesion [N64.9] HPI:  Carl Burke is a 68 y.o. male who was referred by Marilynn Rail.,* for evaluation of above. First noted 67yrs ago.  Recurrent swelling deep to left nipple area.  New onset discharge noted from nipple recently, dx with mastitis and placed on abx.     Past Medical History:  has no past medical history on file.  Past Surgical History:  has no past surgical history on file.  Family History: two sisters with hx of breast CA  Social History:  reports that he has been smoking cigarettes. He has never used smokeless tobacco. He reports current alcohol use. Drug use questions deferred to the physician.  Current Medications: has a current medication list which includes the following prescription(s): albuterol mdi (proventil, ventolin, proair) hfa, amlodipine-benazepril, atorvastatin, tizanidine, trelegy ellipta, zolpidem, and zolpidem.  Allergies:  Allergies as of 03/18/2024 - Reviewed 03/18/2024  Allergen Reaction Noted   Naproxen Other (See Comments) and Rash 04/12/2016    ROS:  A 15 point review of systems was performed and was negative except as noted in HPI   Objective:     BP (!) 166/66   Pulse 70   Ht 180.3 cm (5\' 11" )   Wt (!) 130.2 kg (287 lb)   BMI 40.03 kg/m   Constitutional :  No distress, cooperative, alert  Lymphatics/Throat:  Supple with no lymphadenopathy  Respiratory:  Clear to auscultation bilaterally  Cardiovascular:  Regular rate and rhythm  Gastrointestinal: Soft, non-tender, non-distended, no organomegaly.  Musculoskeletal: Steady gait and movement  Skin: Cool and moist  Psychiatric: Normal affect, non-agitated, not confused  Breast: Normal appearance and no palpable abnormality in right breast and axilla.  Left breast swelling, induration, erythema, and bloody nipple discharge. Chaperone present for exam.      LABS:  CYTOLOGY - NON GYN CYTOLOGY - NON PAP [facility] [address] [place], [place] [address] Telephone [phone] or [phone] Fax [phone]  CYTOPATHOLOGY REPORT   Accession #: [id number] Patient Name: GRANT, SWAGER Visit # : [id number]  MRN: [id number] Physician: Mir, Mauri Reading DOB/Age September 16, 1956 (Age: 70) Gender: M Collected Date: 03/14/2024 Received Date: 03/14/2024  FINAL DIAGNOSIS STATEMENT Of SPECIMEN ADEQUACY:  INTERPRETATION(S):      - ACUTE INFLAMMATORY CELLS AND BENIGN SQUAMOUS CELLS.      DATE SIGNED OUT: 03/15/2024 ELECTRONIC SIGNATURE : [place] Md, Mark, Pathologist, Electronic Signature   CASE COMMENTS   CLINICAL HISTORY  SOURCE OF SPECIMEN(S) Breast, Cyst/Fluid  SPECIMEN COMMENTS: SPECIMEN CLINICAL INFORMATION: 1. Recurrent left breast fluid collections    Gross Description Specimen: Received is/are 12cc of red fluid in a sterile syringe      Prepared:      # Smears: 0      # Concentration Technique Slides (i.e. ThinPrep): 1      # Cell Block: 1      # Diff-Quick Stain: 0        Report signed out from the following location(s) [place] [address], [place] [address] CLIA #: [id number]  [facility] [address] [place], [place] [address] CLIA #: [id number]    SURGICAL PATHOLOGY SURGICAL PATHOLOGY [facility] [address] [place], [place] [address] Telephone [phone] or [phone] Fax [phone]  REPORT OF SURGICAL PATHOLOGY   Accession #: [id number] Patient Name: HRIDAY, STAI Visit # : [id number]  MRN: [id number] Physician: Mir,  Mauri Reading DOB/Age [birth year] (Age: 27) Gender: M Collected Date: 03/14/2024 Received Date: 03/14/2024  FINAL DIAGNOSIS       1. Breast, left, needle core biopsy, retroareolar, 12 o'clock (no clip) :      - BENIGN SKIN WITH MIXED INFLAMMATORY INFILTRATE AND HYPEREMIA.      - BENIGN DUCTS IN DEEP TISSUE.      - NEGATIVE FOR  MALIGNANCY.       DATE SIGNED OUT: 03/15/2024 ELECTRONIC SIGNATURE : [place] Md, Mark, Pathologist, Electronic Signature  MICROSCOPIC DESCRIPTION 1. Comment: The findings were conveyed to Randa Lynn via Secure Chat at 10:35 a.m. on 03/15/24.  CASE COMMENTS STAINS USED IN DIAGNOSIS: H&E-2 H&E-3 H&E-4 H&E    CLINICAL HISTORY  SPECIMEN(S) OBTAINED 1. Breast, left, needle core biopsy, Retroareolar, 12 O'clock (no Clip)  SPECIMEN COMMENTS: 1. TIF: 9:25 AM, CIT < 1 min; recurrent complex fluid collection in retroareolar left breast with recent development of greenish white nipple discharge; patient has a strong family history of breast cancer, positive in two sisters and maternal grandmother SPECIMEN CLINICAL INFORMATION: 1. Most likely recurrent abscess, malignancy considered less likely    Gross Description 1. Received in formalin, time in formalin 9:25 a.m. and CIT less than 1 minute, are fragments of tan to hemorrhagic soft tissue measuring 0.7 x 0.6 x 0.2 cm in aggregate.   The specimen is submitted in toto.  (GRP:kh 03/14/24)        Report signed out from the following location(s) [place] [address], [place] [address] CLIA #: [id number]  [facility] [address] [place], [place] [address] CLIA #: [id number]   Component Ref Range & Units (hover) 4 d ago  Specimen Description BREAST Performed at [facility], [address] [place], [place] [address]  Special Requests NONE Performed at [facility], [address] [place], [place] [address]  Gram Stain ABUNDANT WBC PRESENT, PREDOMINANTLY PMN MODERATE GRAM POSITIVE COCCI IN CHAINS Performed at [facility], [address] [place], [place] [address]  Culture ABUNDANT STAPHYLOCOCCUS LUGDUNENSIS NO ANAEROBES ISOLATED; CULTURE IN PROGRESS FOR 5 DAYS  Report Status PENDING  Organism ID, Bacteria STAPHYLOCOCCUS LUGDUNENSIS  Resulting  [facility]     Susceptibility   Staphylococcus lugdunensis    MIC    CIPROFLOXACIN <=0.5 SENSI... Sensitive    CLINDAMYCIN <=0.25 SENS... Sensitive    ERYTHROMYCIN <=0.25 SENS... Sensitive    GENTAMICIN <=0.5 SENSI... Sensitive    Inducible Clindamycin NEGATIVE Sensitive    OXACILLIN 1 SENSITIVE Sensitive    RIFAMPIN <=0.5 SENSI... Sensitive    TETRACYCLINE <=1 SENSITIVE Sensitive    TRIMETH/SULFA <=10 SENSIT... Sensitive    VANCOMYCIN 1 SENSITIVE Sensitive           Susceptibility Comments  Staphylococcus lugdunensis  ABUNDANT STAPHYLOCOCCUS LUGDUNENSIS       RADS: CLINICAL DATA:  LEFT breast palpable for a year and a half. Patient  can expressed milky fluid from this area and currently the fluid is  green in appearance.   EXAM:  DIGITAL DIAGNOSTIC BILATERAL MAMMOGRAM WITH TOMOSYNTHESIS AND CAD;  ULTRASOUND LEFT BREAST LIMITED   TECHNIQUE:  Bilateral digital diagnostic mammography and breast tomosynthesis  was performed. The images were evaluated with computer-aided  detection. ; Targeted ultrasound examination of the left breast was  performed.   COMPARISON: Previous exam(s).   ACR Breast Density Category b: There are scattered areas of  fibroglandular density.   FINDINGS:  Spot  compression tomosynthesis views were obtained of the site of  palpable concern in the LEFT breast. There is an oval mass noted  subjacent to the site of palpable concern in the LEFT retroareolar  breast. No discrete internal fat is definitively identified. No  suspicious mass, distortion, or microcalcifications are identified  to suggest presence of malignancy in the RIGHT breast.   On physical exam, there is a mass in the LEFT retroareolar breast.   Targeted ultrasound was performed of the site of palpable concern.  In the LEFT retroareolar breast at 12 o'clock, there is a oval  heterogeneously hypoechoic mass with mildly lobular margins. It  measures 30 x 34 x 29 mm.  This corresponds to the mass noted  mammographically.   Targeted ultrasound was performed of the LEFT axilla. No suspicious  LEFT axillary lymph nodes are visualized.   IMPRESSION:  1. There is a 34 mm complex possibly cystic mass in the LEFT  retroareolar breast. Recommend ultrasound-guided aspiration with  potential conversion to biopsy for definitive characterization.  Recommend any obtained fluid be sent for cytology as well as  microbiology given long-term persistence.  2. No suspicious LEFT axillary adenopathy.  3. No mammographic evidence of malignancy in the RIGHT breast.   RECOMMENDATION:  LEFT breast ultrasound-guided aspiration with potential conversion  to biopsy x1   I have discussed the findings and recommendations with the patient.  The aspiration with potential conversion to biopsy procedure was  discussed with the patient and questions were answered. Patient  expressed their understanding of the recommendation. Patient will be  scheduled for procedure at her earliest convenience by the  schedulers. Ordering provider will be notified. If applicable, a  reminder letter will be sent to the patient regarding the next  appointment.   BI-RADS CATEGORY  4: Suspicious.    Electronically Signed    By: Meda Klinefelter M.D.    On: 03/13/2024 11:07   Assessment:   Breast lesion [N64.9]- chronic, with signs of recurrent infection developing in the area.  Recommended excisional biopsy to relieve symptoms and minimize recurrence, even though initial biopsy noted benign tissue.  Plan:     1. Breast lesion [N64.9]  Discussed the risk of surgery including recurrence, chronic pain, post-op infxn, poor/delayed wound healing, poor cosmesis, seroma, hematoma formation, and possible re-operation to address said risks. The risks of general anesthetic, if used, includes MI, CVA, sudden death or even reaction to anesthetic medications also discussed.  Typical post-op recovery  time and possbility of activity restrictions were also discussed.  Alternatives include continued observation.  Benefits include possible symptom relief, pathologic evaluation, and/or curative excision.   The patient verbalized understanding and all questions were answered to the patient's satisfaction.  2. Patient has elected to proceed with surgical treatment. Procedure will be scheduled.  Left breast  labs/images/medications/previous chart entries reviewed personally and relevant changes/updates noted above.

## 2024-03-18 NOTE — H&P (Signed)
 Subjective:   CC: Breast lesion [N64.9] HPI:  Carl Burke is a 68 y.o. male who was referred by Marilynn Rail.,* for evaluation of above. First noted 67yrs ago.  Recurrent swelling deep to left nipple area.  New onset discharge noted from nipple recently, dx with mastitis and placed on abx.     Past Medical History:  has no past medical history on file.  Past Surgical History:  has no past surgical history on file.  Family History: two sisters with hx of breast CA  Social History:  reports that he has been smoking cigarettes. He has never used smokeless tobacco. He reports current alcohol use. Drug use questions deferred to the physician.  Current Medications: has a current medication list which includes the following prescription(s): albuterol mdi (proventil, ventolin, proair) hfa, amlodipine-benazepril, atorvastatin, tizanidine, trelegy ellipta, zolpidem, and zolpidem.  Allergies:  Allergies as of 03/18/2024 - Reviewed 03/18/2024  Allergen Reaction Noted   Naproxen Other (See Comments) and Rash 04/12/2016    ROS:  A 15 point review of systems was performed and was negative except as noted in HPI   Objective:     BP (!) 166/66   Pulse 70   Ht 180.3 cm (5\' 11" )   Wt (!) 130.2 kg (287 lb)   BMI 40.03 kg/m   Constitutional :  No distress, cooperative, alert  Lymphatics/Throat:  Supple with no lymphadenopathy  Respiratory:  Clear to auscultation bilaterally  Cardiovascular:  Regular rate and rhythm  Gastrointestinal: Soft, non-tender, non-distended, no organomegaly.  Musculoskeletal: Steady gait and movement  Skin: Cool and moist  Psychiatric: Normal affect, non-agitated, not confused  Breast: Normal appearance and no palpable abnormality in right breast and axilla.  Left breast swelling, induration, erythema, and bloody nipple discharge. Chaperone present for exam.      LABS:  CYTOLOGY - NON GYN CYTOLOGY - NON PAP [facility] [address] [place], [place] [address] Telephone [phone] or [phone] Fax [phone]  CYTOPATHOLOGY REPORT   Accession #: [id number] Patient Name: GRANT, SWAGER Visit # : [id number]  MRN: [id number] Physician: Mir, Mauri Reading DOB/Age September 16, 1956 (Age: 70) Gender: M Collected Date: 03/14/2024 Received Date: 03/14/2024  FINAL DIAGNOSIS STATEMENT Of SPECIMEN ADEQUACY:  INTERPRETATION(S):      - ACUTE INFLAMMATORY CELLS AND BENIGN SQUAMOUS CELLS.      DATE SIGNED OUT: 03/15/2024 ELECTRONIC SIGNATURE : [place] Md, Mark, Pathologist, Electronic Signature   CASE COMMENTS   CLINICAL HISTORY  SOURCE OF SPECIMEN(S) Breast, Cyst/Fluid  SPECIMEN COMMENTS: SPECIMEN CLINICAL INFORMATION: 1. Recurrent left breast fluid collections    Gross Description Specimen: Received is/are 12cc of red fluid in a sterile syringe      Prepared:      # Smears: 0      # Concentration Technique Slides (i.e. ThinPrep): 1      # Cell Block: 1      # Diff-Quick Stain: 0        Report signed out from the following location(s) [place] [address], [place] [address] CLIA #: [id number]  [facility] [address] [place], [place] [address] CLIA #: [id number]    SURGICAL PATHOLOGY SURGICAL PATHOLOGY [facility] [address] [place], [place] [address] Telephone [phone] or [phone] Fax [phone]  REPORT OF SURGICAL PATHOLOGY   Accession #: [id number] Patient Name: HRIDAY, STAI Visit # : [id number]  MRN: [id number] Physician: Mir,  Mauri Reading DOB/Age [birth year] (Age: 27) Gender: M Collected Date: 03/14/2024 Received Date: 03/14/2024  FINAL DIAGNOSIS       1. Breast, left, needle core biopsy, retroareolar, 12 o'clock (no clip) :      - BENIGN SKIN WITH MIXED INFLAMMATORY INFILTRATE AND HYPEREMIA.      - BENIGN DUCTS IN DEEP TISSUE.      - NEGATIVE FOR  MALIGNANCY.       DATE SIGNED OUT: 03/15/2024 ELECTRONIC SIGNATURE : [place] Md, Mark, Pathologist, Electronic Signature  MICROSCOPIC DESCRIPTION 1. Comment: The findings were conveyed to Randa Lynn via Secure Chat at 10:35 a.m. on 03/15/24.  CASE COMMENTS STAINS USED IN DIAGNOSIS: H&E-2 H&E-3 H&E-4 H&E    CLINICAL HISTORY  SPECIMEN(S) OBTAINED 1. Breast, left, needle core biopsy, Retroareolar, 12 O'clock (no Clip)  SPECIMEN COMMENTS: 1. TIF: 9:25 AM, CIT < 1 min; recurrent complex fluid collection in retroareolar left breast with recent development of greenish white nipple discharge; patient has a strong family history of breast cancer, positive in two sisters and maternal grandmother SPECIMEN CLINICAL INFORMATION: 1. Most likely recurrent abscess, malignancy considered less likely    Gross Description 1. Received in formalin, time in formalin 9:25 a.m. and CIT less than 1 minute, are fragments of tan to hemorrhagic soft tissue measuring 0.7 x 0.6 x 0.2 cm in aggregate.   The specimen is submitted in toto.  (GRP:kh 03/14/24)        Report signed out from the following location(s) [place] [address], [place] [address] CLIA #: [id number]  [facility] [address] [place], [place] [address] CLIA #: [id number]   Component Ref Range & Units (hover) 4 d ago  Specimen Description BREAST Performed at [facility], [address] [place], [place] [address]  Special Requests NONE Performed at [facility], [address] [place], [place] [address]  Gram Stain ABUNDANT WBC PRESENT, PREDOMINANTLY PMN MODERATE GRAM POSITIVE COCCI IN CHAINS Performed at [facility], [address] [place], [place] [address]  Culture ABUNDANT STAPHYLOCOCCUS LUGDUNENSIS NO ANAEROBES ISOLATED; CULTURE IN PROGRESS FOR 5 DAYS  Report Status PENDING  Organism ID, Bacteria STAPHYLOCOCCUS LUGDUNENSIS  Resulting  [facility]     Susceptibility   Staphylococcus lugdunensis    MIC    CIPROFLOXACIN <=0.5 SENSI... Sensitive    CLINDAMYCIN <=0.25 SENS... Sensitive    ERYTHROMYCIN <=0.25 SENS... Sensitive    GENTAMICIN <=0.5 SENSI... Sensitive    Inducible Clindamycin NEGATIVE Sensitive    OXACILLIN 1 SENSITIVE Sensitive    RIFAMPIN <=0.5 SENSI... Sensitive    TETRACYCLINE <=1 SENSITIVE Sensitive    TRIMETH/SULFA <=10 SENSIT... Sensitive    VANCOMYCIN 1 SENSITIVE Sensitive           Susceptibility Comments  Staphylococcus lugdunensis  ABUNDANT STAPHYLOCOCCUS LUGDUNENSIS       RADS: CLINICAL DATA:  LEFT breast palpable for a year and a half. Patient  can expressed milky fluid from this area and currently the fluid is  green in appearance.   EXAM:  DIGITAL DIAGNOSTIC BILATERAL MAMMOGRAM WITH TOMOSYNTHESIS AND CAD;  ULTRASOUND LEFT BREAST LIMITED   TECHNIQUE:  Bilateral digital diagnostic mammography and breast tomosynthesis  was performed. The images were evaluated with computer-aided  detection. ; Targeted ultrasound examination of the left breast was  performed.   COMPARISON: Previous exam(s).   ACR Breast Density Category b: There are scattered areas of  fibroglandular density.   FINDINGS:  Spot  compression tomosynthesis views were obtained of the site of  palpable concern in the LEFT breast. There is an oval mass noted  subjacent to the site of palpable concern in the LEFT retroareolar  breast. No discrete internal fat is definitively identified. No  suspicious mass, distortion, or microcalcifications are identified  to suggest presence of malignancy in the RIGHT breast.   On physical exam, there is a mass in the LEFT retroareolar breast.   Targeted ultrasound was performed of the site of palpable concern.  In the LEFT retroareolar breast at 12 o'clock, there is a oval  heterogeneously hypoechoic mass with mildly lobular margins. It  measures 30 x 34 x 29 mm.  This corresponds to the mass noted  mammographically.   Targeted ultrasound was performed of the LEFT axilla. No suspicious  LEFT axillary lymph nodes are visualized.   IMPRESSION:  1. There is a 34 mm complex possibly cystic mass in the LEFT  retroareolar breast. Recommend ultrasound-guided aspiration with  potential conversion to biopsy for definitive characterization.  Recommend any obtained fluid be sent for cytology as well as  microbiology given long-term persistence.  2. No suspicious LEFT axillary adenopathy.  3. No mammographic evidence of malignancy in the RIGHT breast.   RECOMMENDATION:  LEFT breast ultrasound-guided aspiration with potential conversion  to biopsy x1   I have discussed the findings and recommendations with the patient.  The aspiration with potential conversion to biopsy procedure was  discussed with the patient and questions were answered. Patient  expressed their understanding of the recommendation. Patient will be  scheduled for procedure at her earliest convenience by the  schedulers. Ordering provider will be notified. If applicable, a  reminder letter will be sent to the patient regarding the next  appointment.   BI-RADS CATEGORY  4: Suspicious.    Electronically Signed    By: Meda Klinefelter M.D.    On: 03/13/2024 11:07   Assessment:   Breast lesion [N64.9]- chronic, with signs of recurrent infection developing in the area.  Recommended excisional biopsy to relieve symptoms and minimize recurrence, even though initial biopsy noted benign tissue.  Plan:     1. Breast lesion [N64.9]  Discussed the risk of surgery including recurrence, chronic pain, post-op infxn, poor/delayed wound healing, poor cosmesis, seroma, hematoma formation, and possible re-operation to address said risks. The risks of general anesthetic, if used, includes MI, CVA, sudden death or even reaction to anesthetic medications also discussed.  Typical post-op recovery  time and possbility of activity restrictions were also discussed.  Alternatives include continued observation.  Benefits include possible symptom relief, pathologic evaluation, and/or curative excision.   The patient verbalized understanding and all questions were answered to the patient's satisfaction.  2. Patient has elected to proceed with surgical treatment. Procedure will be scheduled.  Left breast  labs/images/medications/previous chart entries reviewed personally and relevant changes/updates noted above.

## 2024-03-22 LAB — AEROBIC/ANAEROBIC CULTURE W GRAM STAIN (SURGICAL/DEEP WOUND)

## 2024-03-29 ENCOUNTER — Encounter
Admission: RE | Admit: 2024-03-29 | Discharge: 2024-03-29 | Disposition: A | Discharge status: Home / Self Care | Source: Ambulatory Visit | Attending: Surgery | Admitting: Surgery

## 2024-03-29 ENCOUNTER — Other Ambulatory Visit: Payer: Self-pay

## 2024-03-29 DIAGNOSIS — E785 Hyperlipidemia, unspecified: Secondary | ICD-10-CM | POA: Insufficient documentation

## 2024-03-29 DIAGNOSIS — Z01810 Encounter for preprocedural cardiovascular examination: Secondary | ICD-10-CM | POA: Diagnosis present

## 2024-03-29 DIAGNOSIS — E119 Type 2 diabetes mellitus without complications: Secondary | ICD-10-CM | POA: Diagnosis not present

## 2024-03-29 DIAGNOSIS — I10 Essential (primary) hypertension: Secondary | ICD-10-CM | POA: Insufficient documentation

## 2024-03-29 HISTORY — DX: Other specified disorders of breast: N64.89

## 2024-03-29 HISTORY — DX: Pneumonia, unspecified organism: J18.9

## 2024-03-29 HISTORY — DX: Other complications of anesthesia, initial encounter: T88.59XA

## 2024-03-29 HISTORY — DX: Unspecified asthma, uncomplicated: J45.909

## 2024-03-29 HISTORY — DX: Sleep apnea, unspecified: G47.30

## 2024-03-29 NOTE — Patient Instructions (Addendum)
 Your procedure is scheduled on: 04/04/24 - Thursday Report to the Registration Desk on the 1st floor of the [facility]. To find out your arrival time, please call [phone] between 1PM - 3PM on: 04/03/24 - Wednesday If your arrival time is 6:00 am, do not arrive before that time as the [facility] entrance doors do not open until 6:00 am.  REMEMBER: Instructions that are not followed completely may result in serious medical risk, up to and including death; or upon the discretion of your surgeon and anesthesiologist your surgery may need to be rescheduled.  Do not eat food after midnight the night before surgery.  No gum chewing or hard candies.  You may however, drink CLEAR liquids up to 2 hours before you are scheduled to arrive for your surgery. Do not drink anything within 2 hours of your scheduled arrival time.  Clear liquids include: - water     One week prior to surgery: Stop Anti-inflammatories (NSAIDS) such as Advil , Aleve, Ibuprofen , Motrin , Naproxen, Naprosyn and Aspirin based products such as Excedrin, Goody's Powder, BC Powder. You may continue taking Tylenol  if needed.  Stop ANY OVER THE COUNTER supplements until after surgery.  HOLD amLODipine -benazepril  on the day of surgery.  ON THE DAY OF SURGERY ONLY TAKE THESE MEDICATIONS WITH SIPS OF WATER :  tiZANidine  (ZANAFLEX ) if needed  Use inhalers TRELEGY ELLIPTA   on the day of surgery and bring to the hospital.   No Alcohol for 24 hours before or after surgery.  No Smoking including e-cigarettes for 24 hours before surgery.  No chewable tobacco products for at least 6 hours before surgery.  No nicotine patches on the day of surgery.  Do not use any "recreational" drugs for at least a week (preferably 2 weeks) before your surgery.  Please be advised that the combination of cocaine and anesthesia may have negative outcomes, up to and including death. If you test positive for cocaine, your surgery will be  cancelled.  On the morning of surgery brush your teeth with toothpaste and water , you may rinse your mouth with mouthwash if you wish. Do not swallow any toothpaste or mouthwash.  Use CHG Soap or wipes as directed on instruction sheet.  Do not wear jewelry, make-up, hairpins, clips or nail polish.  For welded (permanent) jewelry: bracelets, anklets, waist bands, etc.  Please have this removed prior to surgery.  If it is not removed, there is a chance that hospital personnel will need to cut it off on the day of surgery.  Do not wear lotions, powders, or perfumes.   Do not shave body hair from the neck down 48 hours before surgery.  Contact lenses, hearing aids and dentures may not be worn into surgery.  Do not bring valuables to the hospital. [facility] is not responsible for any missing/lost belongings or valuables.   Notify your doctor if there is any change in your medical condition (cold, fever, infection).  Wear comfortable clothing (specific to your surgery type) to the hospital.  After surgery, you can help prevent lung complications by doing breathing exercises.  Take deep breaths and cough every 1-2 hours. Your doctor may order a device called an Incentive Spirometer to help you take deep breaths.  When coughing or sneezing, hold a pillow firmly against your incision with both hands. This is called "splinting." Doing this helps protect your incision. It also decreases belly discomfort.  If you are being admitted to the hospital overnight, leave your suitcase in the car. After  surgery it may be brought to your room.  In case of increased patient census, it may be necessary for you, the patient, to continue your postoperative care in the Same Day Surgery department.  If you are being discharged the day of surgery, you will not be allowed to drive home. You will need a responsible individual to drive you home and stay with you for 24 hours after surgery.   If you are taking  public transportation, you will need to have a responsible individual with you.  Please call the Pre-admissions Testing Dept. at [phone] if you have any questions about these instructions.  Surgery Visitation Policy:  Patients having surgery or a procedure may have two visitors.  Children under the age of 70 must have an adult with them who is not the patient.  Inpatient Visitation:    Visiting hours are 7 a.m. to 8 p.m. Up to four visitors are allowed at one time in a patient room. The visitors may rotate out with other people during the day.  One visitor age 66 or older may stay with the patient overnight and must be in the room by 8 p.m.     Preparing for Surgery with CHLORHEXIDINE  GLUCONATE (CHG) Soap  Chlorhexidine  Gluconate (CHG) Soap  o An antiseptic cleaner that kills germs and bonds with the skin to continue killing germs even after washing  o Used for showering the night before surgery and morning of surgery  Before surgery, you can play an important role by reducing the number of germs on your skin.  CHG (Chlorhexidine  gluconate) soap is an antiseptic cleanser which kills germs and bonds with the skin to continue killing germs even after washing.  Please do not use if you have an allergy to CHG or antibacterial soaps. If your skin becomes reddened/irritated stop using the CHG.  1. Shower the NIGHT BEFORE SURGERY and the MORNING OF SURGERY with CHG soap.  2. If you choose to wash your hair, wash your hair first as usual with your normal shampoo.  3. After shampooing, rinse your hair and body thoroughly to remove the shampoo.  4. Use CHG as you would any other liquid soap. You can apply CHG directly to the skin and wash gently with a scrungie or a clean washcloth.  5. Apply the CHG soap to your body only from the neck down. Do not use on open wounds or open sores. Avoid contact with your eyes, ears, mouth, and genitals (private parts). Wash face and genitals  (private parts) with your normal soap.  6. Wash thoroughly, paying special attention to the area where your surgery will be performed.  7. Thoroughly rinse your body with warm water .  8. Do not shower/wash with your normal soap after using and rinsing off the CHG soap.  9. Pat yourself dry with a clean towel.  10. Wear clean pajamas to bed the night before surgery.  12. Place clean sheets on your bed the night of your first shower and do not sleep with pets.  13. Shower again with the CHG soap on the day of surgery prior to arriving at the hospital.  14. Do not apply any deodorants/lotions/powders.  15. Please wear clean clothes to the hospital.

## 2024-04-04 ENCOUNTER — Ambulatory Visit: Payer: Self-pay | Admitting: Urgent Care

## 2024-04-04 ENCOUNTER — Ambulatory Visit
Admission: RE | Admit: 2024-04-04 | Discharge: 2024-04-04 | Disposition: A | Discharge status: Home / Self Care | Source: Ambulatory Visit | Attending: Surgery | Admitting: Surgery

## 2024-04-04 ENCOUNTER — Other Ambulatory Visit: Payer: Self-pay

## 2024-04-04 ENCOUNTER — Encounter
Admission: RE | Disposition: A | Discharge status: Home / Self Care | Payer: Self-pay | Source: Ambulatory Visit | Attending: Surgery

## 2024-04-04 ENCOUNTER — Encounter: Payer: Self-pay | Admitting: Surgery

## 2024-04-04 DIAGNOSIS — N6002 Solitary cyst of left breast: Secondary | ICD-10-CM | POA: Diagnosis not present

## 2024-04-04 DIAGNOSIS — F17210 Nicotine dependence, cigarettes, uncomplicated: Secondary | ICD-10-CM | POA: Insufficient documentation

## 2024-04-04 DIAGNOSIS — E66813 Obesity, class 3: Secondary | ICD-10-CM | POA: Diagnosis not present

## 2024-04-04 DIAGNOSIS — G4730 Sleep apnea, unspecified: Secondary | ICD-10-CM | POA: Insufficient documentation

## 2024-04-04 DIAGNOSIS — I10 Essential (primary) hypertension: Secondary | ICD-10-CM | POA: Diagnosis not present

## 2024-04-04 DIAGNOSIS — J302 Other seasonal allergic rhinitis: Secondary | ICD-10-CM

## 2024-04-04 DIAGNOSIS — J410 Simple chronic bronchitis: Secondary | ICD-10-CM

## 2024-04-04 DIAGNOSIS — Z01810 Encounter for preprocedural cardiovascular examination: Secondary | ICD-10-CM

## 2024-04-04 DIAGNOSIS — E119 Type 2 diabetes mellitus without complications: Secondary | ICD-10-CM

## 2024-04-04 DIAGNOSIS — Z6841 Body Mass Index (BMI) 40.0 and over, adult: Secondary | ICD-10-CM | POA: Insufficient documentation

## 2024-04-04 DIAGNOSIS — N611 Abscess of the breast and nipple: Secondary | ICD-10-CM | POA: Diagnosis present

## 2024-04-04 DIAGNOSIS — Z79899 Other long term (current) drug therapy: Secondary | ICD-10-CM | POA: Diagnosis not present

## 2024-04-04 DIAGNOSIS — Z803 Family history of malignant neoplasm of breast: Secondary | ICD-10-CM | POA: Diagnosis not present

## 2024-04-04 DIAGNOSIS — N6342 Unspecified lump in left breast, subareolar: Secondary | ICD-10-CM

## 2024-04-04 DIAGNOSIS — G894 Chronic pain syndrome: Secondary | ICD-10-CM

## 2024-04-04 DIAGNOSIS — E785 Hyperlipidemia, unspecified: Secondary | ICD-10-CM

## 2024-04-04 DIAGNOSIS — Z7951 Long term (current) use of inhaled steroids: Secondary | ICD-10-CM | POA: Diagnosis not present

## 2024-04-04 DIAGNOSIS — F5104 Psychophysiologic insomnia: Secondary | ICD-10-CM

## 2024-04-04 HISTORY — PX: RE-EXCISION OF BREAST LUMPECTOMY: SHX6048

## 2024-04-04 LAB — GLUCOSE, CAPILLARY: Glucose-Capillary: 131 mg/dL — ABNORMAL HIGH (ref 70–99)

## 2024-04-04 SURGERY — EXCISION, LESION, BREAST
Anesthesia: General | Site: Breast | Laterality: Left

## 2024-04-04 MED ORDER — PROPOFOL 10 MG/ML IV BOLUS
INTRAVENOUS | Status: DC | PRN
  Administered 2024-04-04: 200 mg via INTRAVENOUS

## 2024-04-04 MED ORDER — GABAPENTIN 300 MG PO CAPS
300.0000 mg | ORAL_CAPSULE | ORAL | Status: AC
  Administered 2024-04-04: 300 mg via ORAL

## 2024-04-04 MED ORDER — LIDOCAINE HCL (PF) 1 % IJ SOLN
INTRAMUSCULAR | Status: AC
  Filled 2024-04-04: qty 30

## 2024-04-04 MED ORDER — STERILE WATER FOR IRRIGATION IR SOLN
Status: DC | PRN
  Administered 2024-04-04: 500 mL

## 2024-04-04 MED ORDER — FENTANYL CITRATE (PF) 100 MCG/2ML IJ SOLN
INTRAMUSCULAR | Status: AC
  Filled 2024-04-04: qty 2

## 2024-04-04 MED ORDER — DOCUSATE SODIUM 100 MG PO CAPS: 100.0000 mg | ORAL_CAPSULE | Freq: Two times a day (BID) | ORAL | 0 refills | Status: AC | PRN

## 2024-04-04 MED ORDER — LIDOCAINE HCL 1 % IJ SOLN
INTRAMUSCULAR | Status: DC | PRN
  Administered 2024-04-04: 20 mL

## 2024-04-04 MED ORDER — KETOROLAC TROMETHAMINE 30 MG/ML IJ SOLN
INTRAMUSCULAR | Status: AC
  Filled 2024-04-04: qty 1

## 2024-04-04 MED ORDER — DEXMEDETOMIDINE HCL IN NACL 80 MCG/20ML IV SOLN
INTRAVENOUS | Status: DC | PRN
  Administered 2024-04-04 (×2): 8 ug via INTRAVENOUS

## 2024-04-04 MED ORDER — TIZANIDINE HCL 4 MG PO TABS: 4.0000 mg | ORAL_TABLET | Freq: Three times a day (TID) | ORAL | Status: DC | PRN

## 2024-04-04 MED ORDER — ONDANSETRON HCL 4 MG/2ML IJ SOLN
INTRAMUSCULAR | Status: DC | PRN
  Administered 2024-04-04: 4 mg via INTRAVENOUS

## 2024-04-04 MED ORDER — OXYCODONE HCL 5 MG PO TABS: 5.0000 mg | ORAL_TABLET | Freq: Three times a day (TID) | ORAL | 0 refills | Status: DC | PRN

## 2024-04-04 MED ORDER — FENTANYL CITRATE (PF) 100 MCG/2ML IJ SOLN
INTRAMUSCULAR | Status: DC | PRN
Start: 2024-04-04 — End: 2024-04-04
  Administered 2024-04-04 (×2): 50 ug via INTRAVENOUS

## 2024-04-04 MED ORDER — AZELASTINE HCL 0.1 % NA SOLN: 1.0000 | Freq: Two times a day (BID) | NASAL | Status: AC | PRN

## 2024-04-04 MED ORDER — ZOLPIDEM TARTRATE 10 MG PO TABS: 10.0000 mg | ORAL_TABLET | Freq: Every evening | ORAL | Status: DC | PRN

## 2024-04-04 MED ORDER — ACETAMINOPHEN 10 MG/ML IV SOLN
INTRAVENOUS | Status: DC | PRN
  Administered 2024-04-04: 1000 mg via INTRAVENOUS

## 2024-04-04 MED ORDER — CHLORHEXIDINE GLUCONATE CLOTH 2 % EX PADS
6.0000 | MEDICATED_PAD | Freq: Once | CUTANEOUS | Status: AC
  Administered 2024-04-04: 6 via TOPICAL

## 2024-04-04 MED ORDER — MIDAZOLAM HCL 2 MG/2ML IJ SOLN
INTRAMUSCULAR | Status: AC
  Filled 2024-04-04: qty 2

## 2024-04-04 MED ORDER — ACETAMINOPHEN 10 MG/ML IV SOLN
INTRAVENOUS | Status: AC
  Filled 2024-04-04: qty 100

## 2024-04-04 MED ORDER — BUPIVACAINE-EPINEPHRINE (PF) 0.5% -1:200000 IJ SOLN
INTRAMUSCULAR | Status: AC
  Filled 2024-04-04: qty 30

## 2024-04-04 MED ORDER — MIDAZOLAM HCL 2 MG/2ML IJ SOLN
INTRAMUSCULAR | Status: DC | PRN
Start: 2024-04-04 — End: 2024-04-04
  Administered 2024-04-04: 2 mg via INTRAVENOUS

## 2024-04-04 MED ORDER — DEXAMETHASONE SODIUM PHOSPHATE 10 MG/ML IJ SOLN
INTRAMUSCULAR | Status: AC
  Filled 2024-04-04: qty 1

## 2024-04-04 MED ORDER — DROPERIDOL 2.5 MG/ML IJ SOLN: 0.6250 mg | Freq: Once | INTRAMUSCULAR | Status: DC | PRN

## 2024-04-04 MED ORDER — CEFAZOLIN SODIUM-DEXTROSE 2-4 GM/100ML-% IV SOLN
2.0000 g | INTRAVENOUS | Status: AC
  Administered 2024-04-04: 2 g via INTRAVENOUS

## 2024-04-04 MED ORDER — PROPOFOL 10 MG/ML IV BOLUS
INTRAVENOUS | Status: AC
  Filled 2024-04-04: qty 20

## 2024-04-04 MED ORDER — CHLORHEXIDINE GLUCONATE 0.12 % MT SOLN
OROMUCOSAL | Status: AC
  Filled 2024-04-04: qty 15

## 2024-04-04 MED ORDER — LIDOCAINE HCL (PF) 2 % IJ SOLN
INTRAMUSCULAR | Status: AC
  Filled 2024-04-04: qty 5

## 2024-04-04 MED ORDER — OXYCODONE HCL 5 MG/5ML PO SOLN: 5.0000 mg | Freq: Once | ORAL | Status: DC | PRN

## 2024-04-04 MED ORDER — GABAPENTIN 300 MG PO CAPS
ORAL_CAPSULE | ORAL | Status: AC
  Filled 2024-04-04: qty 1

## 2024-04-04 MED ORDER — DEXMEDETOMIDINE HCL IN NACL 80 MCG/20ML IV SOLN
INTRAVENOUS | Status: AC
  Filled 2024-04-04: qty 20

## 2024-04-04 MED ORDER — PROPOFOL 1000 MG/100ML IV EMUL
INTRAVENOUS | Status: AC
  Filled 2024-04-04: qty 100

## 2024-04-04 MED ORDER — OXYCODONE HCL 5 MG PO TABS: 5.0000 mg | ORAL_TABLET | Freq: Once | ORAL | Status: DC | PRN

## 2024-04-04 MED ORDER — CEFAZOLIN SODIUM-DEXTROSE 2-4 GM/100ML-% IV SOLN
INTRAVENOUS | Status: AC
  Filled 2024-04-04: qty 100

## 2024-04-04 MED ORDER — ACETAMINOPHEN 10 MG/ML IV SOLN: 1000.0000 mg | Freq: Once | INTRAVENOUS | Status: DC | PRN

## 2024-04-04 MED ORDER — CHLORHEXIDINE GLUCONATE 0.12 % MT SOLN
15.0000 mL | Freq: Once | OROMUCOSAL | Status: AC
  Administered 2024-04-04: 15 mL via OROMUCOSAL

## 2024-04-04 MED ORDER — FENTANYL CITRATE (PF) 100 MCG/2ML IJ SOLN: 25.0000 ug | INTRAMUSCULAR | Status: DC | PRN

## 2024-04-04 MED ORDER — ONDANSETRON HCL 4 MG/2ML IJ SOLN
INTRAMUSCULAR | Status: AC
  Filled 2024-04-04: qty 2

## 2024-04-04 MED ORDER — LORATADINE 10 MG PO TABS: 10.0000 mg | ORAL_TABLET | Freq: Every day | ORAL | Status: AC | PRN

## 2024-04-04 MED ORDER — LACTATED RINGERS IV SOLN: INTRAVENOUS | Status: DC

## 2024-04-04 MED ORDER — DEXAMETHASONE SODIUM PHOSPHATE 10 MG/ML IJ SOLN
INTRAMUSCULAR | Status: DC | PRN
  Administered 2024-04-04: 4 mg via INTRAVENOUS

## 2024-04-04 MED ORDER — ORAL CARE MOUTH RINSE: 15.0000 mL | Freq: Once | OROMUCOSAL | Status: AC

## 2024-04-04 MED ORDER — LIDOCAINE HCL (PF) 2 % IJ SOLN
INTRAMUSCULAR | Status: DC | PRN
  Administered 2024-04-04: 60 mg via INTRADERMAL

## 2024-04-04 SURGICAL SUPPLY — 28 items
BLADE PHOTON ILLUMINATED (MISCELLANEOUS) ×1 IMPLANT
BLADE SURG 15 STRL LF DISP TIS (BLADE) ×1 IMPLANT
CHLORAPREP W/TINT 26 (MISCELLANEOUS) IMPLANT
DERMABOND ADVANCED .7 DNX12 (GAUZE/BANDAGES/DRESSINGS) ×1 IMPLANT
DEVICE DUBIN SPECIMEN MAMMOGRA (MISCELLANEOUS) ×1 IMPLANT
DRAPE LAPAROTOMY 77X122 PED (DRAPES) ×1 IMPLANT
ELECTRODE REM PT RTRN 9FT ADLT (ELECTROSURGICAL) ×1 IMPLANT
GLOVE BIOGEL PI IND STRL 7.0 (GLOVE) ×1 IMPLANT
GLOVE SURG SYN 6.5 ES PF (GLOVE) ×1 IMPLANT
GLOVE SURG SYN 6.5 PF PI (GLOVE) ×1 IMPLANT
GOWN STRL REUS W/ TWL LRG LVL3 (GOWN DISPOSABLE) ×2 IMPLANT
KIT MARKER MARGIN INK (KITS) ×1 IMPLANT
KIT TURNOVER KIT A (KITS) ×1 IMPLANT
LABEL OR SOLS (LABEL) ×1 IMPLANT
LIGHT WAVEGUIDE WIDE FLAT (MISCELLANEOUS) IMPLANT
MANIFOLD NEPTUNE II (INSTRUMENTS) ×1 IMPLANT
MARKER MARGIN CORRECT CLIP (MARKER) ×1 IMPLANT
NDL HYPO 22X1.5 SAFETY MO (MISCELLANEOUS) ×1 IMPLANT
NEEDLE HYPO 22X1.5 SAFETY MO (MISCELLANEOUS) ×1 IMPLANT
PACK BASIN MINOR ARMC (MISCELLANEOUS) ×1 IMPLANT
SUT SILK 3 0 12 30 (SUTURE) IMPLANT
SUT VIC AB 3-0 SH 27X BRD (SUTURE) ×1 IMPLANT
SUTURE MNCRL 4-0 27XMF (SUTURE) ×1 IMPLANT
SYR 20ML LL LF (SYRINGE) ×1 IMPLANT
SYR BULB IRRIG 60ML STRL (SYRINGE) ×1 IMPLANT
TRAP FLUID SMOKE EVACUATOR (MISCELLANEOUS) ×1 IMPLANT
TRAP NEPTUNE SPECIMEN COLLECT (MISCELLANEOUS) ×1 IMPLANT
WATER STERILE IRR 500ML POUR (IV SOLUTION) ×1 IMPLANT

## 2024-04-04 NOTE — Transfer of Care (Signed)
 Immediate Anesthesia Transfer of Care Note  Patient: Carl Burke  Procedure(s) Performed: EXCISION, LESION, BREAST (Left: Breast)  Patient Location: PACU  Anesthesia Type:General  Level of Consciousness: awake, alert , oriented, and patient cooperative  Airway & Oxygen Therapy: Patient Spontanous Breathing and Patient connected to face mask oxygen  Post-op Assessment: Report given to RN, Post -op Vital signs reviewed and stable, and Patient moving all extremities X 4  Post vital signs: Reviewed and stable  Last Vitals:  Vitals Value Taken Time  BP 162/75 04/04/24 0820  Temp 36.2 C 04/04/24 0820  Pulse 86 04/04/24 0822  Resp 18 04/04/24 0822  SpO2 95 % 04/04/24 0822  Vitals shown include unfiled device data.  Last Pain:  Vitals:   04/04/24 0820  PainSc: 0-No pain     Pt transported to PACU with circ RN and report given to PACU RN. Patent airway to PACU, breathing spontaneously on 6L O2 via FM. Pt awake, responsive and comfortable. VSS.    Complications: No notable events documented.

## 2024-04-04 NOTE — Anesthesia Preprocedure Evaluation (Addendum)
 Anesthesia Evaluation  Patient identified by MRN, date of birth, ID band Patient awake    Reviewed: Allergy & Precautions, H&P , NPO status , Patient's Chart, lab work & pertinent test results  History of Anesthesia Complications (+) Emergence Delirium and history of anesthetic complications  Airway Mallampati: II  TM Distance: >3 FB Neck ROM: full    Dental  (+) Edentulous Upper, Missing,    Pulmonary sleep apnea , Current Smoker and Patient abstained from smoking.   Pulmonary exam normal        Cardiovascular Exercise Tolerance: Poor hypertension, (-) angina (-) Orthopnea Normal cardiovascular exam     Neuro/Psych  PSYCHIATRIC DISORDERS       Neuromuscular disease (bilateral sciatica)    GI/Hepatic Neg liver ROS,,,H/o duodenal bleed in the distant past   Endo/Other  negative endocrine ROS  Class 3 obesity  Renal/GU      Musculoskeletal   Abdominal  (+) + obese  Peds  Hematology negative hematology ROS (+)   Anesthesia Other Findings Past Medical History: 07/17/2016: Acute blood loss anemia 07/17/2016: Acute duodenal ulcer with bleeding No date: Arthritis No date: Asthma No date: Colon cancer screening No date: Complex sclerosing lesion of left breast No date: Complication of anesthesia     Comment:  agitated and combative 07/18/2023: Diabetes mellitus type 2, diet-controlled (HCC) No date: Duodenal ulcer due to bacteria No date: Helicobacter pylori gastritis     Comment:  Rx 1) PPI pantoprazole  40 mg qd x 2 mos 2) Pepto Bismol               2 tabs (262 mg each) 4 times a day x 14 d 3)               Metronidazole  250 mg 4 times a day x 14 d 4) doxycycline                100 mg 2 times a day x 14 d   In 4 weeks after treatment               completed do H. Pylori stool antigen - dx H. Pylori               gastritis  No date: Hyperlipidemia No date: Hypertension 04/12/2016: Long term prescription opiate  use 04/12/2016: Lumbosacral radiculopathy at S1 (Left) 04/12/2016: Opiate use 04/12/2016: Opiate use (300 MME/Day)     Comment:  Fentanyl  patch 100 g per hour every 72 hours +               oxycodone /APAP 10/325 one every 6 hours (40 mg/day).  No date: Pneumonia 01/19/2021: Seasonal affective disorder (HCC) No date: Sleep apnea (no nocturnal PAP therapy)  Past Surgical History: 03/14/2024: BREAST BIOPSY; Left     Comment:  US  LT BREAST BX W LOC DEV 1ST LESION IMG BX SPEC US                GUIDE 03/14/2024 [facility] No date: COLONOSCOPY 08/23/2018: COLONOSCOPY WITH PROPOFOL ; N/A     Comment:  Procedure: COLONOSCOPY WITH PROPOFOL ;  Surgeon: Selena Daily, MD;  Location: [facility] ENDOSCOPY;  Service:               Gastroenterology;  Laterality: N/A; 08/24/2023: COLONOSCOPY WITH PROPOFOL ; N/A     Comment:  Procedure: COLONOSCOPY WITH PROPOFOL ;  Surgeon: Baldomero Bone,  Elson Halon, MD;  Location: [facility] ENDOSCOPY;  Service:               Gastroenterology;  Laterality: N/A; 07/19/2016: ESOPHAGOGASTRODUODENOSCOPY; N/A     Comment:  Procedure: ESOPHAGOGASTRODUODENOSCOPY (EGD);  Surgeon:               Kenney Peacemaker, MD;  Location[facility] ENDOSCOPY;  Service:               Endoscopy;  Laterality: N/A; 08/23/2018: ESOPHAGOGASTRODUODENOSCOPY (EGD) WITH PROPOFOL ; N/A     Comment:  Procedure: ESOPHAGOGASTRODUODENOSCOPY (EGD) WITH               PROPOFOL ;  Surgeon: Selena Daily, MD;  Location:               [facility] ENDOSCOPY;  Service: Gastroenterology;  Laterality:               N/A; 08/24/2023: POLYPECTOMY     Comment:  Procedure: POLYPECTOMY;  Surgeon: Selena Daily,               MD;  Location: [facility] ENDOSCOPY;  Service:               Gastroenterology;; 2011: ROTATOR CUFF REPAIR; Left 2013: ROTATOR CUFF REPAIR; Right  BMI    Body Mass Index: 40.03 kg/m      Reproductive/Obstetrics negative OB ROS                               Anesthesia Physical Anesthesia Plan  ASA: 3  Anesthesia Plan: General LMA   Post-op Pain Management: Toradol  IV (intra-op)*, Ofirmev  IV (intra-op)* and Regional block*   Induction: Intravenous  PONV Risk Score and Plan: Dexamethasone , Ondansetron  and Midazolam   Airway Management Planned: LMA  Additional Equipment:   Intra-op Plan:   Post-operative Plan: Extubation in OR  Informed Consent: I have reviewed the patients History and Physical, chart, labs and discussed the procedure including the risks, benefits and alternatives for the proposed anesthesia with the patient or authorized representative who has indicated his/her understanding and acceptance.     Dental Advisory Given  Plan Discussed with: Anesthesiologist, CRNA and Surgeon  Anesthesia Plan Comments:          Anesthesia Quick Evaluation

## 2024-04-04 NOTE — Interval H&P Note (Signed)
 History and Physical Interval Note:  04/04/2024 7:11 AM  [name]  has presented today for surgery, with the diagnosis of N64.9  Left breast lesion.  The various methods of treatment have been discussed with the patient and family. After consideration of risks, benefits and other options for treatment, the patient has consented to  Procedure(s): EXCISION, LESION, BREAST (Left) as a surgical intervention.  The patient's history has been reviewed, patient examined, no change in status, stable for surgery.  I have reviewed the patient's chart and labs.  Questions were answered to the patient's satisfaction.     Carl Burke

## 2024-04-04 NOTE — Anesthesia Postprocedure Evaluation (Signed)
 Anesthesia Post Note  Patient: Sammie Crigler Barresi  Procedure(s) Performed: EXCISION, LESION, BREAST (Left: Breast)  Patient location during evaluation: PACU Anesthesia Type: General Level of consciousness: awake and alert Pain management: pain level controlled Vital Signs Assessment: post-procedure vital signs reviewed and stable Respiratory status: spontaneous breathing, nonlabored ventilation and respiratory function stable Cardiovascular status: blood pressure returned to baseline and stable Postop Assessment: no apparent nausea or vomiting Anesthetic complications: no   No notable events documented.   Last Vitals:  Vitals:   04/04/24 0845 04/04/24 0900  BP: 132/63 (!) 149/74  Pulse: 80 77  Resp: 16 17  Temp: (!) 36.1 C 36.4 C  SpO2: 96% 95%    Last Pain:  Vitals:   04/04/24 0900  TempSrc: Temporal  PainSc: 3                  Baltazar Bonier

## 2024-04-04 NOTE — Discharge Instructions (Signed)
 Removal, Care After This sheet gives you information about how to care for yourself after your procedure. Your health care provider may also give you more specific instructions. If you have problems or questions, contact your health care provider. What can I expect after the procedure? After the procedure, it is common to have: Soreness. Bruising. Itching. Follow these instructions at home: site care Follow instructions from your health care provider about how to take care of your site. Make sure you: Wash your hands with soap and water before and after you change your bandage (dressing). If soap and water are not available, use hand sanitizer. Leave stitches (sutures), skin glue, or adhesive strips in place. These skin closures may need to stay in place for 2 weeks or longer. If adhesive strip edges start to loosen and curl up, you may trim the loose edges. Do not remove adhesive strips completely unless your health care provider tells you to do that. If the area bleeds or bruises, apply gentle pressure for 10 minutes. OK TO SHOWER IN 24HRS  Check your site every day for signs of infection. Check for: Redness, swelling, or pain. Fluid or blood. Warmth. Pus or a bad smell.  General instructions Rest and then return to your normal activities as told by your health care provider.  tylenol and advil as needed for discomfort.  Please alternate between the two every four hours as needed for pain.    Use narcotics, if prescribed, only when tylenol and motrin is not enough to control pain.  325-650mg  every 8hrs to max of 3000mg /24hrs (including the 325mg  in every norco dose) for the tylenol.    Advil up to 800mg  per dose every 8hrs as needed for pain.   Keep all follow-up visits as told by your health care provider. This is important. Contact a health care provider if: You have redness, swelling, or pain around your site. You have fluid or blood coming from your site. Your site feels warm to  the touch. You have pus or a bad smell coming from your site. You have a fever. Your sutures, skin glue, or adhesive strips loosen or come off sooner than expected. Get help right away if: You have bleeding that does not stop with pressure or a dressing. Summary After the procedure, it is common to have some soreness, bruising, and itching at the site. Follow instructions from your health care provider about how to take care of your site. Check your site every day for signs of infection. Contact a health care provider if you have redness, swelling, or pain around your site, or your site feels warm to the touch. Keep all follow-up visits as told by your health care provider. This is important. This information is not intended to replace advice given to you by your health care provider. Make sure you discuss any questions you have with your health care provider. Document Released: 12/25/2015 Document Revised: 05/28/2018 Document Reviewed: 05/28/2018 Elsevier Interactive Patient Education  [employer].

## 2024-04-04 NOTE — Op Note (Signed)
 Preoperative diagnosis: Left breast mass  Postoperative diagnosis: Same.   Procedure: Left breast lumpectomy Anesthesia: GETA  Surgeon: Dr. Conrado Delay  Wound Classification: Clean  Indications: Patient is a 68 y.o. male with a symptomatic and palpable left breast mass.  Here for excision due to symptomatology  Specimen: Left breast mass Complications: None  Estimated Blood Loss: 10 mL  Findings: 1.  Palpable cystlike structure in subareolar region of concern.  Description of procedure: SCOUT localization was performed by radiology prior to procedure. The patient was taken to the operating room and placed supine on the operating table, and after general anesthesia the left breast and axilla were prepped and draped in the usual sterile fashion. A time-out was completed verifying correct patient, procedure, site, positioning, and implant(s) and/or special equipment prior to beginning this procedure.  The skin incision was made after infusion of local. Flaps were raised and  Sharp and blunt dissection was then taken down to the mass, which actually noted to be more like a cystlike structure with brown-colored fluid within it.  Extra care noted to leave as much tissue below the areola as possible to maintain vasculature.  Taking care to include the entire palpable abnormality, The specimen was removed. The specimen was oriented with paint, passed off field pending pathology.    wound irrigated, hemostasis achieved and the wound closed in layers with  interrupted sutures of 3-0 Vicryl in deep dermal layer and a running subcuticular suture of Monocryl 4-0, then dressed with dermabond.  The patient tolerated the procedure well and was taken to the postanesthesia care unit in stable condition. Sponge and instrument count correct at end of procedure.

## 2024-04-04 NOTE — Anesthesia Procedure Notes (Signed)
 Procedure Name: LMA Insertion Date/Time: 04/04/2024 7:35 AM  Performed by: Sherrlyn Dolores, CRNAPre-anesthesia Checklist: Patient identified, Patient being monitored, Timeout performed, Emergency Drugs available and Suction available Patient Re-evaluated:Patient Re-evaluated prior to induction Oxygen Delivery Method: Circle system utilized Preoxygenation: Pre-oxygenation with 100% oxygen Induction Type: IV induction Ventilation: Mask ventilation without difficulty LMA: LMA inserted LMA Size: 5.0 Tube type: Oral Number of attempts: 1 Placement Confirmation: positive ETCO2 and breath sounds checked- equal and bilateral Tube secured with: Tape Dental Injury: Teeth and Oropharynx as per pre-operative assessment  Comments: Atraumatic LMA placement x1 attempt.

## 2024-04-05 ENCOUNTER — Encounter: Payer: Self-pay | Admitting: Surgery

## 2024-04-05 LAB — SURGICAL PATHOLOGY

## 2024-04-23 ENCOUNTER — Ambulatory Visit (INDEPENDENT_AMBULATORY_CARE_PROVIDER_SITE_OTHER): Payer: Self-pay | Admitting: Family Medicine

## 2024-04-23 ENCOUNTER — Encounter: Payer: Self-pay | Admitting: Family Medicine

## 2024-04-23 VITALS — BP 130/74 | HR 83 | Resp 16 | Ht 71.0 in | Wt 286.2 lb

## 2024-04-23 DIAGNOSIS — E559 Vitamin D deficiency, unspecified: Secondary | ICD-10-CM

## 2024-04-23 DIAGNOSIS — J410 Simple chronic bronchitis: Secondary | ICD-10-CM | POA: Diagnosis not present

## 2024-04-23 DIAGNOSIS — G894 Chronic pain syndrome: Secondary | ICD-10-CM

## 2024-04-23 DIAGNOSIS — F1021 Alcohol dependence, in remission: Secondary | ICD-10-CM | POA: Diagnosis not present

## 2024-04-23 DIAGNOSIS — F338 Other recurrent depressive disorders: Secondary | ICD-10-CM

## 2024-04-23 DIAGNOSIS — E785 Hyperlipidemia, unspecified: Secondary | ICD-10-CM | POA: Diagnosis not present

## 2024-04-23 DIAGNOSIS — Z9889 Other specified postprocedural states: Secondary | ICD-10-CM

## 2024-04-23 DIAGNOSIS — E1169 Type 2 diabetes mellitus with other specified complication: Secondary | ICD-10-CM | POA: Diagnosis not present

## 2024-04-23 DIAGNOSIS — I10 Essential (primary) hypertension: Secondary | ICD-10-CM

## 2024-04-23 DIAGNOSIS — F5104 Psychophysiologic insomnia: Secondary | ICD-10-CM

## 2024-04-23 DIAGNOSIS — E538 Deficiency of other specified B group vitamins: Secondary | ICD-10-CM

## 2024-04-23 LAB — POCT GLYCOSYLATED HEMOGLOBIN (HGB A1C): Hemoglobin A1C: 6.7 % — AB (ref 4.0–5.6)

## 2024-04-23 MED ORDER — AMLODIPINE BESY-BENAZEPRIL HCL 5-20 MG PO CAPS: 1.0000 | ORAL_CAPSULE | Freq: Every day | ORAL | 1 refills | Status: DC

## 2024-04-23 MED ORDER — TIZANIDINE HCL 4 MG PO TABS: 4.0000 mg | ORAL_TABLET | Freq: Four times a day (QID) | ORAL | 0 refills | Status: DC | PRN

## 2024-04-23 MED ORDER — ATORVASTATIN CALCIUM 40 MG PO TABS: 40.0000 mg | ORAL_TABLET | Freq: Every day | ORAL | 1 refills | Status: DC

## 2024-04-23 MED ORDER — ZOLPIDEM TARTRATE 10 MG PO TABS
10.0000 mg | ORAL_TABLET | Freq: Every evening | ORAL | 0 refills | Status: DC | PRN
Start: 2024-04-23 — End: 2024-08-14

## 2024-04-23 NOTE — Progress Notes (Signed)
 Name: Carl Burke   MRN: [id number]    DOB: 1956/04/21   Date:04/23/2024       Progress Note  Subjective  Chief Complaint  Chief Complaint  Patient presents with   Medical Management of Chronic Issues   Discussed the use of AI scribe software for clinical note transcription with the patient, who gave verbal consent to proceed.  History of Present Illness Carl Sporn "Will" is a 68 year old male who presents with postoperative breast pain and concerns about incision healing, and also his regular follow up  He underwent breast surgery on April 04, 2024, for a lesion associated with nipple discharge and mastitis that did not resolve with antibiotics. Post-surgery, he experiences significant pain at the incision site, which is painful even without rubbing. He had a follow-up on Apr 17, 2024, but continues to experience discomfort and is concerned about potential infection. New redness at the incision site is noted, but there is no pus or induration.  He has diabetes, with his A1c recently trending up to 6.7. No significant changes in hunger or thirst are reported. He is on diet only for diabetes control. For associated HTN he takes lotrel and for dyslipidemia takes Atorvastatin  and no side effects   He has chronic bronchitis, which he attributes to past smoking habits. He experiences a wet cough with phlegm, particularly in the mornings, and uses Trelegy as needed. He occasionally smokes cigars and avoids marijuana due to severe respiratory reactions.  He takes atorvastatin  for dyslipidemia and reports adherence to his medication regimen. He also takes tizanidine  for muscle relaxation and zolpidem  10 mg for sleep, which he adjusts slightly to avoid hangovers.  He has a history of seasonal affective disorder, which worsens in the winter. He reports feeling emotionally stable currently, though he acknowledges some depression related to chronic pain and physical limitations.  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 He expresses reluctance to receive further vaccinations.   Morbid obesity , BMI over 35 with associated DM , HTN and dyslipidemia, he is trying to cut down on portion size and eating healthier  Patient Active Problem List   Diagnosis Date Noted   Dyslipidemia associated with type 2 diabetes mellitus (HCC) 04/23/2024   History of colonic polyps 08/24/2023   Family history of colon cancer in father 08/24/2023   Polyp of ascending colon 08/24/2023   Polyp of sigmoid colon 08/24/2023   Diabetes mellitus type 2, diet-controlled (HCC) 07/18/2023   B12 deficiency 07/18/2023   Chronic insomnia 07/18/2023   Vitamin D  deficiency 07/18/2023   Chronic pain syndrome 07/18/2023   Simple chronic bronchitis (HCC) 01/11/2023   Morbid obesity (HCC) 01/11/2023   Senile purpura (HCC) 04/04/2022   History of alcoholism (HCC) 04/04/2022   Seasonal affective disorder (HCC) 01/19/2021   Dyslipidemia 10/31/2019   Umbilical hernia without obstruction and without gangrene 07/17/2019   Degeneration of lumbosacral intervertebral disc 03/28/2018   Full thickness rotator cuff tear 03/28/2018   Neck pain 03/28/2018   Leukocytosis 07/17/2016   Chronic shoulder pain (Right) 04/12/2016   Cervical spondylosis 04/12/2016   Chronic upper back pain (Location of Secondary source of pain) (Bilateral) (midline) 04/12/2016   Essential hypertension 04/12/2016   Lumbosacral radiculopathy at S1 (Left) 04/12/2016   Rotator cuff arthropathy of right shoulder 08/15/2013    Past Surgical History:  Procedure Laterality Date   BREAST BIOPSY Left 03/14/2024  US  LT BREAST BX W LOC DEV 1ST LESION IMG BX SPEC US  GUIDE 03/14/2024 [facility]   COLONOSCOPY     COLONOSCOPY WITH PROPOFOL  N/A 08/23/2018   Procedure: COLONOSCOPY WITH PROPOFOL ;  Surgeon: Selena Daily, MD;  Location:  [facility] ENDOSCOPY;  Service: Gastroenterology;  Laterality: N/A;   COLONOSCOPY WITH PROPOFOL  N/A 08/24/2023   Procedure: COLONOSCOPY WITH PROPOFOL ;  Surgeon: Selena Daily, MD;  Location: [facility] ENDOSCOPY;  Service: Gastroenterology;  Laterality: N/A;   ESOPHAGOGASTRODUODENOSCOPY N/A 07/19/2016   Procedure: ESOPHAGOGASTRODUODENOSCOPY (EGD);  Surgeon: Kenney Peacemaker, MD;  Location: [facility] ENDOSCOPY;  Service: Endoscopy;  Laterality: N/A;   ESOPHAGOGASTRODUODENOSCOPY (EGD) WITH PROPOFOL  N/A 08/23/2018   Procedure: ESOPHAGOGASTRODUODENOSCOPY (EGD) WITH PROPOFOL ;  Surgeon: Selena Daily, MD;  Location: [facility] ENDOSCOPY;  Service: Gastroenterology;  Laterality: N/A;   POLYPECTOMY  08/24/2023   Procedure: POLYPECTOMY;  Surgeon: Selena Daily, MD;  Location: [facility] ENDOSCOPY;  Service: Gastroenterology;;   RE-EXCISION OF BREAST LUMPECTOMY Left 04/04/2024   Procedure: EXCISION, LESION, BREAST;  Surgeon: Conrado Delay, DO;  Location: [facility] ORS;  Service: General;  Laterality: Left;   ROTATOR CUFF REPAIR Left 2011   ROTATOR CUFF REPAIR Right 2013    Family History  Problem Relation Age of Onset   Heart disease Mother    Colon cancer Father    Cancer Sister    Breast cancer Sister 57   Cancer Sister     Social History   Tobacco Use   Smoking status: Some Days    Current packs/day: 0.00    Average packs/day: 0.5 packs/day for 51.4 years (25.7 ttl pk-yrs)    Types: Cigarettes    Start date: 09/30/1970    Last attempt to quit: 03/12/2022    Years since quitting: 2.1   Smokeless tobacco: Never  Substance Use Topics   Alcohol use: Yes    Alcohol/week: 0.0 standard drinks of alcohol    Comment: Rare/occassional      Current Outpatient Medications:    acetaminophen  (TYLENOL ) 500 MG tablet, Take 1,000 mg by mouth every 8 (eight) hours as needed for moderate pain (pain score 4-6)., Disp: , Rfl:    amLODipine -benazepril  (LOTREL) 5-20 MG capsule, Take 1 capsule by mouth daily., Disp: 90 capsule,  Rfl: 1   atorvastatin  (LIPITOR) 40 MG tablet, Take 1 tablet (40 mg total) by mouth daily., Disp: 90 tablet, Rfl: 1   azelastine  (ASTELIN ) 0.1 % nasal spray, Place 1 spray into both nostrils 2 (two) times daily as needed for allergies or rhinitis. Use in each nostril as directed, Disp: , Rfl:    Fluticasone -Umeclidin-Vilant (TRELEGY ELLIPTA ) 100-62.5-25 MCG/ACT AEPB, Inhale 1 puff into the lungs daily. (Patient taking differently: Inhale 1 puff into the lungs daily as needed (asthma).), Disp: 1 each, Rfl: 5   ibuprofen  (ADVIL ) 200 MG tablet, Take 400 mg by mouth every 8 (eight) hours as needed for moderate pain (pain score 4-6)., Disp: , Rfl:    loratadine  (CLARITIN ) 10 MG tablet, Take 1 tablet (10 mg total) by mouth daily as needed for allergies., Disp: , Rfl:    tiZANidine  (ZANAFLEX ) 4 MG tablet, Take 1 tablet (4 mg total) by mouth every 8 (eight) hours as needed for muscle spasms., Disp: , Rfl:    zolpidem  (AMBIEN ) 10 MG tablet, Take 1 tablet (10 mg total) by mouth at bedtime as needed for sleep., Disp: , Rfl:   Allergies  Allergen Reactions   Naproxen Sodium Rash and Other (See Comments)    800 mg tablets that the  MD prescribed for pain.    I personally reviewed active problem list, medication list, allergies with the patient/caregiver today.   ROS  Ten systems reviewed and is negative except as mentioned in HPI    Objective Physical Exam   CONSTITUTIONAL: Patient appears well-developed and well-nourished. No distress. HEENT: Head atraumatic, normocephalic, neck supple. CARDIOVASCULAR: Normal rate, regular rhythm and normal heart sounds. No murmur heard. Ankle with slight swelling PULMONARY: Effort normal and breath sounds normal. No respiratory distress. ABDOMINAL: There is no tenderness or distention. PSYCHIATRIC: Patient has a normal mood and affect. Behavior is normal. Judgment and thought content normal. BREAST: Left breast incision inflamed, red, no pus, no induration, nipple  harder, underlying hardness.  Vitals:   04/23/24 1303  BP: 130/74  Pulse: 83  Resp: 16  SpO2: 95%  Weight: 286 lb 3.2 oz (129.8 kg)  Height: 5\' 11"  (1.803 m)    Body mass index is 39.92 kg/m.  Recent Results (from the past 2160 hours)  Lactic acid, plasma     Status: None   Collection Time: 03/09/24  1:12 PM  Result Value Ref Range   Lactic Acid, Venous 1.8 0.5 - 1.9 mmol/L    Comment: Performed at [facility], [address] [place], [place] [address]  Comprehensive metabolic panel     Status: Abnormal   Collection Time: 03/09/24  1:12 PM  Result Value Ref Range   Sodium 134 (L) 135 - 145 mmol/L   Potassium 4.1 3.5 - 5.1 mmol/L   Chloride 103 98 - 111 mmol/L   CO2 20 (L) 22 - 32 mmol/L   Glucose, Bld 107 (H) 70 - 99 mg/dL    Comment: Glucose reference range applies only to samples taken after fasting for at least 8 hours.   BUN 10 8 - 23 mg/dL   Creatinine, Ser 6.04 0.61 - 1.24 mg/dL   Calcium  9.2 8.9 - 10.3 mg/dL   Total Protein 7.0 6.5 - 8.1 g/dL   Albumin 3.8 3.5 - 5.0 g/dL   AST 43 (H) 15 - 41 U/L   ALT 52 (H) 0 - 44 U/L   Alkaline Phosphatase 63 38 - 126 U/L   Total Bilirubin 0.6 0.0 - 1.2 mg/dL   GFR, Estimated >54 >09 mL/min    Comment: (NOTE) Calculated using the CKD-EPI Creatinine Equation (2021)    Anion gap 11 5 - 15    Comment: Performed at [facility], [address] [place], [place] [address]  CBC with Differential     Status: Abnormal   Collection Time: 03/09/24  1:12 PM  Result Value Ref Range   WBC 14.7 (H) 4.0 - 10.5 K/uL   RBC 5.67 4.22 - 5.81 MIL/uL   Hemoglobin 17.9 (H) 13.0 - 17.0 g/dL   HCT 47.8 (H) 29.5 - 62.1 %   MCV 96.6 80.0 - 100.0 fL   MCH 31.6 26.0 - 34.0 pg   MCHC 32.7 30.0 - 36.0 g/dL   RDW 30.8 65.7 - 84.6 %   Platelets 239 150 - 400 K/uL   nRBC 0.0 0.0 - 0.2 %   Neutrophils Relative % 68 %   Neutro Abs 9.9 (H) 1.7 - 7.7 K/uL   Lymphocytes Relative 23 %   Lymphs Abs 3.4 0.7 - 4.0 K/uL   Monocytes  Relative 7 %   Monocytes Absolute 1.1 (H) 0.1 - 1.0 K/uL   Eosinophils Relative 1 %   Eosinophils Absolute 0.2 0.0 - 0.5 K/uL   Basophils  Relative 1 %   Basophils Absolute 0.1 0.0 - 0.1 K/uL   Immature Granulocytes 0 %   Abs Immature Granulocytes 0.06 0.00 - 0.07 K/uL    Comment: Performed at [facility], [address], [place], [place] [address]  Cytology - Non PAP; Aspiration from left breast retroareolar fluid collection     Status: None   Collection Time: 03/14/24 12:00 AM  Result Value Ref Range   CYTOLOGY - NON GYN      CYTOLOGY - NON PAP [facility] [address] [place], [place] [address] Telephone [phone] or [phone] Fax [phone]  CYTOPATHOLOGY REPORT   Accession #: [id number] Patient Name: GERVASE, PETRICCA Visit # : [id number]  MRN: [id number] Physician: Mir, Luann Rundle DOB/Age Nov 23, 1956 (Age: 51) Gender: M Collected Date: 03/14/2024 Received Date: 03/14/2024  FINAL DIAGNOSIS STATEMENT Of SPECIMEN ADEQUACY:  INTERPRETATION(S):      - ACUTE INFLAMMATORY CELLS AND BENIGN SQUAMOUS CELLS.      DATE SIGNED OUT: 03/15/2024 ELECTRONIC SIGNATURE : [place] Md, Mark, Pathologist, Electronic Signature   CASE COMMENTS   CLINICAL HISTORY  SOURCE OF SPECIMEN(S) Breast, Cyst/Fluid  SPECIMEN COMMENTS: SPECIMEN CLINICAL INFORMATION: 1. Recurrent left breast fluid collections    Gross Description Specimen: Received is/are 12cc of red fluid in a sterile syringe      Prepared:      # Smears: 0      # Concentration Technique Slides  (i.e. ThinPrep): 1      # Cell Block: 1      # Diff-Quick Stain: 0        Report signed out from the following location(s) [place]. [facility] [address] [place], [place] [address] CLIA #: [id number]  [facility] [address] [place], [place] [address] CLIA #: [id number]   Surgical pathology     Status: None   Collection Time: 03/14/24  12:00 AM  Result Value Ref Range   SURGICAL PATHOLOGY      SURGICAL PATHOLOGY [facility] [address] [place], [place] [address] Telephone [phone] or [phone] Fax [phone]  REPORT OF SURGICAL PATHOLOGY   Accession #: [id number] Patient Name: KEVAUN, BONEWITZ Visit # : [id number]  MRN: [id number] Physician: Elester Grim DOB/Age [birth year] (Age: 66) Gender: M Collected Date: 03/14/2024 Received Date: 03/14/2024  FINAL DIAGNOSIS       1. Breast, left, needle core biopsy, retroareolar, 12 o'clock (no clip) :       - BENIGN SKIN WITH MIXED INFLAMMATORY INFILTRATE AND HYPEREMIA.      - BENIGN DUCTS IN DEEP TISSUE.      - NEGATIVE FOR MALIGNANCY.       DATE SIGNED OUT: 03/15/2024 ELECTRONIC SIGNATURE : [place] Md, Mark, Pathologist, Electronic Signature  MICROSCOPIC DESCRIPTION 1. Comment: The findings were conveyed to Ladonna Pickup via Secure Chat at 10:35  a.m. on 03/15/24.  CASE COMMENTS STAINS USED IN DIAGNOSIS: H&E-2 H&E-3 H&E-4 H&E    CLINICAL HISTORY  SPECIMEN (S) OBTAINED 1. Breast, left, needle core biopsy, Retroareolar, 12 O'clock (no Clip)  SPECIMEN COMMENTS: 1. TIF: 9:25 AM, CIT < 1 min; recurrent complex fluid collection in retroareolar left breast with recent development of greenish white nipple discharge; patient has a strong family history of breast cancer, positive in two sisters and maternal grandmother SPECIMEN CLINICAL INFORMATION: 1. Most likely recurrent abscess, malignancy considered less likely    Gross Description 1. Received in formalin, time in formalin  9:25 a.m. and CIT less than 1 minute, are fragments of tan to hemorrhagic soft tissue measuring 0.7 x 0.6 x 0.2 cm in aggregate.   The specimen is submitted in toto.  (GRP:kh 03/14/24)        Report signed out from the following location(s) [place]. [facility] [address] [place], [place] [address] CLIA #:  [id number]  [facility] [address] [place], [place] [address] CLIA #: [id number]   Aerobic/Anaerobic Culture w Gram Stain (surgical/deep wound)     Status: None   Collection Time: 03/14/24  9:34 AM   Specimen: Breast  Result Value Ref Range   Specimen Description      BREAST Performed at [facility], [address], [place], [place] [address]    Special Requests      NONE Performed at [facility], [address] [place], [place] [address]    Gram Stain      ABUNDANT WBC PRESENT, PREDOMINANTLY PMN MODERATE GRAM POSITIVE COCCI IN CHAINS    Culture      ABUNDANT STAPHYLOCOCCUS LUGDUNENSIS FEW ANAEROBIC GRAM POSITIVE COCCI UNABLE TO FURTHER IDENTIFY Performed at [facility], [address] [place], [place] [address]    Report Status 03/22/2024 FINAL    Organism ID, Bacteria STAPHYLOCOCCUS LUGDUNENSIS       Susceptibility   Staphylococcus lugdunensis - MIC*    CIPROFLOXACIN <=0.5 SENSITIVE Sensitive     ERYTHROMYCIN <=0.25 SENSITIVE Sensitive     GENTAMICIN <=0.5 SENSITIVE Sensitive     OXACILLIN 1 SENSITIVE Sensitive     TETRACYCLINE <=1 SENSITIVE Sensitive     VANCOMYCIN 1 SENSITIVE Sensitive     TRIMETH/SULFA <=10 SENSITIVE Sensitive     CLINDAMYCIN <=0.25 SENSITIVE Sensitive     RIFAMPIN <=0.5 SENSITIVE Sensitive     Inducible Clindamycin NEGATIVE Sensitive     * ABUNDANT STAPHYLOCOCCUS LUGDUNENSIS  Surgical pathology     Status: None   Collection Time: 04/04/24 12:00 AM  Result Value Ref Range   SURGICAL PATHOLOGY      SURGICAL PATHOLOGY [facility] [address] [place], [place] [address] Telephone [phone] or [phone] Fax [phone]  REPORT OF SURGICAL PATHOLOGY   Accession #: [id number] Patient Name: TREVONE, LINDEN Visit # : [id number]  MRN: [id number] Physician: Conrado Delay DOB/Age Apr 15, 1956 (Age: 31) Gender: M Collected Date: 04/04/2024 Received Date:  04/04/2024  FINAL DIAGNOSIS       1. Breast, lumpectomy, left :       - CYSTIC AREA OF INFLAMED GRANULATION TISSUE AND ABSCESS WITH FOCAL ADJACENT      RESIDUAL DILATED DUCT WITH SQUAMOUS METAPLASIA (SMOLD), SEE NOTE.      - CHANGES CONSISTENT WITH PRIOR BIOPSY/FNA.      - NEGATIVE FOR MALIGNANCY.       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      DATE SIGNED OUT: 04/05/2024 ELECTRONIC SIGNATURE : Brunetta Capes Md, Evonnie Hoit gist, Electronic Signature  MICROSCOPIC DESCRIPTION  CASE COMMENTS STAINS USED IN DIAGNOSIS: H&E H&E H&E H&E H&E H&E    CLINICAL HISTORY  SPECIMEN(S) OBTAINED 1. Breast, lumpectomy, Left  SPECIMEN COMMENTS: SPECIMEN CLINICAL INFORMATION:    Gross Description 1. Specimen: Breast lumpectomy, "left breast cyst"      Size: 2.1 cm anterior-posterior, 4.1  cm superior-inferior, 4.5 cm medial-lateral      Orientation: The specimen is received inked and is sectioned from medial to      lateral.      Anterior: Green      Posterior: Black      Superior: Red      Inferior: Blue      Medial: Yellow      Lateral: Orange      Localized area: Not applicable.      Lesion: 3.1 x 2.0 x 1.8; cavity; contiguous with anterior margin defect;      diffusely hemorrhagic and lobulated lining surrounded by white-pink fibrous      tissue and focal fat necrosis.      Margins:      Anterior: Abutting (less than 0.1 cm)      Posterior: 0.6 cm      Superior: 0.7 cm      Inf erior: 0.3 cm      Medial: 1.2 cm      Lateral: 0.3 cm      Additional findings: There is a 1.0 cm defect on the anterior surface. The cut      surfaces consist of 20% white-pink fibrous tissue and 80% soft, lobulated      adipose. Discrete calcifications are absent.      Prognostic indicators: Perform on paraffin blocks as needed.      Block  summary:      1A-E: Lesion, representative (medial to lateral)      1A-C: Anterior, superior, posterior, inferior margin      1D: Lateral and inferior margin      1E: Lateral and anterior margin      21F: Medial margin, uninvolved (representative)      Collection time: 04/04/2024 at 0756      Time in formalin: 04/04/2024 at 0835      Cold ischemia time: 39 minutes      AMG 04/04/2024        Report signed out from the following location(s) [place]. [facility] [address], [place] [address] CLIA #: [id number]  [facility] [address] [place], [place] [address] CLIA #: [id number]   Glucose, capillary     Status: Abnormal   Collection Time: 04/04/24  8:21 AM  Result Value Ref Range   Glucose-Capillary 131 (H) 70 - 99 mg/dL    Comment: Glucose reference range applies only to samples taken after fasting for at least 8 hours.  POCT glycosylated hemoglobin (Hb A1C)     Status: Abnormal   Collection Time: 04/23/24  1:11 PM  Result Value Ref Range   Hemoglobin A1C 6.7 (A) 4.0 - 5.6 %   HbA1c POC (<> result, manual entry)     HbA1c, POC (prediabetic range)     HbA1c, POC (controlled diabetic range)      Diabetic Foot Exam:     PHQ2/9:    04/23/2024   12:58 PM 12/25/2023    2:32 PM 07/18/2023    9:16 AM 07/04/2023    8:17 AM 01/11/2023    8:17 AM  Depression screen PHQ 2/9  Decreased Interest 2 1 0 0 1  Down, Depressed, Hopeless 2 1 0 0 1  PHQ - 2 Score 4 2 0 0 2  Altered sleeping 1 1 0 0 0  Tired, decreased energy 1 1 0 0 0  Change in appetite 1 1 0 0 0  Feeling bad or failure about yourself  0 0 0 0 0  Trouble concentrating 1 0 0 0 0  Moving slowly or fidgety/restless 0 0 0 0 0  Suicidal thoughts 0 0 0 0 0  PHQ-9 Score 8 5 0 0 2  Difficult doing work/chores Somewhat difficult Somewhat difficult       phq 9 is positive  Fall Risk:    04/23/2024   12:58 PM 12/25/2023    2:27 PM 07/18/2023    9:16 AM 07/04/2023    8:16 AM  01/11/2023    8:17 AM  Fall Risk   Falls in the past year? 0 0 0 0 0  Number falls in past yr: 0 0   0  Injury with Fall? 0 0   0  Risk for fall due to : No Fall Risks No Fall Risks No Fall Risks No Fall Risks No Fall Risks  Follow up Falls prevention discussed;Education provided;Falls evaluation completed Falls prevention discussed;Education provided;Falls evaluation completed Falls prevention discussed Falls prevention discussed;Education provided;Falls evaluation completed Falls prevention discussed     Assessment & Plan Post-surgical breast incision inflammation Post-surgical inflammation of the left breast incision with redness and tenderness, likely due to stitches. Possible infection or abscess considered. Reassured about post-operative visit costs. - Contact Dr. Rosea Conch for evaluation and possible ultrasound to rule out abscess. - Educated on post-operative follow-up importance and cost coverage.  Type 2 diabetes mellitus Type 2 diabetes with A1c at 6.7, indicating controlled diabetes. Prefers diet and exercise management. - Order urine test for proteinuria. - Monitor blood glucose levels and A1c. - Encourage diet and exercise.  Dyslipidemia Dyslipidemia associated with type 2 diabetes and hypertension. On atorvastatin . - Refill atorvastatin  prescription.  Obesity/Morbid Obesity with BMI over 35, associated with type 2 diabetes and hypertension. Weight slightly decreasing with physical activity. - Encourage physical activity and healthy eating.  Simple Chronic bronchitis Chronic bronchitis with wet cough and phlegm, exacerbated by smoking. Uses Trelegy as needed. - Continue Trelegy as needed. - Advise smoking cessation.  Chronic pain syndrome Chronic pain syndrome with discomfort in shoulders and back, worsened by weather. On tizanidine  and zolpidem . Prefers higher tizanidine  dosage. - Increase tizanidine  to 4 mg every 6 hours. - Continue zolpidem  10 mg at  bedtime.  History of Alcoholism -still drinks but only occasionally   Seasonal affective disorder Seasonal affective disorder symptoms improving in spring. Emotional state better but frustrated with post-surgical issues.

## 2024-04-24 ENCOUNTER — Ambulatory Visit: Payer: Self-pay | Admitting: Family Medicine

## 2024-04-24 LAB — MICROALBUMIN / CREATININE URINE RATIO
Creatinine, Urine: 193 mg/dL (ref 20–320)
Microalb Creat Ratio: 3 mg/g{creat} (ref <30)
Microalb, Ur: 0.6 mg/dL

## 2024-05-21 DIAGNOSIS — Z8249 Family history of ischemic heart disease and other diseases of the circulatory system: Secondary | ICD-10-CM | POA: Diagnosis not present

## 2024-05-21 DIAGNOSIS — M62838 Other muscle spasm: Secondary | ICD-10-CM | POA: Diagnosis not present

## 2024-05-21 DIAGNOSIS — G4700 Insomnia, unspecified: Secondary | ICD-10-CM | POA: Diagnosis not present

## 2024-05-21 DIAGNOSIS — Z6841 Body Mass Index (BMI) 40.0 and over, adult: Secondary | ICD-10-CM | POA: Diagnosis not present

## 2024-05-21 DIAGNOSIS — E785 Hyperlipidemia, unspecified: Secondary | ICD-10-CM | POA: Diagnosis not present

## 2024-05-21 DIAGNOSIS — F17210 Nicotine dependence, cigarettes, uncomplicated: Secondary | ICD-10-CM | POA: Diagnosis not present

## 2024-05-21 DIAGNOSIS — I10 Essential (primary) hypertension: Secondary | ICD-10-CM | POA: Diagnosis not present

## 2024-05-21 DIAGNOSIS — N400 Enlarged prostate without lower urinary tract symptoms: Secondary | ICD-10-CM | POA: Diagnosis not present

## 2024-05-21 DIAGNOSIS — J4489 Other specified chronic obstructive pulmonary disease: Secondary | ICD-10-CM | POA: Diagnosis not present

## 2024-05-21 DIAGNOSIS — F1021 Alcohol dependence, in remission: Secondary | ICD-10-CM | POA: Diagnosis not present

## 2024-05-21 DIAGNOSIS — M1990 Unspecified osteoarthritis, unspecified site: Secondary | ICD-10-CM | POA: Diagnosis not present

## 2024-05-30 ENCOUNTER — Other Ambulatory Visit: Payer: Self-pay | Admitting: Family Medicine

## 2024-05-30 DIAGNOSIS — G894 Chronic pain syndrome: Secondary | ICD-10-CM

## 2024-08-14 ENCOUNTER — Other Ambulatory Visit: Payer: Self-pay | Admitting: Family Medicine

## 2024-08-14 DIAGNOSIS — G894 Chronic pain syndrome: Secondary | ICD-10-CM

## 2024-08-14 DIAGNOSIS — F5104 Psychophysiologic insomnia: Secondary | ICD-10-CM

## 2024-08-28 ENCOUNTER — Other Ambulatory Visit: Payer: Self-pay | Admitting: Family Medicine

## 2024-08-28 DIAGNOSIS — J410 Simple chronic bronchitis: Secondary | ICD-10-CM

## 2024-08-28 DIAGNOSIS — F5104 Psychophysiologic insomnia: Secondary | ICD-10-CM

## 2024-09-04 ENCOUNTER — Other Ambulatory Visit: Payer: Self-pay

## 2024-09-04 ENCOUNTER — Other Ambulatory Visit: Payer: Self-pay | Admitting: Family Medicine

## 2024-09-04 DIAGNOSIS — F5104 Psychophysiologic insomnia: Secondary | ICD-10-CM

## 2024-09-04 NOTE — Telephone Encounter (Signed)
 Copied from CRM [id number]. Topic: Clinical - Prescription Issue >> Sep 04, 2024  8:16 AM Carl Burke wrote: Reason for CRM: Pt called in to see about medication zolpidem  (AMBIEN ) 10 MG tablet.    Pt said pharmacy is calling in and is being told no for refill.   Pt would like medication filled and sent to   [facility] [place], [place] - [address] [place] [address] Phone: [phone]  Fax: [phone]

## 2024-09-04 NOTE — Telephone Encounter (Signed)
 Rx printed instead of being sent electronically. Please re send

## 2024-09-25 ENCOUNTER — Other Ambulatory Visit: Payer: Self-pay | Admitting: Family Medicine

## 2024-09-25 DIAGNOSIS — E1169 Type 2 diabetes mellitus with other specified complication: Secondary | ICD-10-CM

## 2024-09-26 ENCOUNTER — Telehealth: Payer: Self-pay

## 2024-09-26 NOTE — Telephone Encounter (Signed)
 Copied from CRM #[id number]. Topic: Higher education careers adviser Patient (Clinic Use ONLY) >> Sep 26, 2024  1:46 PM [name] wrote: Reason for CRM: lvm asking pt to call back and let our office know if he has had his diabetic eye exam and if so where? >> Sep 26, 2024  1:51 PM [name] wrote: Patient called back and advised that he has one scheduled for 11/05/24 at [facility].

## 2024-10-19 ENCOUNTER — Other Ambulatory Visit: Payer: Self-pay | Admitting: Family Medicine

## 2024-10-19 DIAGNOSIS — I10 Essential (primary) hypertension: Secondary | ICD-10-CM

## 2024-10-21 ENCOUNTER — Other Ambulatory Visit: Payer: Self-pay | Admitting: Medical Genetics

## 2024-10-22 ENCOUNTER — Other Ambulatory Visit
Admission: RE | Admit: 2024-10-22 | Discharge: 2024-10-22 | Disposition: A | Discharge status: Home / Self Care | Payer: Self-pay | Source: Ambulatory Visit | Attending: Medical Genetics | Admitting: Medical Genetics

## 2024-10-24 ENCOUNTER — Ambulatory Visit: Admitting: Family Medicine

## 2024-10-24 ENCOUNTER — Encounter: Payer: Self-pay | Admitting: Family Medicine

## 2024-10-24 VITALS — BP 124/70 | HR 82 | Resp 16 | Ht 71.0 in | Wt 284.1 lb

## 2024-10-24 DIAGNOSIS — E559 Vitamin D deficiency, unspecified: Secondary | ICD-10-CM | POA: Diagnosis not present

## 2024-10-24 DIAGNOSIS — J410 Simple chronic bronchitis: Secondary | ICD-10-CM | POA: Diagnosis not present

## 2024-10-24 DIAGNOSIS — E1159 Type 2 diabetes mellitus with other circulatory complications: Secondary | ICD-10-CM | POA: Diagnosis not present

## 2024-10-24 DIAGNOSIS — F1021 Alcohol dependence, in remission: Secondary | ICD-10-CM | POA: Diagnosis not present

## 2024-10-24 DIAGNOSIS — E538 Deficiency of other specified B group vitamins: Secondary | ICD-10-CM

## 2024-10-24 DIAGNOSIS — D72829 Elevated white blood cell count, unspecified: Secondary | ICD-10-CM | POA: Diagnosis not present

## 2024-10-24 DIAGNOSIS — E1169 Type 2 diabetes mellitus with other specified complication: Secondary | ICD-10-CM | POA: Diagnosis not present

## 2024-10-24 DIAGNOSIS — F5104 Psychophysiologic insomnia: Secondary | ICD-10-CM

## 2024-10-24 DIAGNOSIS — E785 Hyperlipidemia, unspecified: Secondary | ICD-10-CM | POA: Diagnosis not present

## 2024-10-24 DIAGNOSIS — F338 Other recurrent depressive disorders: Secondary | ICD-10-CM

## 2024-10-24 DIAGNOSIS — G894 Chronic pain syndrome: Secondary | ICD-10-CM

## 2024-10-24 DIAGNOSIS — R600 Localized edema: Secondary | ICD-10-CM

## 2024-10-24 DIAGNOSIS — Z9889 Other specified postprocedural states: Secondary | ICD-10-CM

## 2024-10-24 DIAGNOSIS — I152 Hypertension secondary to endocrine disorders: Secondary | ICD-10-CM

## 2024-10-24 LAB — POCT GLYCOSYLATED HEMOGLOBIN (HGB A1C): Hemoglobin A1C: 6.2 % — AB (ref 4.0–5.6)

## 2024-10-24 MED ORDER — AMLODIPINE BESY-BENAZEPRIL HCL 5-20 MG PO CAPS: 1.0000 | ORAL_CAPSULE | Freq: Every day | ORAL | 1 refills | Status: AC

## 2024-10-24 MED ORDER — TIZANIDINE HCL 4 MG PO TABS: 4.0000 mg | ORAL_TABLET | Freq: Three times a day (TID) | ORAL | 0 refills | Status: DC

## 2024-10-24 MED ORDER — ATORVASTATIN CALCIUM 40 MG PO TABS: 40.0000 mg | ORAL_TABLET | Freq: Every day | ORAL | 1 refills | Status: AC

## 2024-10-24 NOTE — Progress Notes (Signed)
 Name: Carl Burke   MRN: [id number]    DOB: 14-May-1956   Date:10/24/2024       Progress Note  Subjective  Chief Complaint  Chief Complaint  Patient presents with   Medical Management of Chronic Issues   Discussed the use of AI scribe software for clinical note transcription with the patient, who gave verbal consent to proceed.  History of Present Illness Carl Burke is a 68 year old male with type 2 diabetes, chronic back pain, and chronic bronchitis who presents for a follow-up visit.  He has been experiencing leg swelling, which has improved with magnesium glycinate supplementation after identifying a dietary deficiency. The reduction in broccoli consumption due to gastrointestinal discomfort contributed to low magnesium levels.  He underwent surgery for a left breast abscess in May, initially identified as a staphylococcus infection. He had a re-excision of a breast lumpectomy in April. The area remains sore but is no longer swollen or erythematous.  Chronic back pain persists, rated as 7 out of 10 today. The pain is primarily in his legs and lower back, without radiation down the legs. He also experiences leg swelling.  He has a history of type 2 diabetes, with A1c levels previously recorded at 6.5% in May 2022, 6.4% in July 2024, and 6.7% in May 2025. His current A1c is 6.2%. He manages his diabetes through dietary changes and is not on diabetes medication. He also has associated conditions of high blood pressure and dyslipidemia, for which he takes atorvastatin  and amlodipine /benazepril .  He has chronic bronchitis and continues to smoke cigars. He uses Trelegy as needed to help with breathing difficulties, particularly when experiencing shortness of breath. He attributes his cough primarily to allergies and sinus drainage.  He has a history of alcoholism but reports that he is not dependent on alcohol and only drinks occasionally. He has a history of high white blood  cell count and elevated liver enzymes, which have been monitored in the past. He takes B12 supplements due to previously low levels.  He has obesity, with a BMI over 35, but attributes his weight to muscle mass rather than excess fat. 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    Patient Active Problem List   Diagnosis Date Noted   Dyslipidemia associated with type 2 diabetes mellitus (HCC) 04/23/2024   History of colonic polyps 08/24/2023   Family history of colon cancer in father 08/24/2023   Polyp of ascending colon 08/24/2023   Polyp of sigmoid colon 08/24/2023   Diabetes mellitus type 2, diet-controlled (HCC) 07/18/2023   B12 deficiency 07/18/2023   Chronic insomnia 07/18/2023   Vitamin D  deficiency 07/18/2023   Chronic pain syndrome 07/18/2023   Simple chronic bronchitis (HCC) 01/11/2023   Morbid obesity (HCC) 01/11/2023   Senile purpura 04/04/2022   History of alcoholism (HCC) 04/04/2022   Seasonal affective disorder 01/19/2021   Dyslipidemia 10/31/2019   Umbilical hernia without obstruction and without gangrene 07/17/2019   Degeneration of lumbosacral intervertebral disc 03/28/2018   Full thickness rotator cuff tear 03/28/2018   Neck pain 03/28/2018   Leukocytosis 07/17/2016   Chronic shoulder pain (Right) 04/12/2016   Cervical spondylosis 04/12/2016   Chronic upper back pain (Location of Secondary source of pain) (Bilateral) (midline) 04/12/2016   Essential hypertension 04/12/2016   Lumbosacral radiculopathy at S1 (Left) 04/12/2016   Rotator cuff arthropathy of right shoulder 08/15/2013    Past Surgical History:  Procedure Laterality Date   BREAST BIOPSY  Left 03/14/2024   US  LT BREAST BX W LOC DEV 1ST LESION IMG BX SPEC US  GUIDE 03/14/2024 [facility]   COLONOSCOPY     COLONOSCOPY WITH PROPOFOL  N/A 08/23/2018   Procedure: COLONOSCOPY WITH PROPOFOL ;  Surgeon: Unk Corinn Skiff, MD;  Location: [facility] ENDOSCOPY;  Service:  Gastroenterology;  Laterality: N/A;   COLONOSCOPY WITH PROPOFOL  N/A 08/24/2023   Procedure: COLONOSCOPY WITH PROPOFOL ;  Surgeon: Unk Corinn Skiff, MD;  Location: [facility] ENDOSCOPY;  Service: Gastroenterology;  Laterality: N/A;   ESOPHAGOGASTRODUODENOSCOPY N/A 07/19/2016   Procedure: ESOPHAGOGASTRODUODENOSCOPY (EGD);  Surgeon: Lupita FORBES Commander, MD;  Location: [facility] ENDOSCOPY;  Service: Endoscopy;  Laterality: N/A;   ESOPHAGOGASTRODUODENOSCOPY (EGD) WITH PROPOFOL  N/A 08/23/2018   Procedure: ESOPHAGOGASTRODUODENOSCOPY (EGD) WITH PROPOFOL ;  Surgeon: Unk Corinn Skiff, MD;  Location: [facility] ENDOSCOPY;  Service: Gastroenterology;  Laterality: N/A;   POLYPECTOMY  08/24/2023   Procedure: POLYPECTOMY;  Surgeon: Unk Corinn Skiff, MD;  Location: [facility] ENDOSCOPY;  Service: Gastroenterology;;   RE-EXCISION OF BREAST LUMPECTOMY Left 04/04/2024   Procedure: EXCISION, LESION, BREAST;  Surgeon: Tye Millet, DO;  Location: [facility] ORS;  Service: General;  Laterality: Left;   ROTATOR CUFF REPAIR Left 2011   ROTATOR CUFF REPAIR Right 2013    Family History  Problem Relation Age of Onset   Heart disease Mother    Colon cancer Father    Cancer Sister    Breast cancer Sister 31   Cancer Sister     Social History   Tobacco Use   Smoking status: Some Days    Current packs/day: 0.00    Average packs/day: 0.5 packs/day for 51.4 years (25.7 ttl pk-yrs)    Types: Cigarettes    Start date: 09/30/1970    Last attempt to quit: 03/12/2022    Years since quitting: 2.6   Smokeless tobacco: Never  Substance Use Topics   Alcohol use: Yes    Alcohol/week: 0.0 standard drinks of alcohol    Comment: Rare/occassional      Current Outpatient Medications:    acetaminophen  (TYLENOL ) 500 MG tablet, Take 1,000 mg by mouth every 8 (eight) hours as needed for moderate pain (pain score 4-6)., Disp: , Rfl:    amLODipine -benazepril  (LOTREL) 5-20 MG capsule, Take 1 capsule by mouth daily., Disp: 90 capsule, Rfl: 1   atorvastatin   (LIPITOR) 40 MG tablet, Take 1 tablet (40 mg total) by mouth daily., Disp: 90 tablet, Rfl: 1   azelastine  (ASTELIN ) 0.1 % nasal spray, Place 1 spray into both nostrils 2 (two) times daily as needed for allergies or rhinitis. Use in each nostril as directed, Disp: , Rfl:    Fluticasone -Umeclidin-Vilant (TRELEGY ELLIPTA ) 100-62.5-25 MCG/ACT AEPB, INHALE 1 PUFF EVERY DAY, Disp: 180 each, Rfl: 0   ibuprofen  (ADVIL ) 200 MG tablet, Take 400 mg by mouth every 8 (eight) hours as needed for moderate pain (pain score 4-6)., Disp: , Rfl:    loratadine  (CLARITIN ) 10 MG tablet, Take 1 tablet (10 mg total) by mouth daily as needed for allergies., Disp: , Rfl:    tiZANidine  (ZANAFLEX ) 4 MG tablet, TAKE 1 TABLET THREE TIMES DAILY, Disp: 270 tablet, Rfl: 0   zolpidem  (AMBIEN ) 10 MG tablet, TAKE 1 TABLET AT BEDTIME AS NEEDED FOR SLEEP, Disp: 90 tablet, Rfl: 0  Allergies  Allergen Reactions   Naproxen Sodium Rash and Other (See Comments)    800 mg tablets that the MD prescribed for pain.    I personally reviewed active problem list, medication list, allergies, family history with the patient/caregiver today.   ROS  Ten systems reviewed and is negative except as mentioned in HPI    Objective Physical Exam VITALS: BP- 124/70 MEASUREMENTS: BMI- 35.0. CONSTITUTIONAL: Patient appears well-developed and well-nourished. No distress. HEENT: Head atraumatic, normocephalic, neck supple. CARDIOVASCULAR: Normal rate, regular rhythm and normal heart sounds. No murmur heard. No edema in extremities. PULMONARY: Effort normal and breath sounds normal. Lungs clear to auscultation. No respiratory distress. ABDOMINAL: There is no tenderness or distention. MUSCULOSKELETAL: Normal gait. Without gross motor or sensory deficit. PSYCHIATRIC: Patient has a normal mood and affect. Behavior is normal. Judgment and thought content normal.  Vitals:   10/24/24 1050  BP: 124/70  Pulse: 82  Resp: 16  SpO2: 98%  Weight: 284 lb  1.6 oz (128.9 kg)  Height: 5' 11 (1.803 m)    Body mass index is 39.62 kg/m.  Recent Results (from the past 2160 hours)  POCT glycosylated hemoglobin (Hb A1C)     Status: Abnormal   Collection Time: 10/24/24 10:55 AM  Result Value Ref Range   Hemoglobin A1C 6.2 (A) 4.0 - 5.6 %   HbA1c POC (<> result, manual entry)     HbA1c, POC (prediabetic range)     HbA1c, POC (controlled diabetic range)      Diabetic Foot Exam:  Diabetic foot exam was performed with the following findings:   Normal sensation of 10g monofilament Intact posterior tibialis and dorsalis pedis pulses Callus       PHQ2/9:    10/24/2024   10:50 AM 04/23/2024   12:58 PM 12/25/2023    2:32 PM 07/18/2023    9:16 AM 07/04/2023    8:17 AM  Depression screen PHQ 2/9  Decreased Interest 0 2 1 0 0  Down, Depressed, Hopeless 0 2 1 0 0  PHQ - 2 Score 0 4 2 0 0  Altered sleeping  1 1 0 0  Tired, decreased energy  1 1 0 0  Change in appetite  1 1 0 0  Feeling bad or failure about yourself   0 0 0 0  Trouble concentrating  1 0 0 0  Moving slowly or fidgety/restless  0 0 0 0  Suicidal thoughts  0 0 0 0  PHQ-9 Score  8  5  0  0   Difficult doing work/chores  Somewhat difficult Somewhat difficult       Data saved with a previous flowsheet row definition    phq 9 is negative  Fall Risk:    10/24/2024   10:43 AM 04/23/2024   12:58 PM 12/25/2023    2:27 PM 07/18/2023    9:16 AM 07/04/2023    8:16 AM  Fall Risk   Falls in the past year? 0 0 0 0 0  Number falls in past yr: 0 0 0    Injury with Fall? 0 0 0    Risk for fall due to : No Fall Risks No Fall Risks No Fall Risks No Fall Risks No Fall Risks  Follow up Falls evaluation completed Falls prevention discussed;Education provided;Falls evaluation completed Falls prevention discussed;Education provided;Falls evaluation completed Falls prevention discussed Falls prevention discussed;Education provided;Falls evaluation completed      Assessment & Plan Type 2  diabetes mellitus with associated hypertension and dyslipidemia Type 2 diabetes mellitus with improved A1c of 6.2. Managed with dietary changes. Hypertension well-controlled. Lipid panel re-evaluation needed. - Ordered lipid panel. - Continue atorvastatin . - Continue amlodipine /benazepril .  Chronic pain syndrome with lower back and leg involvement Chronic pain syndrome with pain level 7/10,  managed with tizanidine . Persistent pain due to nerve damage. - Continue tizanidine  4 mg three times daily. - Sent prescription for tizanidine  to [facility].  Chronic simple bronchitis Occasional cough due to allergies. Trelegy helps with breathing. - Continue Trelegy as needed.  Morbid obesity BMI over 35 with co-morbidities. Managed through diet. Genetic predisposition acknowledged. - Encouraged continued dietary management.  Vitamin B12 deficiency Previously low B12 levels, currently on supplements. - Ordered B12 and folate levels.  Localized edema of lower extremities Edema controlled with magnesium. No current swelling or signs of heart failure. - Continue magnesium supplementation as needed. - Advised to come in to be seen when edema is present  Postprocedural state, left breast abscess/lumpectomy Residual soreness, no swelling or infection. Pain likely from nerve damage.  Insomnia Managed with zolpidem . Issues with mail order pharmacy refills. - Sent zolpidem  prescription to [facility] for 90-day supply.

## 2024-10-25 LAB — COMPREHENSIVE METABOLIC PANEL WITH GFR
AG Ratio: 1.8 (calc) (ref 1.0–2.5)
ALT: 42 U/L (ref 9–46)
AST: 33 U/L (ref 10–35)
Albumin: 4.5 g/dL (ref 3.6–5.1)
Alkaline phosphatase (APISO): 73 U/L (ref 35–144)
BUN: 16 mg/dL (ref 7–25)
CO2: 25 mmol/L (ref 20–32)
Calcium: 9.7 mg/dL (ref 8.6–10.3)
Chloride: 102 mmol/L (ref 98–110)
Creat: 0.74 mg/dL (ref 0.70–1.35)
Globulin: 2.5 g/dL (ref 1.9–3.7)
Glucose, Bld: 100 mg/dL — ABNORMAL HIGH (ref 65–99)
Potassium: 4.3 mmol/L (ref 3.5–5.3)
Sodium: 139 mmol/L (ref 135–146)
Total Bilirubin: 0.6 mg/dL (ref 0.2–1.2)
Total Protein: 7 g/dL (ref 6.1–8.1)
eGFR: 100 mL/min/1.73m2 (ref >=60)

## 2024-10-25 LAB — LIPID PANEL
Cholesterol: 170 mg/dL (ref <200)
HDL: 43 mg/dL (ref >=40)
LDL Cholesterol (Calc): 88 mg/dL
Non-HDL Cholesterol (Calc): 127 mg/dL (ref <130)
Total CHOL/HDL Ratio: 4 (calc) (ref <5.0)
Triglycerides: 322 mg/dL — ABNORMAL HIGH (ref <150)

## 2024-10-25 LAB — B12 AND FOLATE PANEL
Folate: 21.1 ng/mL
Vitamin B-12: 1120 pg/mL — ABNORMAL HIGH (ref 200–1100)

## 2024-10-25 LAB — CBC WITH DIFFERENTIAL/PLATELET
Absolute Lymphocytes: 3285 {cells}/uL (ref 850–3900)
Absolute Monocytes: 1066 {cells}/uL — ABNORMAL HIGH (ref 200–950)
Basophils Absolute: 102 {cells}/uL (ref 0–200)
Basophils Relative: 0.7 %
Eosinophils Absolute: 175 {cells}/uL (ref 15–500)
Eosinophils Relative: 1.2 %
HCT: 53 % — ABNORMAL HIGH (ref 38.5–50.0)
Hemoglobin: 18 g/dL — ABNORMAL HIGH (ref 13.2–17.1)
MCH: 30.6 pg (ref 27.0–33.0)
MCHC: 34 g/dL (ref 32.0–36.0)
MCV: 90 fL (ref 80.0–100.0)
MPV: 11.4 fL (ref 7.5–12.5)
Monocytes Relative: 7.3 %
Neutro Abs: 9972 {cells}/uL — ABNORMAL HIGH (ref 1500–7800)
Neutrophils Relative %: 68.3 %
Platelets: 293 Thousand/uL (ref 140–400)
RBC: 5.89 Million/uL — ABNORMAL HIGH (ref 4.20–5.80)
RDW: 13.9 % (ref 11.0–15.0)
Total Lymphocyte: 22.5 %
WBC: 14.6 Thousand/uL — ABNORMAL HIGH (ref 3.8–10.8)

## 2024-10-27 ENCOUNTER — Ambulatory Visit: Payer: Self-pay | Admitting: Family Medicine

## 2024-10-28 ENCOUNTER — Telehealth: Payer: Self-pay

## 2024-10-28 DIAGNOSIS — D72829 Elevated white blood cell count, unspecified: Secondary | ICD-10-CM

## 2024-10-28 NOTE — Telephone Encounter (Signed)
 Copied from CRM [id number]. Topic: Clinical - Lab/Test Results >> Oct 28, 2024  4:02 PM Selinda RAMAN wrote: Reason for CRM: The patient called back in response to a message his provider left in regards to seeing a Hematologist due to his white and red blood cell count being up. He states yes she can go ahead and send a referral to a Hematologist. Please assist patient further.

## 2024-10-28 NOTE — Telephone Encounter (Signed)
 Referral placed.

## 2024-11-01 ENCOUNTER — Inpatient Hospital Stay

## 2024-11-01 ENCOUNTER — Inpatient Hospital Stay: Admitting: Oncology

## 2024-11-04 LAB — GENECONNECT MOLECULAR SCREEN: Genetic Analysis Overall Interpretation: NEGATIVE

## 2024-11-05 DIAGNOSIS — E119 Type 2 diabetes mellitus without complications: Secondary | ICD-10-CM | POA: Diagnosis not present

## 2024-11-05 DIAGNOSIS — H2513 Age-related nuclear cataract, bilateral: Secondary | ICD-10-CM | POA: Diagnosis not present

## 2024-11-05 DIAGNOSIS — H25013 Cortical age-related cataract, bilateral: Secondary | ICD-10-CM | POA: Diagnosis not present

## 2024-11-05 LAB — OPHTHALMOLOGY REPORT-SCANNED

## 2024-11-13 ENCOUNTER — Inpatient Hospital Stay

## 2024-11-13 ENCOUNTER — Encounter: Payer: Self-pay | Admitting: Oncology

## 2024-11-13 ENCOUNTER — Inpatient Hospital Stay: Attending: Oncology | Admitting: Oncology

## 2024-11-13 VITALS — BP 139/60 | HR 76 | Temp 97.2°F | Resp 18 | Ht 71.0 in | Wt 290.0 lb

## 2024-11-13 DIAGNOSIS — Z800 Family history of malignant neoplasm of digestive organs: Secondary | ICD-10-CM | POA: Diagnosis not present

## 2024-11-13 DIAGNOSIS — F17210 Nicotine dependence, cigarettes, uncomplicated: Secondary | ICD-10-CM | POA: Insufficient documentation

## 2024-11-13 DIAGNOSIS — D72829 Elevated white blood cell count, unspecified: Secondary | ICD-10-CM

## 2024-11-13 DIAGNOSIS — E119 Type 2 diabetes mellitus without complications: Secondary | ICD-10-CM | POA: Diagnosis not present

## 2024-11-13 DIAGNOSIS — Z803 Family history of malignant neoplasm of breast: Secondary | ICD-10-CM | POA: Diagnosis not present

## 2024-11-13 LAB — CBC (CANCER CENTER ONLY)
HCT: 47.8 % (ref 39.0–52.0)
Hemoglobin: 16.6 g/dL (ref 13.0–17.0)
MCH: 31 pg (ref 26.0–34.0)
MCHC: 34.7 g/dL (ref 30.0–36.0)
MCV: 89.3 fL (ref 80.0–100.0)
Platelet Count: 240 K/uL (ref 150–400)
RBC: 5.35 MIL/uL (ref 4.22–5.81)
RDW: 14.5 % (ref 11.5–15.5)
WBC Count: 12.6 K/uL — ABNORMAL HIGH (ref 4.0–10.5)
nRBC: 0 % (ref 0.0–0.2)

## 2024-11-13 LAB — IRON AND TIBC
Iron: 168 ug/dL (ref 45–182)
Saturation Ratios: 49 % — ABNORMAL HIGH (ref 17.9–39.5)
TIBC: 340 ug/dL (ref 250–450)
UIBC: 172 ug/dL

## 2024-11-13 LAB — FERRITIN: Ferritin: 237 ng/mL (ref 24–336)

## 2024-11-13 NOTE — Progress Notes (Unsigned)
 [facility]  Telephone:(336) [phone] Fax:(336) [phone]  ID: Carl Burke OB: [birth year]  MR#: [id number]  RDW#:[id number]  Patient Care Team: Sowles, Krichna, MD as PCP - General (Family Medicine) Jacobo Carl PARAS, MD as Consulting Physician (Oncology)  CHIEF COMPLAINT: Leukocytosis, polycythemia.  INTERVAL HISTORY: Patient is a 68 year old male last evaluated in clinic in November 2021.  He is referred back for persistent leukocytosis and polycythemia.  He currently feels well and is asymptomatic.  He has no neurologic complaints.  He denies any recent fevers or illnesses.  He has a good appetite and denies weight.  He has no new medications.  He has no chest pain, shortness of breath, cough, or hemoptysis.  He denies any nausea, vomiting, constipation, diarrhea.  He has no urinary complaints.  Patient offers no specific complaints today.  REVIEW OF SYSTEMS:   Review of Systems  Constitutional: Negative.  Negative for fever, malaise/fatigue and weight loss.  Respiratory: Negative.  Negative for cough, hemoptysis and shortness of breath.   Cardiovascular: Negative.  Negative for chest pain and leg swelling.  Gastrointestinal: Negative.  Negative for abdominal pain.  Genitourinary: Negative.   Musculoskeletal: Negative.  Negative for back pain.  Skin: Negative.  Negative for rash.  Neurological: Negative.  Negative for dizziness, focal weakness, weakness and headaches.  Psychiatric/Behavioral: Negative.  The patient is not nervous/anxious.     As per HPI. Otherwise, a complete review of systems is negative.  PAST MEDICAL HISTORY: Past Medical History:  Diagnosis Date   Acute blood loss anemia 07/17/2016   Acute duodenal ulcer with bleeding 07/17/2016   Arthritis    Asthma    Colon cancer screening    Complex sclerosing lesion of left breast    Complication of anesthesia    agitated and combative   Diabetes mellitus type 2, diet-controlled (HCC)  07/18/2023   Duodenal ulcer due to bacteria    Helicobacter pylori gastritis    Rx 1) PPI pantoprazole  40 mg qd x 2 mos 2) Pepto Bismol 2 tabs (262 mg each) 4 times a day x 14 d 3) Metronidazole  250 mg 4 times a day x 14 d 4) doxycycline  100 mg 2 times a day x 14 d   In 4 weeks after treatment completed do H. Pylori stool antigen - dx H. Pylori gastritis    Hyperlipidemia    Hypertension    Long term prescription opiate use 04/12/2016   Lumbosacral radiculopathy at S1 (Left) 04/12/2016   Opiate use 04/12/2016   Opiate use (300 MME/Day) 04/12/2016   Fentanyl  patch 100 g per hour every 72 hours + oxycodone /APAP 10/325 one every 6 hours (40 mg/day).    Pneumonia    Seasonal affective disorder 01/19/2021   Sleep apnea (no nocturnal PAP therapy)     PAST SURGICAL HISTORY: Past Surgical History:  Procedure Laterality Date   BREAST BIOPSY Left 03/14/2024   US  LT BREAST BX W LOC DEV 1ST LESION IMG BX SPEC US  GUIDE 03/14/2024 [facility]   COLONOSCOPY     COLONOSCOPY WITH PROPOFOL  N/A 08/23/2018   Procedure: COLONOSCOPY WITH PROPOFOL ;  Surgeon: Unk Corinn Skiff, MD;  Location: [facility] ENDOSCOPY;  Service: Gastroenterology;  Laterality: N/A;   COLONOSCOPY WITH PROPOFOL  N/A 08/24/2023   Procedure: COLONOSCOPY WITH PROPOFOL ;  Surgeon: Unk Corinn Skiff, MD;  Location: [facility] ENDOSCOPY;  Service: Gastroenterology;  Laterality: N/A;   ESOPHAGOGASTRODUODENOSCOPY N/A 07/19/2016   Procedure: ESOPHAGOGASTRODUODENOSCOPY (EGD);  Surgeon: Lupita FORBES Commander, MD;  Location: [facility] ENDOSCOPY;  Service: Endoscopy;  Laterality:  N/A;   ESOPHAGOGASTRODUODENOSCOPY (EGD) WITH PROPOFOL  N/A 08/23/2018   Procedure: ESOPHAGOGASTRODUODENOSCOPY (EGD) WITH PROPOFOL ;  Surgeon: Unk Corinn Skiff, MD;  Location: [facility] ENDOSCOPY;  Service: Gastroenterology;  Laterality: N/A;   POLYPECTOMY  08/24/2023   Procedure: POLYPECTOMY;  Surgeon: Unk Corinn Skiff, MD;  Location: [facility] ENDOSCOPY;  Service: Gastroenterology;;   RE-EXCISION OF  BREAST LUMPECTOMY Left 04/04/2024   Procedure: EXCISION, LESION, BREAST;  Surgeon: Tye Millet, DO;  Location: [facility] ORS;  Service: General;  Laterality: Left;   ROTATOR CUFF REPAIR Left 2011   ROTATOR CUFF REPAIR Right 2013    FAMILY HISTORY: Family History  Problem Relation Age of Onset   Heart disease Mother    Colon cancer Father    Cancer Sister    Breast cancer Sister 41   Cancer Sister     ADVANCED DIRECTIVES (Y/N):  Y  HEALTH MAINTENANCE: Social History   Tobacco Use   Smoking status: Some Days    Current packs/day: 0.00    Average packs/day: 0.5 packs/day for 51.4 years (25.7 ttl pk-yrs)    Types: Cigarettes    Start date: 09/30/1970    Last attempt to quit: 03/12/2022    Years since quitting: 2.6   Smokeless tobacco: Never  Vaping Use   Vaping status: Never Used  Substance Use Topics   Alcohol use: Yes    Alcohol/week: 0.0 standard drinks of alcohol    Comment: Rare/occassional    Drug use: No     Colonoscopy:  PAP:  Bone density:  Lipid panel:  Allergies  Allergen Reactions   Naproxen Sodium Rash and Other (See Comments)    800 mg tablets that the MD prescribed for pain.    Current Outpatient Medications  Medication Sig Dispense Refill   acetaminophen  (TYLENOL ) 500 MG tablet Take 1,000 mg by mouth every 8 (eight) hours as needed for moderate pain (pain score 4-6).     amLODipine -benazepril  (LOTREL) 5-20 MG capsule Take 1 capsule by mouth daily. 90 capsule 1   atorvastatin  (LIPITOR) 40 MG tablet Take 1 tablet (40 mg total) by mouth daily. 90 tablet 1   azelastine  (ASTELIN ) 0.1 % nasal spray Place 1 spray into both nostrils 2 (two) times daily as needed for allergies or rhinitis. Use in each nostril as directed     Fluticasone -Umeclidin-Vilant (TRELEGY ELLIPTA ) 100-62.5-25 MCG/ACT AEPB INHALE 1 PUFF EVERY DAY 180 each 0   ibuprofen  (ADVIL ) 200 MG tablet Take 400 mg by mouth every 8 (eight) hours as needed for moderate pain (pain score 4-6).      loratadine  (CLARITIN ) 10 MG tablet Take 1 tablet (10 mg total) by mouth daily as needed for allergies.     magnesium (MAGTAB) 84 MG ( ) TBCR SR tablet Take 84 mg by mouth.     Thiamine  HCl (VITAMIN B-1) 250 MG tablet Take 250 mg by mouth daily.     tiZANidine  (ZANAFLEX ) 4 MG tablet Take 1 tablet (4 mg total) by mouth 3 (three) times daily. 270 tablet 0   zolpidem  (AMBIEN ) 10 MG tablet TAKE 1 TABLET AT BEDTIME AS NEEDED FOR SLEEP 90 tablet 0   No current facility-administered medications for this visit.    OBJECTIVE: Vitals:   11/13/24 1115  BP: 139/60  Pulse: 76  Resp: 18  Temp: (!) 97.2 F (36.2 C)  SpO2: 97%     Body mass index is 40.45 kg/m.    ECOG FS:0 - Asymptomatic  General: Well-developed, well-nourished, no acute distress. Eyes: Pink conjunctiva, anicteric sclera. HEENT:  Normocephalic, moist mucous membranes. Lungs: No audible wheezing or coughing. Heart: Regular rate and rhythm. Abdomen: Soft, nontender, no obvious distention. Musculoskeletal: No edema, cyanosis, or clubbing. Neuro: Alert, answering all questions appropriately. Cranial nerves grossly intact. Skin: No rashes or petechiae noted. Psych: Normal affect. Lymphatics: No cervical, calvicular, axillary or inguinal LAD.   LAB RESULTS:  Lab Results  Component Value Date   NA 139 10/24/2024   K 4.3 10/24/2024   CL 102 10/24/2024   CO2 25 10/24/2024   GLUCOSE 100 (H) 10/24/2024   BUN 16 10/24/2024   CREATININE 0.74 10/24/2024   CALCIUM  9.7 10/24/2024   PROT 7.0 10/24/2024   ALBUMIN 3.8 03/09/2024   AST 33 10/24/2024   ALT 42 10/24/2024   ALKPHOS 63 03/09/2024   BILITOT 0.6 10/24/2024   GFRNONAA >60 03/09/2024   GFRAA 108 10/06/2020    Lab Results  Component Value Date   WBC 12.6 (H) 11/13/2024   NEUTROABS 9,972 (H) 10/24/2024   HGB 16.6 11/13/2024   HCT 47.8 11/13/2024   MCV 89.3 11/13/2024   PLT 240 11/13/2024     STUDIES: No results found.  ASSESSMENT: Leukocytosis,  polycythemia.  PLAN:    Leukocytosis: Chronic and unchanged.  Patient has had a persistently elevated total white blood cell count since at least May 2017 ranging between 9.5 and 16.4.  Most recent result was 12.6.  Previous workup was unrevealing.  Iron panel, B12 and folate are within normal limits.  Repeat peripheral blood flow cytometry is pending at time of dictation.  Previously, BCR-ABL was negative.  No intervention is needed at this time.  Patient does not require bone marrow biopsy.  He will have a video-assisted telemedicine visit in 3 weeks to discuss the results. Polycythemia: Resolved.  Patient's hemoglobin is 16.6.  Laboratory work as above.  Carbon monoxide level and JAK2 mutation with reflex are pending at time of dictation.  No further intervention is needed.  Follow-up as above.  I spent a total of 45 minutes reviewing chart data, face-to-face evaluation with the patient, counseling and coordination of care as detailed above.   Patient expressed understanding and was in agreement with this plan. He also understands that He can call clinic at any time with any questions, concerns, or complaints.    Carl JINNY Reusing, MD   11/14/2024 4:38 PM

## 2024-11-14 LAB — CARBON MONOXIDE, BLOOD (PERFORMED AT REF LAB): Carbon Monoxide, Blood: 3.7 % — ABNORMAL HIGH (ref 0.0–3.6)

## 2024-11-14 LAB — ERYTHROPOIETIN: Erythropoietin: 12.5 m[IU]/mL (ref 2.6–18.5)

## 2024-11-15 LAB — COMP PANEL: LEUKEMIA/LYMPHOMA

## 2024-11-26 LAB — JAK2 V617F RFX CALR/MPL/E12-15

## 2024-11-26 LAB — CALR +MPL + E12-E15  (REFLEX)

## 2024-12-13 ENCOUNTER — Inpatient Hospital Stay: Admitting: Oncology

## 2024-12-20 ENCOUNTER — Inpatient Hospital Stay: Attending: Oncology | Admitting: Oncology

## 2024-12-20 DIAGNOSIS — D72829 Elevated white blood cell count, unspecified: Secondary | ICD-10-CM

## 2024-12-20 NOTE — Progress Notes (Signed)
 " [facility]  [phone] [phone]) [phone]  ID: Carl Burke OB: October 31, 1956  MR#: [id number]  RDW#:[id number]  Patient Care Team: Sowles, Krichna, MD as PCP - General (Family Medicine) Jacobo Evalene PARAS, MD as Consulting Physician (Oncology)  I connected with Carl Burke on 12/20/2024 at  9:45 AM EST by video enabled telemedicine visit and verified that I am speaking with the correct person using two identifiers.   I discussed the limitations, risks, security and privacy concerns of performing an evaluation and management service by telemedicine and the availability of in-person appointments. I also discussed with the patient that there may be a patient responsible charge related to this service. The patient expressed understanding and agreed to proceed.   Other persons participating in the visit and their role in the encounter: Patient, MD.  Patients location: Home. Providers location: Clinic.  CHIEF COMPLAINT: Leukocytosis, polycythemia.  INTERVAL HISTORY: Patient agreed to video-assisted telemedicine visit for further evaluation and discussion of his laboratory results.  He continues to feel well and remains asymptomatic.  He has no neurologic complaints.  He denies any recent fevers or illnesses.  He has a good appetite and denies weight.  He has no chest pain, shortness of breath, cough, or hemoptysis.  He denies any nausea, vomiting, constipation, diarrhea.  He has no urinary complaints.  Patient offers no specific complaints today.  REVIEW OF SYSTEMS:   Review of Systems  Constitutional: Negative.  Negative for fever, malaise/fatigue and weight loss.  Respiratory: Negative.  Negative for cough, hemoptysis and shortness of breath.   Cardiovascular: Negative.  Negative for chest pain and leg swelling.  Gastrointestinal: Negative.  Negative for abdominal pain.  Genitourinary: Negative.   Musculoskeletal: Negative.  Negative for back pain.   Skin: Negative.  Negative for rash.  Neurological: Negative.  Negative for dizziness, focal weakness, weakness and headaches.  Psychiatric/Behavioral: Negative.  The patient is not nervous/anxious.     As per HPI. Otherwise, a complete review of systems is negative.  PAST MEDICAL HISTORY: Past Medical History:  Diagnosis Date   Acute blood loss anemia 07/17/2016   Acute duodenal ulcer with bleeding 07/17/2016   Arthritis    Asthma    Colon cancer screening    Complex sclerosing lesion of left breast    Complication of anesthesia    agitated and combative   Diabetes mellitus type 2, diet-controlled (HCC) 07/18/2023   Duodenal ulcer due to bacteria    Helicobacter pylori gastritis    Rx 1) PPI pantoprazole  40 mg qd x 2 mos 2) Pepto Bismol 2 tabs (262 mg each) 4 times a day x 14 d 3) Metronidazole  250 mg 4 times a day x 14 d 4) doxycycline  100 mg 2 times a day x 14 d   In 4 weeks after treatment completed do H. Pylori stool antigen - dx H. Pylori gastritis    Hyperlipidemia    Hypertension    Long term prescription opiate use 04/12/2016   Lumbosacral radiculopathy at S1 (Left) 04/12/2016   Opiate use 04/12/2016   Opiate use (300 MME/Day) 04/12/2016   Fentanyl  patch 100 g per hour every 72 hours + oxycodone /APAP 10/325 one every 6 hours (40 mg/day).    Pneumonia    Seasonal affective disorder 01/19/2021   Sleep apnea (no nocturnal PAP therapy)     PAST SURGICAL HISTORY: Past Surgical History:  Procedure Laterality Date   BREAST BIOPSY Left 03/14/2024   US  LT BREAST BX W LOC DEV 1ST LESION  IMG BX SPEC US  GUIDE 03/14/2024 [facility]   COLONOSCOPY     COLONOSCOPY WITH PROPOFOL  N/A 08/23/2018   Procedure: COLONOSCOPY WITH PROPOFOL ;  Surgeon: Unk Corinn Skiff, MD;  Location: [facility] ENDOSCOPY;  Service: Gastroenterology;  Laterality: N/A;   COLONOSCOPY WITH PROPOFOL  N/A 08/24/2023   Procedure: COLONOSCOPY WITH PROPOFOL ;  Surgeon: Unk Corinn Skiff, MD;  Location: [facility]  ENDOSCOPY;  Service: Gastroenterology;  Laterality: N/A;   ESOPHAGOGASTRODUODENOSCOPY N/A 07/19/2016   Procedure: ESOPHAGOGASTRODUODENOSCOPY (EGD);  Surgeon: Lupita FORBES Commander, MD;  Location: [facility] ENDOSCOPY;  Service: Endoscopy;  Laterality: N/A;   ESOPHAGOGASTRODUODENOSCOPY (EGD) WITH PROPOFOL  N/A 08/23/2018   Procedure: ESOPHAGOGASTRODUODENOSCOPY (EGD) WITH PROPOFOL ;  Surgeon: Unk Corinn Skiff, MD;  Location: [facility] ENDOSCOPY;  Service: Gastroenterology;  Laterality: N/A;   POLYPECTOMY  08/24/2023   Procedure: POLYPECTOMY;  Surgeon: Unk Corinn Skiff, MD;  Location: [facility] ENDOSCOPY;  Service: Gastroenterology;;   RE-EXCISION OF BREAST LUMPECTOMY Left 04/04/2024   Procedure: EXCISION, LESION, BREAST;  Surgeon: Tye Millet, DO;  Location: [facility] ORS;  Service: General;  Laterality: Left;   ROTATOR CUFF REPAIR Left 2011   ROTATOR CUFF REPAIR Right 2013    FAMILY HISTORY: Family History  Problem Relation Age of Onset   Heart disease Mother    Colon cancer Father    Cancer Sister    Breast cancer Sister 30   Cancer Sister     ADVANCED DIRECTIVES (Y/N):  Y  HEALTH MAINTENANCE: Social History   Tobacco Use   Smoking status: Some Days    Current packs/day: 0.00    Average packs/day: 0.5 packs/day for 51.4 years (25.7 ttl pk-yrs)    Types: Cigarettes    Start date: 09/30/1970    Last attempt to quit: 03/12/2022    Years since quitting: 2.7   Smokeless tobacco: Never  Vaping Use   Vaping status: Never Used  Substance Use Topics   Alcohol use: Yes    Alcohol/week: 0.0 standard drinks of alcohol    Comment: Rare/occassional    Drug use: No     Colonoscopy:  PAP:  Bone density:  Lipid panel:  Allergies  Allergen Reactions   Naproxen Sodium Rash and Other (See Comments)    800 mg tablets that the MD prescribed for pain.    Current Outpatient Medications  Medication Sig Dispense Refill   acetaminophen  (TYLENOL ) 500 MG tablet Take 1,000 mg by mouth every 8 (eight) hours as needed  for moderate pain (pain score 4-6).     amLODipine -benazepril  (LOTREL) 5-20 MG capsule Take 1 capsule by mouth daily. 90 capsule 1   atorvastatin  (LIPITOR) 40 MG tablet Take 1 tablet (40 mg total) by mouth daily. 90 tablet 1   azelastine  (ASTELIN ) 0.1 % nasal spray Place 1 spray into both nostrils 2 (two) times daily as needed for allergies or rhinitis. Use in each nostril as directed     Fluticasone -Umeclidin-Vilant (TRELEGY ELLIPTA ) 100-62.5-25 MCG/ACT AEPB INHALE 1 PUFF EVERY DAY 180 each 0   ibuprofen  (ADVIL ) 200 MG tablet Take 400 mg by mouth every 8 (eight) hours as needed for moderate pain (pain score 4-6).     loratadine  (CLARITIN ) 10 MG tablet Take 1 tablet (10 mg total) by mouth daily as needed for allergies.     magnesium (MAGTAB) 84 MG ( ) TBCR SR tablet Take 84 mg by mouth.     Thiamine  HCl (VITAMIN B-1) 250 MG tablet Take 250 mg by mouth daily.     tiZANidine  (ZANAFLEX ) 4 MG tablet Take 1 tablet (4 mg total)  by mouth 3 (three) times daily. 270 tablet 0   zolpidem  (AMBIEN ) 10 MG tablet TAKE 1 TABLET AT BEDTIME AS NEEDED FOR SLEEP 90 tablet 0   No current facility-administered medications for this visit.    OBJECTIVE: There were no vitals filed for this visit.    There is no height or weight on file to calculate BMI.    ECOG FS:0 - Asymptomatic  General: Well-developed, well-nourished, no acute distress. HEENT: Normocephalic. Neuro: Alert, answering all questions appropriately. Cranial nerves grossly intact. Psych: Normal affect.  LAB RESULTS:  Lab Results  Component Value Date   NA 139 10/24/2024   K 4.3 10/24/2024   CL 102 10/24/2024   CO2 25 10/24/2024   GLUCOSE 100 (H) 10/24/2024   BUN 16 10/24/2024   CREATININE 0.74 10/24/2024   CALCIUM  9.7 10/24/2024   PROT 7.0 10/24/2024   ALBUMIN 3.8 03/09/2024   AST 33 10/24/2024   ALT 42 10/24/2024   ALKPHOS 63 03/09/2024   BILITOT 0.6 10/24/2024   GFRNONAA >60 03/09/2024   GFRAA 108 10/06/2020    Lab Results   Component Value Date   WBC 12.6 (H) 11/13/2024   NEUTROABS 9,972 (H) 10/24/2024   HGB 16.6 11/13/2024   HCT 47.8 11/13/2024   MCV 89.3 11/13/2024   PLT 240 11/13/2024     STUDIES: No results found.  ASSESSMENT: Leukocytosis, polycythemia.  PLAN:    Leukocytosis: Chronic and unchanged.  Patient has had a persistently elevated total white blood cell count since at least May 2017 ranging between 9.5 and 16.4.  Most recent result was 12.6.  Previous workup was unrevealing.  All of patient's laboratory work including iron panel, B12, folate, and peripheral blood flow cytometry were either negative or within normal limits.  Previously, BCR-ABL was negative.  No intervention is needed at this time.  Patient does not require bone marrow biopsy.  No further follow-up has been scheduled.   Polycythemia: Resolved.  Patient's hemoglobin is 16.6.  Laboratory work as above.  Jak mutation with reflex is also negative.  Carbon monoxide levels are mildly elevated which is likely secondary to patient stating he smokes cigars occasionally.  No intervention is needed at this time.  No follow-up needed as above.  I provided 20 minutes of face-to-face video visit time during this encounter which included chart review, counseling, and coordination of care as documented above.     Patient expressed understanding and was in agreement with this plan. He also understands that He can call clinic at any time with any questions, concerns, or complaints.    Evalene JINNY Reusing, MD   12/20/2024 9:54 AM     "

## 2025-01-05 ENCOUNTER — Other Ambulatory Visit: Payer: Self-pay | Admitting: Family Medicine

## 2025-01-05 DIAGNOSIS — G894 Chronic pain syndrome: Secondary | ICD-10-CM

## 2025-01-06 NOTE — Telephone Encounter (Signed)
 Requested medication (s) are due for refill today: yes  Requested medication (s) are on the active medication list: yes  Last refill:  10/24/24  Future visit scheduled: yes  Notes to clinic:  Unable to refill per protocol, cannot delegate.      Requested Prescriptions  Pending Prescriptions Disp Refills   tiZANidine  (ZANAFLEX ) 4 MG tablet [Pharmacy Med Name: TIZANIDINE  HYDROCHLORIDE 4 MG Oral Tablet] 270 tablet 3    Sig: TAKE 1 TABLET THREE TIMES DAILY     Not Delegated - Cardiovascular:  Alpha-2 Agonists - tizanidine  Failed - 01/06/2025  4:20 PM      Failed - This refill cannot be delegated      Passed - Valid encounter within last 6 months    Recent Outpatient Visits           2 months ago Dyslipidemia associated with type 2 diabetes mellitus [facility]   [place] [facility] Glenard Mire, MD   8 months ago Dyslipidemia associated with type 2 diabetes mellitus [facility])   [facility] Glenard Mire, MD

## 2025-04-23 ENCOUNTER — Ambulatory Visit: Admitting: Family Medicine
